# Patient Record
Sex: Female | Born: 1940 | Race: White | Hispanic: No | Marital: Married | Smoking: Never smoker
Health system: Southern US, Community
[De-identification: ages and names within clinical notes are randomized; demographics above are authoritative.]

## PROBLEM LIST (undated history)

## (undated) DIAGNOSIS — M5126 Other intervertebral disc displacement, lumbar region: Secondary | ICD-10-CM

## (undated) DIAGNOSIS — E039 Hypothyroidism, unspecified: Secondary | ICD-10-CM

## (undated) DIAGNOSIS — M1990 Unspecified osteoarthritis, unspecified site: Secondary | ICD-10-CM

## (undated) DIAGNOSIS — M8580 Other specified disorders of bone density and structure, unspecified site: Secondary | ICD-10-CM

## (undated) DIAGNOSIS — D649 Anemia, unspecified: Secondary | ICD-10-CM

## (undated) DIAGNOSIS — M0520 Rheumatoid vasculitis with rheumatoid arthritis of unspecified site: Secondary | ICD-10-CM

## (undated) DIAGNOSIS — K219 Gastro-esophageal reflux disease without esophagitis: Secondary | ICD-10-CM

## (undated) DIAGNOSIS — J302 Other seasonal allergic rhinitis: Secondary | ICD-10-CM

## (undated) DIAGNOSIS — M797 Fibromyalgia: Secondary | ICD-10-CM

## (undated) DIAGNOSIS — M3500 Sicca syndrome, unspecified: Secondary | ICD-10-CM

## (undated) DIAGNOSIS — M549 Dorsalgia, unspecified: Secondary | ICD-10-CM

## (undated) DIAGNOSIS — F419 Anxiety disorder, unspecified: Secondary | ICD-10-CM

## (undated) HISTORY — DX: Dorsalgia, unspecified: M54.9

## (undated) HISTORY — DX: Hypothyroidism, unspecified: E03.9

## (undated) HISTORY — DX: Sjogren syndrome, unspecified: M35.00

## (undated) HISTORY — DX: Rheumatoid vasculitis with rheumatoid arthritis of unspecified site: M05.20

## (undated) HISTORY — DX: Anxiety disorder, unspecified: F41.9

## (undated) HISTORY — PX: ABDOMINAL HYSTERECTOMY: SHX81

## (undated) HISTORY — DX: Other seasonal allergic rhinitis: J30.2

## (undated) HISTORY — DX: Other specified disorders of bone density and structure, unspecified site: M85.80

## (undated) HISTORY — DX: Unspecified osteoarthritis, unspecified site: M19.90

## (undated) HISTORY — DX: Gastro-esophageal reflux disease without esophagitis: K21.9

## (undated) HISTORY — DX: Anemia, unspecified: D64.9

## (undated) HISTORY — DX: Fibromyalgia: M79.7

## (undated) HISTORY — PX: BLADDER SURGERY: SHX569

## (undated) HISTORY — DX: Other intervertebral disc displacement, lumbar region: M51.26

---

## 1999-07-02 ENCOUNTER — Encounter: Payer: Self-pay | Admitting: Internal Medicine

## 1999-07-02 ENCOUNTER — Emergency Department (HOSPITAL_COMMUNITY): Admission: EM | Admit: 1999-07-02 | Discharge: 1999-07-02 | Payer: Self-pay | Admitting: Emergency Medicine

## 1999-08-31 ENCOUNTER — Ambulatory Visit (HOSPITAL_COMMUNITY): Admission: RE | Admit: 1999-08-31 | Discharge: 1999-08-31 | Payer: Self-pay | Admitting: Family Medicine

## 1999-08-31 ENCOUNTER — Encounter: Payer: Self-pay | Admitting: Family Medicine

## 1999-10-04 ENCOUNTER — Ambulatory Visit (HOSPITAL_COMMUNITY): Admission: RE | Admit: 1999-10-04 | Discharge: 1999-10-04 | Payer: Self-pay | Admitting: Gastroenterology

## 2000-02-01 ENCOUNTER — Other Ambulatory Visit: Admission: RE | Admit: 2000-02-01 | Discharge: 2000-02-01 | Payer: Self-pay | Admitting: Gynecology

## 2004-03-16 ENCOUNTER — Other Ambulatory Visit: Admission: RE | Admit: 2004-03-16 | Discharge: 2004-03-16 | Payer: Self-pay | Admitting: Gynecology

## 2006-07-25 ENCOUNTER — Inpatient Hospital Stay (HOSPITAL_COMMUNITY): Admission: RE | Admit: 2006-07-25 | Discharge: 2006-07-26 | Payer: Self-pay | Admitting: Obstetrics and Gynecology

## 2006-08-23 ENCOUNTER — Inpatient Hospital Stay (HOSPITAL_COMMUNITY): Admission: AD | Admit: 2006-08-23 | Discharge: 2006-08-23 | Payer: Self-pay | Admitting: Obstetrics and Gynecology

## 2011-07-06 DIAGNOSIS — M069 Rheumatoid arthritis, unspecified: Secondary | ICD-10-CM | POA: Diagnosis not present

## 2011-07-06 DIAGNOSIS — M3500 Sicca syndrome, unspecified: Secondary | ICD-10-CM | POA: Diagnosis not present

## 2011-07-06 DIAGNOSIS — I7300 Raynaud's syndrome without gangrene: Secondary | ICD-10-CM | POA: Diagnosis not present

## 2011-07-06 DIAGNOSIS — M25569 Pain in unspecified knee: Secondary | ICD-10-CM | POA: Diagnosis not present

## 2011-07-06 DIAGNOSIS — Z79899 Other long term (current) drug therapy: Secondary | ICD-10-CM | POA: Diagnosis not present

## 2011-11-10 DIAGNOSIS — M3500 Sicca syndrome, unspecified: Secondary | ICD-10-CM | POA: Diagnosis not present

## 2011-11-10 DIAGNOSIS — M069 Rheumatoid arthritis, unspecified: Secondary | ICD-10-CM | POA: Diagnosis not present

## 2011-11-10 DIAGNOSIS — M25569 Pain in unspecified knee: Secondary | ICD-10-CM | POA: Diagnosis not present

## 2011-11-10 DIAGNOSIS — I7300 Raynaud's syndrome without gangrene: Secondary | ICD-10-CM | POA: Diagnosis not present

## 2011-12-15 DIAGNOSIS — E039 Hypothyroidism, unspecified: Secondary | ICD-10-CM | POA: Diagnosis not present

## 2011-12-15 DIAGNOSIS — J45909 Unspecified asthma, uncomplicated: Secondary | ICD-10-CM | POA: Diagnosis not present

## 2011-12-15 DIAGNOSIS — F411 Generalized anxiety disorder: Secondary | ICD-10-CM | POA: Diagnosis not present

## 2012-01-16 DIAGNOSIS — Z1212 Encounter for screening for malignant neoplasm of rectum: Secondary | ICD-10-CM | POA: Diagnosis not present

## 2012-01-16 DIAGNOSIS — N952 Postmenopausal atrophic vaginitis: Secondary | ICD-10-CM | POA: Diagnosis not present

## 2012-01-16 DIAGNOSIS — N951 Menopausal and female climacteric states: Secondary | ICD-10-CM | POA: Diagnosis not present

## 2012-01-17 DIAGNOSIS — M069 Rheumatoid arthritis, unspecified: Secondary | ICD-10-CM | POA: Diagnosis not present

## 2012-03-15 DIAGNOSIS — H5710 Ocular pain, unspecified eye: Secondary | ICD-10-CM | POA: Diagnosis not present

## 2012-03-15 DIAGNOSIS — Z23 Encounter for immunization: Secondary | ICD-10-CM | POA: Diagnosis not present

## 2012-03-15 DIAGNOSIS — M316 Other giant cell arteritis: Secondary | ICD-10-CM | POA: Diagnosis not present

## 2012-03-20 DIAGNOSIS — H2000 Unspecified acute and subacute iridocyclitis: Secondary | ICD-10-CM | POA: Diagnosis not present

## 2012-04-03 DIAGNOSIS — H16009 Unspecified corneal ulcer, unspecified eye: Secondary | ICD-10-CM | POA: Diagnosis not present

## 2012-04-06 DIAGNOSIS — H16009 Unspecified corneal ulcer, unspecified eye: Secondary | ICD-10-CM | POA: Diagnosis not present

## 2012-06-04 DIAGNOSIS — Z79899 Other long term (current) drug therapy: Secondary | ICD-10-CM | POA: Diagnosis not present

## 2012-06-15 DIAGNOSIS — G4700 Insomnia, unspecified: Secondary | ICD-10-CM | POA: Diagnosis not present

## 2012-06-15 DIAGNOSIS — R7309 Other abnormal glucose: Secondary | ICD-10-CM | POA: Diagnosis not present

## 2012-09-06 DIAGNOSIS — M3500 Sicca syndrome, unspecified: Secondary | ICD-10-CM | POA: Diagnosis not present

## 2012-09-06 DIAGNOSIS — M25569 Pain in unspecified knee: Secondary | ICD-10-CM | POA: Diagnosis not present

## 2012-09-06 DIAGNOSIS — M069 Rheumatoid arthritis, unspecified: Secondary | ICD-10-CM | POA: Diagnosis not present

## 2012-09-06 DIAGNOSIS — Z79899 Other long term (current) drug therapy: Secondary | ICD-10-CM | POA: Diagnosis not present

## 2012-09-06 DIAGNOSIS — Z09 Encounter for follow-up examination after completed treatment for conditions other than malignant neoplasm: Secondary | ICD-10-CM | POA: Diagnosis not present

## 2012-09-24 DIAGNOSIS — K089 Disorder of teeth and supporting structures, unspecified: Secondary | ICD-10-CM | POA: Diagnosis not present

## 2012-09-24 DIAGNOSIS — J329 Chronic sinusitis, unspecified: Secondary | ICD-10-CM | POA: Diagnosis not present

## 2012-09-24 DIAGNOSIS — R51 Headache: Secondary | ICD-10-CM | POA: Diagnosis not present

## 2012-09-28 DIAGNOSIS — Z79899 Other long term (current) drug therapy: Secondary | ICD-10-CM | POA: Diagnosis not present

## 2012-09-28 DIAGNOSIS — H04129 Dry eye syndrome of unspecified lacrimal gland: Secondary | ICD-10-CM | POA: Diagnosis not present

## 2012-09-28 DIAGNOSIS — M069 Rheumatoid arthritis, unspecified: Secondary | ICD-10-CM | POA: Diagnosis not present

## 2012-10-29 DIAGNOSIS — H2510 Age-related nuclear cataract, unspecified eye: Secondary | ICD-10-CM | POA: Diagnosis not present

## 2012-10-29 DIAGNOSIS — H16149 Punctate keratitis, unspecified eye: Secondary | ICD-10-CM | POA: Diagnosis not present

## 2012-10-29 DIAGNOSIS — H18459 Nodular corneal degeneration, unspecified eye: Secondary | ICD-10-CM | POA: Diagnosis not present

## 2012-10-29 DIAGNOSIS — H01009 Unspecified blepharitis unspecified eye, unspecified eyelid: Secondary | ICD-10-CM | POA: Diagnosis not present

## 2013-01-03 DIAGNOSIS — J209 Acute bronchitis, unspecified: Secondary | ICD-10-CM | POA: Diagnosis not present

## 2013-01-03 DIAGNOSIS — E039 Hypothyroidism, unspecified: Secondary | ICD-10-CM | POA: Diagnosis not present

## 2013-01-03 DIAGNOSIS — G4700 Insomnia, unspecified: Secondary | ICD-10-CM | POA: Diagnosis not present

## 2013-03-07 DIAGNOSIS — M3500 Sicca syndrome, unspecified: Secondary | ICD-10-CM | POA: Diagnosis not present

## 2013-03-07 DIAGNOSIS — Z79899 Other long term (current) drug therapy: Secondary | ICD-10-CM | POA: Diagnosis not present

## 2013-03-07 DIAGNOSIS — M069 Rheumatoid arthritis, unspecified: Secondary | ICD-10-CM | POA: Diagnosis not present

## 2013-03-07 DIAGNOSIS — M25569 Pain in unspecified knee: Secondary | ICD-10-CM | POA: Diagnosis not present

## 2013-03-07 DIAGNOSIS — M7660 Achilles tendinitis, unspecified leg: Secondary | ICD-10-CM | POA: Diagnosis not present

## 2013-05-16 DIAGNOSIS — Z79899 Other long term (current) drug therapy: Secondary | ICD-10-CM | POA: Diagnosis not present

## 2013-05-16 DIAGNOSIS — E039 Hypothyroidism, unspecified: Secondary | ICD-10-CM | POA: Diagnosis not present

## 2013-05-16 DIAGNOSIS — Z23 Encounter for immunization: Secondary | ICD-10-CM | POA: Diagnosis not present

## 2013-07-11 DIAGNOSIS — Z1212 Encounter for screening for malignant neoplasm of rectum: Secondary | ICD-10-CM | POA: Diagnosis not present

## 2013-07-11 DIAGNOSIS — Z1289 Encounter for screening for malignant neoplasm of other sites: Secondary | ICD-10-CM | POA: Diagnosis not present

## 2013-08-21 DIAGNOSIS — F411 Generalized anxiety disorder: Secondary | ICD-10-CM | POA: Diagnosis not present

## 2013-08-21 DIAGNOSIS — E559 Vitamin D deficiency, unspecified: Secondary | ICD-10-CM | POA: Diagnosis not present

## 2013-08-21 DIAGNOSIS — K2100 Gastro-esophageal reflux disease with esophagitis, without bleeding: Secondary | ICD-10-CM | POA: Diagnosis not present

## 2013-08-21 DIAGNOSIS — E039 Hypothyroidism, unspecified: Secondary | ICD-10-CM | POA: Diagnosis not present

## 2013-08-21 DIAGNOSIS — J45909 Unspecified asthma, uncomplicated: Secondary | ICD-10-CM | POA: Diagnosis not present

## 2013-09-12 DIAGNOSIS — L570 Actinic keratosis: Secondary | ICD-10-CM | POA: Diagnosis not present

## 2013-09-12 DIAGNOSIS — D1739 Benign lipomatous neoplasm of skin and subcutaneous tissue of other sites: Secondary | ICD-10-CM | POA: Diagnosis not present

## 2013-09-12 DIAGNOSIS — L821 Other seborrheic keratosis: Secondary | ICD-10-CM | POA: Diagnosis not present

## 2013-12-23 DIAGNOSIS — S8010XA Contusion of unspecified lower leg, initial encounter: Secondary | ICD-10-CM | POA: Diagnosis not present

## 2014-02-12 DIAGNOSIS — E782 Mixed hyperlipidemia: Secondary | ICD-10-CM | POA: Diagnosis not present

## 2014-02-12 DIAGNOSIS — E559 Vitamin D deficiency, unspecified: Secondary | ICD-10-CM | POA: Diagnosis not present

## 2014-02-12 DIAGNOSIS — E039 Hypothyroidism, unspecified: Secondary | ICD-10-CM | POA: Diagnosis not present

## 2014-02-12 DIAGNOSIS — Z79899 Other long term (current) drug therapy: Secondary | ICD-10-CM | POA: Diagnosis not present

## 2014-02-14 DIAGNOSIS — F411 Generalized anxiety disorder: Secondary | ICD-10-CM | POA: Diagnosis not present

## 2014-02-14 DIAGNOSIS — J45909 Unspecified asthma, uncomplicated: Secondary | ICD-10-CM | POA: Diagnosis not present

## 2014-02-14 DIAGNOSIS — K219 Gastro-esophageal reflux disease without esophagitis: Secondary | ICD-10-CM | POA: Diagnosis not present

## 2014-02-14 DIAGNOSIS — E039 Hypothyroidism, unspecified: Secondary | ICD-10-CM | POA: Diagnosis not present

## 2014-03-27 DIAGNOSIS — H11009 Unspecified pterygium of unspecified eye: Secondary | ICD-10-CM | POA: Diagnosis not present

## 2014-03-27 DIAGNOSIS — Z09 Encounter for follow-up examination after completed treatment for conditions other than malignant neoplasm: Secondary | ICD-10-CM | POA: Diagnosis not present

## 2014-03-27 DIAGNOSIS — H2510 Age-related nuclear cataract, unspecified eye: Secondary | ICD-10-CM | POA: Diagnosis not present

## 2014-03-27 DIAGNOSIS — Z049 Encounter for examination and observation for unspecified reason: Secondary | ICD-10-CM | POA: Diagnosis not present

## 2014-03-27 DIAGNOSIS — M069 Rheumatoid arthritis, unspecified: Secondary | ICD-10-CM | POA: Diagnosis not present

## 2014-04-30 DIAGNOSIS — Z23 Encounter for immunization: Secondary | ICD-10-CM | POA: Diagnosis not present

## 2014-07-31 DIAGNOSIS — M19041 Primary osteoarthritis, right hand: Secondary | ICD-10-CM | POA: Diagnosis not present

## 2014-07-31 DIAGNOSIS — R5381 Other malaise: Secondary | ICD-10-CM | POA: Diagnosis not present

## 2014-07-31 DIAGNOSIS — Z79899 Other long term (current) drug therapy: Secondary | ICD-10-CM | POA: Diagnosis not present

## 2014-07-31 DIAGNOSIS — R300 Dysuria: Secondary | ICD-10-CM | POA: Diagnosis not present

## 2014-07-31 DIAGNOSIS — M19071 Primary osteoarthritis, right ankle and foot: Secondary | ICD-10-CM | POA: Diagnosis not present

## 2014-07-31 DIAGNOSIS — M19072 Primary osteoarthritis, left ankle and foot: Secondary | ICD-10-CM | POA: Diagnosis not present

## 2014-07-31 DIAGNOSIS — M2550 Pain in unspecified joint: Secondary | ICD-10-CM | POA: Diagnosis not present

## 2014-07-31 DIAGNOSIS — M069 Rheumatoid arthritis, unspecified: Secondary | ICD-10-CM | POA: Diagnosis not present

## 2014-07-31 DIAGNOSIS — M19042 Primary osteoarthritis, left hand: Secondary | ICD-10-CM | POA: Diagnosis not present

## 2014-07-31 DIAGNOSIS — E559 Vitamin D deficiency, unspecified: Secondary | ICD-10-CM | POA: Diagnosis not present

## 2014-07-31 DIAGNOSIS — M3500 Sicca syndrome, unspecified: Secondary | ICD-10-CM | POA: Diagnosis not present

## 2014-09-09 DIAGNOSIS — E782 Mixed hyperlipidemia: Secondary | ICD-10-CM | POA: Diagnosis not present

## 2014-09-09 DIAGNOSIS — Z79899 Other long term (current) drug therapy: Secondary | ICD-10-CM | POA: Diagnosis not present

## 2014-11-07 DIAGNOSIS — M069 Rheumatoid arthritis, unspecified: Secondary | ICD-10-CM | POA: Diagnosis not present

## 2014-11-07 DIAGNOSIS — J4530 Mild persistent asthma, uncomplicated: Secondary | ICD-10-CM | POA: Diagnosis not present

## 2014-11-07 DIAGNOSIS — Z23 Encounter for immunization: Secondary | ICD-10-CM | POA: Diagnosis not present

## 2014-11-07 DIAGNOSIS — M3500 Sicca syndrome, unspecified: Secondary | ICD-10-CM | POA: Diagnosis not present

## 2014-11-07 DIAGNOSIS — Z0000 Encounter for general adult medical examination without abnormal findings: Secondary | ICD-10-CM | POA: Diagnosis not present

## 2014-11-07 DIAGNOSIS — I7300 Raynaud's syndrome without gangrene: Secondary | ICD-10-CM | POA: Diagnosis not present

## 2014-11-07 DIAGNOSIS — E1165 Type 2 diabetes mellitus with hyperglycemia: Secondary | ICD-10-CM | POA: Diagnosis not present

## 2015-01-01 DIAGNOSIS — M0579 Rheumatoid arthritis with rheumatoid factor of multiple sites without organ or systems involvement: Secondary | ICD-10-CM | POA: Diagnosis not present

## 2015-01-01 DIAGNOSIS — M797 Fibromyalgia: Secondary | ICD-10-CM | POA: Diagnosis not present

## 2015-01-01 DIAGNOSIS — Z09 Encounter for follow-up examination after completed treatment for conditions other than malignant neoplasm: Secondary | ICD-10-CM | POA: Diagnosis not present

## 2015-01-01 DIAGNOSIS — Z79899 Other long term (current) drug therapy: Secondary | ICD-10-CM | POA: Diagnosis not present

## 2015-01-01 DIAGNOSIS — M3500 Sicca syndrome, unspecified: Secondary | ICD-10-CM | POA: Diagnosis not present

## 2015-02-18 DIAGNOSIS — Z09 Encounter for follow-up examination after completed treatment for conditions other than malignant neoplasm: Secondary | ICD-10-CM | POA: Diagnosis not present

## 2015-02-18 DIAGNOSIS — Z049 Encounter for examination and observation for unspecified reason: Secondary | ICD-10-CM | POA: Diagnosis not present

## 2015-02-18 DIAGNOSIS — H1840 Unspecified corneal degeneration: Secondary | ICD-10-CM | POA: Diagnosis not present

## 2015-02-18 DIAGNOSIS — H18792 Other corneal deformities, left eye: Secondary | ICD-10-CM | POA: Diagnosis not present

## 2015-02-18 DIAGNOSIS — H2513 Age-related nuclear cataract, bilateral: Secondary | ICD-10-CM | POA: Diagnosis not present

## 2015-02-18 DIAGNOSIS — M0540 Rheumatoid myopathy with rheumatoid arthritis of unspecified site: Secondary | ICD-10-CM | POA: Diagnosis not present

## 2015-02-18 DIAGNOSIS — H04123 Dry eye syndrome of bilateral lacrimal glands: Secondary | ICD-10-CM | POA: Diagnosis not present

## 2015-04-23 DIAGNOSIS — Z23 Encounter for immunization: Secondary | ICD-10-CM | POA: Diagnosis not present

## 2015-06-04 DIAGNOSIS — Z79899 Other long term (current) drug therapy: Secondary | ICD-10-CM | POA: Diagnosis not present

## 2015-06-04 DIAGNOSIS — R5381 Other malaise: Secondary | ICD-10-CM | POA: Diagnosis not present

## 2015-06-04 DIAGNOSIS — R300 Dysuria: Secondary | ICD-10-CM | POA: Diagnosis not present

## 2015-06-04 DIAGNOSIS — M1712 Unilateral primary osteoarthritis, left knee: Secondary | ICD-10-CM | POA: Diagnosis not present

## 2015-06-04 DIAGNOSIS — R531 Weakness: Secondary | ICD-10-CM | POA: Diagnosis not present

## 2015-06-23 DIAGNOSIS — E039 Hypothyroidism, unspecified: Secondary | ICD-10-CM | POA: Diagnosis not present

## 2015-06-23 DIAGNOSIS — K219 Gastro-esophageal reflux disease without esophagitis: Secondary | ICD-10-CM | POA: Diagnosis not present

## 2015-06-23 DIAGNOSIS — G4700 Insomnia, unspecified: Secondary | ICD-10-CM | POA: Diagnosis not present

## 2015-06-23 DIAGNOSIS — F419 Anxiety disorder, unspecified: Secondary | ICD-10-CM | POA: Diagnosis not present

## 2015-07-08 DIAGNOSIS — H18453 Nodular corneal degeneration, bilateral: Secondary | ICD-10-CM | POA: Diagnosis not present

## 2015-07-08 DIAGNOSIS — H17822 Peripheral opacity of cornea, left eye: Secondary | ICD-10-CM | POA: Diagnosis not present

## 2015-07-08 DIAGNOSIS — H04123 Dry eye syndrome of bilateral lacrimal glands: Secondary | ICD-10-CM | POA: Diagnosis not present

## 2015-07-08 DIAGNOSIS — H2513 Age-related nuclear cataract, bilateral: Secondary | ICD-10-CM | POA: Diagnosis not present

## 2015-08-13 DIAGNOSIS — L820 Inflamed seborrheic keratosis: Secondary | ICD-10-CM | POA: Diagnosis not present

## 2015-08-13 DIAGNOSIS — L814 Other melanin hyperpigmentation: Secondary | ICD-10-CM | POA: Diagnosis not present

## 2015-08-13 DIAGNOSIS — D1801 Hemangioma of skin and subcutaneous tissue: Secondary | ICD-10-CM | POA: Diagnosis not present

## 2015-08-14 DIAGNOSIS — H18459 Nodular corneal degeneration, unspecified eye: Secondary | ICD-10-CM | POA: Diagnosis not present

## 2015-09-11 DIAGNOSIS — H18453 Nodular corneal degeneration, bilateral: Secondary | ICD-10-CM | POA: Diagnosis not present

## 2015-09-11 DIAGNOSIS — H01003 Unspecified blepharitis right eye, unspecified eyelid: Secondary | ICD-10-CM | POA: Diagnosis not present

## 2015-09-11 DIAGNOSIS — H25813 Combined forms of age-related cataract, bilateral: Secondary | ICD-10-CM | POA: Diagnosis not present

## 2015-09-11 DIAGNOSIS — H01006 Unspecified blepharitis left eye, unspecified eyelid: Secondary | ICD-10-CM | POA: Diagnosis not present

## 2015-09-11 DIAGNOSIS — H16121 Filamentary keratitis, right eye: Secondary | ICD-10-CM | POA: Diagnosis not present

## 2015-09-11 DIAGNOSIS — H04123 Dry eye syndrome of bilateral lacrimal glands: Secondary | ICD-10-CM | POA: Diagnosis not present

## 2015-11-11 DIAGNOSIS — M7661 Achilles tendinitis, right leg: Secondary | ICD-10-CM | POA: Diagnosis not present

## 2015-11-11 DIAGNOSIS — M0579 Rheumatoid arthritis with rheumatoid factor of multiple sites without organ or systems involvement: Secondary | ICD-10-CM | POA: Diagnosis not present

## 2015-11-11 DIAGNOSIS — Z09 Encounter for follow-up examination after completed treatment for conditions other than malignant neoplasm: Secondary | ICD-10-CM | POA: Diagnosis not present

## 2015-12-03 DIAGNOSIS — Z0000 Encounter for general adult medical examination without abnormal findings: Secondary | ICD-10-CM | POA: Diagnosis not present

## 2015-12-03 DIAGNOSIS — Z23 Encounter for immunization: Secondary | ICD-10-CM | POA: Diagnosis not present

## 2015-12-03 DIAGNOSIS — M069 Rheumatoid arthritis, unspecified: Secondary | ICD-10-CM | POA: Diagnosis not present

## 2016-01-11 DIAGNOSIS — N3091 Cystitis, unspecified with hematuria: Secondary | ICD-10-CM | POA: Diagnosis not present

## 2016-01-14 DIAGNOSIS — Z09 Encounter for follow-up examination after completed treatment for conditions other than malignant neoplasm: Secondary | ICD-10-CM | POA: Diagnosis not present

## 2016-01-14 DIAGNOSIS — M0579 Rheumatoid arthritis with rheumatoid factor of multiple sites without organ or systems involvement: Secondary | ICD-10-CM | POA: Diagnosis not present

## 2016-01-14 DIAGNOSIS — Z79899 Other long term (current) drug therapy: Secondary | ICD-10-CM | POA: Diagnosis not present

## 2016-01-14 DIAGNOSIS — M3509 Sicca syndrome with other organ involvement: Secondary | ICD-10-CM | POA: Diagnosis not present

## 2016-01-14 DIAGNOSIS — M79641 Pain in right hand: Secondary | ICD-10-CM | POA: Diagnosis not present

## 2016-01-18 DIAGNOSIS — R300 Dysuria: Secondary | ICD-10-CM | POA: Diagnosis not present

## 2016-01-18 DIAGNOSIS — N898 Other specified noninflammatory disorders of vagina: Secondary | ICD-10-CM | POA: Diagnosis not present

## 2016-01-18 DIAGNOSIS — N760 Acute vaginitis: Secondary | ICD-10-CM | POA: Diagnosis not present

## 2016-02-24 DIAGNOSIS — Z1231 Encounter for screening mammogram for malignant neoplasm of breast: Secondary | ICD-10-CM | POA: Diagnosis not present

## 2016-02-24 DIAGNOSIS — N905 Atrophy of vulva: Secondary | ICD-10-CM | POA: Diagnosis not present

## 2016-03-10 DIAGNOSIS — Z79899 Other long term (current) drug therapy: Secondary | ICD-10-CM | POA: Diagnosis not present

## 2016-03-10 DIAGNOSIS — I7300 Raynaud's syndrome without gangrene: Secondary | ICD-10-CM | POA: Diagnosis not present

## 2016-03-10 DIAGNOSIS — M797 Fibromyalgia: Secondary | ICD-10-CM | POA: Diagnosis not present

## 2016-03-10 DIAGNOSIS — M3509 Sicca syndrome with other organ involvement: Secondary | ICD-10-CM | POA: Diagnosis not present

## 2016-03-10 DIAGNOSIS — G629 Polyneuropathy, unspecified: Secondary | ICD-10-CM | POA: Diagnosis not present

## 2016-05-08 NOTE — Progress Notes (Deleted)
*IMAGE* Office Visit Note  Patient: Brenda Hanna             Date of Birth: 04-08-1941           MRN: [id number]             PCP: No primary care provider on file. Referring: No ref. provider found Visit Date: 05/12/2016 Occupation:@GUAROCC @    Subjective:  No chief complaint on file. Last visit Visit Date: 03/10/2016 ==>CHIEF COMPLAINT:  Followup on rheumatoid arthritis and high-risk prescription.  History of Present Illness: Brenda Hanna is a 75 y.o. female    Last visit Visit Date: 03/10/2016 ==>HISTORY OF PRESENT ILLNESS:  The patient was last seen in our office on January 14, 2016.  The patient presents today because she has stopped using the [place].  Jolee Ewing was given because she was having synovitis on the last visit in July.  Plaquenil alone was not working.  Unfortunately, she was not able to tolerate the [place].  Specifically she states that "I had nausea, and I felt bad, and then I also had a rash on my neck and my back even when I used Benadryl.  When I stopped the medication, the rash went away, and then I tried the medicine again, and the rash came back".  She did not have trouble swallowing, and she did not have any compromise of her airway.  No shortness of breath and no wheezing.   She continues to have bilateral hand and wrist joint pain and swelling, bilateral knee joint pain, and bilateral foot and ankle joint pain and swelling.  She also has fibromyalgia, and she rates that discomfort between 5 and 8 on a scale of 0 to 10.  A Raynaud's flare comes off and on even during the Summer time.   Dr. Estanislado Pandy offered her Maureen Chatters on the last visit and advised that if she does not do well on today's visit, we will consider starting her on Orencia if she is agreeable.  Today, the patient is apprehensive about starting Orencia and has many questions regarding the medication, her disease process, the risks and benefits of using versus not using, and the possibility of the fibromyalgia being  the source of most of her pain.   She also has bilateral feet neuropathy for which her family doctor wants to give her Neurontin or tramadol.  She is asking [place] what our opinion is.  I advised her that for the neuropathy, the gabapentin/Neurontin would  be a better option versus tramadol.  It would also help her fibromyalgia better.  Also, tramadol may have an addictive nature, and it would be best to minimize the use of that or avoid it altogether, if possible.  She is agreeable with this advice, and she plans to move forward with her family doctor asking for the Neurontin.  Last visit Visit Date: 03/10/2016 ==>IMPRESSION/PLAN:   1.  Rheumatoid arthritis with positive rheumatoid factor and negative CCP.  No synovitis on examination today.  The patient is complaining of bilateral hand pain and bilateral feet pain.  We believe the feet pain probably is from neuropathy, but we are unsure of that.  It could be rheumatoid arthritis.  She is also having right fifth toe pain and bilateral Achilles tendinitis. 2.  High-risk prescription.  The patient stopped Arava 20 mg due a rash and nausea.  The patient is on Plaquenil 200 mg in the morning and 100 mg at night. 3.  Bilateral feet pain consistent with  neuropathy.  The patient wants to know whether she should use Neurontin versus tramadol, and we discussed the risks and benefits of both, and I advised her that it would be better for her to use the Neurontin if indeed for the neuropathy is in her feet, which is causing her a significant amount of discomfort.  Her main complaint today is the feet pain and feet discomfort, and I am not sure how much of that is neuropathy versus fibromyalgia versus rheumatoid arthritis. 4.  Bilateral knee joint pain off and on. 5.  Fibromyalgia syndrome, active disease with generalized pain and 18/18 positive tender points.  The patient describes her discomfort as between 5 and 8. 6.  Sjogren's with positive ANA and positive Ro with  sicca symptoms. 7.  Raynaud's off and on.  Her fingers and toes do turn blue, red, and white even during the Summertime. 8.  Achilles tendinitis bilaterally. 9.  Plan:  Neurontin via primary care physician and if no better with the feet pain, the patient will call us, and she will let us know if she wishes to start the [place], and she will do the appropriate blood work.  Note that the blood work that the patient needs to start the [place] will be TB-Gold, HIV, immunoglobulins, SPEP, and hepatitis panel.  She will also need to do CBC with differential and CMP with GFR every 2 months after she starts the [place].  We have the July 2017 labs, and the next time she needs to repeat those will be in 5 months if she does not use Orencia but in 2 months if she starts [place]. 10.  Handout and consent on Orencia.  The patient has decided to take them home and read them and will let us know after she reads them if she wants to do it.  In the meanwhile, we will not apply for Orencia until she calls [place] and lets [place] know. 11.  Return to clinic in 2 months. 12.  Indications, contraindications, and side effects of medications were discussed with the patient at length. 13.  Time spent with the patient was 60 minutes with over 50% of the time spent on discussing the risks and benefits of fibromyalgia, rheumatoid arthritis, neuropathy, and lack of medications.  The patient is very apprehensive about all of these factors and wishes to think about all of these things and then let us know her decision in the future.  ***  Activities of Daily Living:  Patient reports morning stiffness for 15 minute.   Patient Denies nocturnal pain.  Difficulty dressing/grooming: Denies Difficulty climbing stairs: Reports Difficulty getting out of chair: Reports Difficulty using hands for taps, buttons, cutlery, and/or writing: Reports   No Rheumatology ROS completed.  Last visit Visit Date: 03/10/2016 ==>REVIEW OF SYSTEMS:  Systemic  review is significant for pain and tenderness, disturbed sleep pattern, and fatigue.  She has some dry eyes, some dry mouth, and some Raynaud's.  No mucosal ulcers and no lymphadenopathy.  She did have the rash when she was taking the [place], but that is all resolved now.  No photosensitivity.  No diarrhea.  No constipation.  She had some nausea when she was taking the [place] but none now.  She has some morning stiffness and occasional trouble with stairs, chair, car, and toilet as well as tops, buttons, cutlery, and writing.  The rest of the systems are negative.  Please refer to the Rheumatology Visit Form for details.    PMFS History:  There are no active problems to display for this patient.   No past medical history on file.  No family history on file. No past surgical history on file. Social History   Social History Narrative  . No narrative on file   Last visit Visit Date: 03/10/2016 ==>SOCIAL HISTORY:  There is no change in her social history from the last visit.  The patient is a non-smoker, no alcohol use, no coffee, and exercises some.  Last visit Visit Date: 03/10/2016 ==>CURRENT MEDICATIONS:  Omeprazole, Zyrtec, ranitidine, Synthroid, Plaquenil, Aleve, albuterol, Arava which has been discontinued and was started in May 2017, and supplements.  Last visit Visit Date: 03/10/2016 ==>MEDICATION ALLERGIES:  X-RAY DYE, SHELLFISH, PENICILLIN, METHOTREXATE CAUSES A RASH, AND ARAVA CAUSES A RASH AND NAUSEA.   Objective: Vital Signs: There were no vitals taken for this visit.   Physical Exam   Musculoskeletal Exam:  *** Last visit Visit Date: 03/10/2016 ==>   CDAI Exam: No CDAI exam completed.    Investigation: Findings:   ==============   INVESTIGATIONS:  Labs from January 14, 2016 show CMP with GFR normal except for elevated creatinine at 1.0 and decreased GFR at 51.   ===============    Imaging: No results found.  Speciality Comments: No specialty comments  available.    Procedures:  No procedures performed Allergies: Patient has no allergy information on record.   Assessment / Plan: Visit Diagnoses: No diagnosis found.    Orders: No orders of the defined types were placed in this encounter.  No orders of the defined types were placed in this encounter.   Face-to-face time spent with patient was *** minutes. 50% of time was spent in counseling and coordination of care.  Follow-Up Instructions: No Follow-up on file.

## 2016-05-12 ENCOUNTER — Ambulatory Visit: Payer: Self-pay | Admitting: Rheumatology

## 2016-06-01 DIAGNOSIS — Z23 Encounter for immunization: Secondary | ICD-10-CM | POA: Diagnosis not present

## 2016-06-14 ENCOUNTER — Telehealth: Payer: Self-pay | Admitting: Rheumatology

## 2016-06-14 ENCOUNTER — Other Ambulatory Visit: Payer: Self-pay | Admitting: Rheumatology

## 2016-06-14 NOTE — Telephone Encounter (Signed)
Patient returned Brenda Hanna's call. Please call patient.  

## 2016-06-14 NOTE — Telephone Encounter (Signed)
Last Visit: 03/10/16 Next visit due November 2017 Labs: 01/15/16 decreased GFR PLQ Eye Exam: 02/13/15 WNL  Left message to remind patient she is due for PLQ eye exam.   Okay to refill 30 day supply PLQ?

## 2016-06-14 NOTE — Telephone Encounter (Signed)
Patient advised that she is due for a PLQ eye exam and is calling to get it scheduled. She will have results to faxed to our office.

## 2016-06-14 NOTE — Telephone Encounter (Signed)
We need Plaquenil eye exam done in the next 30 days

## 2016-06-14 NOTE — Telephone Encounter (Signed)
That she missed her appointment in November 2017?Please make appointment for January 2018

## 2016-06-16 ENCOUNTER — Telehealth: Payer: Self-pay | Admitting: Rheumatology

## 2016-06-16 NOTE — Telephone Encounter (Signed)
LMOM for patient to call back to schedule appointment.  

## 2016-06-16 NOTE — Telephone Encounter (Signed)
-----   Message from [name], LPN sent at [phone]  8:53 AM EST ----- Regarding: Please schedule patient for a follow up visit Please schedule patient for a follow up visit. Patient was due in November 2017. Thanks!

## 2016-07-13 NOTE — Progress Notes (Signed)
Office Visit Note  Patient: Brenda Hanna             Date of Birth: July 04, 1941           MRN: [id number]             PCP: No primary care provider on file. Referring: No ref. provider found Visit Date: 07/14/2016 Occupation: Fine arts teacher    Subjective:  Pain knees   History of Present Illness: Brenda Hanna is a 76 y.o. female with history of sero positive rheumatoid arthritis some. She states she's been experiencing some tingling in her mouth and tongue when she takes Plaquenil. She cuts down Plaquenil tablet into half and takes it 3 times a day. She had problems with Arava in the past with rash and nausea and she stopped [place]. She complains of increased pain in her bilateral shoulders bilateral knee joints and feet. She has noticed some swelling in her feet. She continues to have sicca symptoms and Raynaud's phenomenon.  Activities of Daily Living:  Patient reports morning stiffness for 45 minutes.   Patient Reports nocturnal pain.  Difficulty dressing/grooming: Reports Difficulty climbing stairs: Denies Difficulty getting out of chair: Denies Difficulty using hands for taps, buttons, cutlery, and/or writing: Denies   Review of Systems  Constitutional: Positive for fatigue. Negative for night sweats, weight gain, weight loss and weakness.  HENT: Positive for mouth dryness. Negative for mouth sores, trouble swallowing, trouble swallowing and nose dryness.   Eyes: Positive for dryness. Negative for pain, redness and visual disturbance.  Respiratory: Negative for cough, shortness of breath and difficulty breathing.   Cardiovascular: Negative for chest pain, palpitations, hypertension, irregular heartbeat and swelling in legs/feet.  Gastrointestinal: Negative for blood in stool, constipation and diarrhea.  Endocrine: Negative for increased urination.  Genitourinary: Negative for vaginal dryness.  Musculoskeletal: Positive for arthralgias, joint pain, joint swelling,  myalgias, morning stiffness and myalgias. Negative for muscle weakness and muscle tenderness.  Skin: Positive for color change. Negative for rash, hair loss, skin tightness, ulcers and sensitivity to sunlight.  Allergic/Immunologic: Negative for susceptible to infections.  Neurological: Negative for dizziness, memory loss and night sweats.  Hematological: Negative for swollen glands.  Psychiatric/Behavioral: Positive for sleep disturbance. Negative for depressed mood. The patient is not nervous/anxious.     PMFS History:  Patient Active Problem List   Diagnosis Date Noted  . Rheumatoid arthritis involving multiple sites with positive rheumatoid factor ([place]) 07/14/2016  . High risk medication use 07/14/2016  . Sjogren's syndrome with keratoconjunctivitis sicca ([place]) 07/14/2016  . Raynaud's disease without gangrene 07/14/2016  . Achilles tendinitis of both lower extremities 07/14/2016  . Fibromyalgia 07/14/2016  . Anxiety and depression 07/14/2016  . History of asthma 07/14/2016  . Environmental allergies 07/14/2016  . Gastroesophageal reflux disease without esophagitis 07/14/2016  . Neuropathy ([place]) 07/14/2016  . Osteopenia of multiple sites 07/14/2016  . Pain in both hands 07/14/2016  . Pain in both feet 07/14/2016    No past medical history on file.  No family history on file. No past surgical history on file. Social History   Social History Narrative  . No narrative on file     Objective: Vital Signs: BP 122/82   Pulse 76   Resp 14   Ht 5' 1.5" (1.562 m)   Wt 137 lb (62.1 kg)   BMI 25.47 kg/m    Physical Exam  Constitutional: She is oriented to person, place, and time. She appears well-developed and well-nourished.  HENT:  Head: Normocephalic and atraumatic.  Eyes: Conjunctivae and EOM are normal.  Neck: Normal range of motion.  Cardiovascular: Normal rate, regular rhythm, normal heart sounds and intact distal pulses.   Pulmonary/Chest: Effort normal and breath  sounds normal.  Abdominal: Soft. Bowel sounds are normal.  Lymphadenopathy:    She has no cervical adenopathy.  Neurological: She is alert and oriented to person, place, and time.  Skin: Skin is warm and dry. Capillary refill takes less than 2 seconds.  Psychiatric: She has a normal mood and affect. Her behavior is normal.  Nursing note and vitals reviewed.    Musculoskeletal Exam: C-spine and thoracic lumbar spine good range of motion with some discomfort and lumbar region. Shoulder joints elbow joints wrist joint MCPs PIPs DIPs with good range of motion. She has no synovitis over MCPs. Hip joints, knee joints, ankle joints, MTPs PIPs DIPs with good range of motion with no synovitis. Fibromyalgia tender points with 12 out of 18 positive.  CDAI Exam: CDAI Homunculus Exam:   Joint Counts:  CDAI Tender Joint count: 0 CDAI Swollen Joint count: 0  Global Assessments:  Patient Global Assessment: 4 Provider Global Assessment: 1  CDAI Calculated Score: 5    Investigation: Findings:  01/14/2016 CBC normal, CMP creatinine 1.08, GFR 51    Imaging: No results found.  Speciality Comments: No specialty comments available.    Procedures:  No procedures performed Allergies: Penicillins   Assessment / Plan:     Visit Diagnoses: Rheumatoid arthritis involving multiple sites with positive rheumatoid factor ([place]) -she has no synovitis on examination she still complains of arthralgia. Positive RF, negative CCP  High risk medication use - Plaquenil 200 mg a.m., 100 mg p.m., she complains of some paresthesias in her mouth for Plaquenil. I've advised her to reduce her Plaquenil to one a day. I will refill Plaquenil per her request today. She did not want me to decrease the dose on the refill at this point. (Arava 20 mg and discontinued due to rash and nausea) - Plan: hydroxychloroquine (PLAQUENIL) 200 MG tablet, CBC with Differential/Platelet, COMPLETE METABOLIC PANEL WITH GFR  Sjogren's  syndrome with keratoconjunctivitis sicca ([place]) -she continues to have sicca symptoms over-the-counter products or useful. Positive ANA, positive Ro, sicca - Plan: Rheumatoid factor, Serum protein electrophoresis with reflex, Urinalysis, Routine w reflex microscopic  Raynaud's disease without gangrene: Warm clothing was discussed.  Achilles tendinitis of both lower extremities: She has some thickening of Achilles tendon but no inflammation.  Pain in both hands: Joint protection and muscle strengthening discussed  Pain in both feet: Proper fitting shoes discussed  Fibromyalgia: She has generalized pain and discomfort and positive tender points graft she has following other medical problems for which she's been seeing by her PCP:  Anxiety and depression  History of asthma  Environmental allergies  Gastroesophageal reflux disease without esophagitis  Neuropathy ([place])  Osteopenia of multiple sites - December 2012 T score -2.1    Orders: Orders Placed This Encounter  Procedures  . CBC with Differential/Platelet  . COMPLETE METABOLIC PANEL WITH GFR  . Rheumatoid factor  . Serum protein electrophoresis with reflex  . Urinalysis, Routine w reflex microscopic   Meds ordered this encounter  Medications  . hydroxychloroquine (PLAQUENIL) 200 MG tablet    Sig: TAKE ONE TABLET BY MOUTH IN THE MORNING AND ONE-HALF TABLET AT BEDTIME.    Dispense:  45 tablet    Refill:  4    Face-to-face time spent with patient was 30  minutes. 50% of time was spent in counseling and coordination of care.  Follow-Up Instructions: Return in about 5 months (around 12/12/2016) for Rheumatoid arthritis.   Bo Merino, MD  Note - This record has been created using Editor, commissioning.  Chart creation errors have been sought, but may not always  have been located. Such creation errors do not reflect on  the standard of medical care.

## 2016-07-14 ENCOUNTER — Encounter: Payer: Self-pay | Admitting: Rheumatology

## 2016-07-14 ENCOUNTER — Ambulatory Visit (INDEPENDENT_AMBULATORY_CARE_PROVIDER_SITE_OTHER): Payer: PPO | Admitting: Rheumatology

## 2016-07-14 VITALS — BP 122/82 | HR 76 | Resp 14 | Ht 61.5 in | Wt 137.0 lb

## 2016-07-14 DIAGNOSIS — Z79899 Other long term (current) drug therapy: Secondary | ICD-10-CM | POA: Insufficient documentation

## 2016-07-14 DIAGNOSIS — F418 Other specified anxiety disorders: Secondary | ICD-10-CM

## 2016-07-14 DIAGNOSIS — Z9109 Other allergy status, other than to drugs and biological substances: Secondary | ICD-10-CM | POA: Diagnosis not present

## 2016-07-14 DIAGNOSIS — M7662 Achilles tendinitis, left leg: Secondary | ICD-10-CM

## 2016-07-14 DIAGNOSIS — M0579 Rheumatoid arthritis with rheumatoid factor of multiple sites without organ or systems involvement: Secondary | ICD-10-CM | POA: Insufficient documentation

## 2016-07-14 DIAGNOSIS — K219 Gastro-esophageal reflux disease without esophagitis: Secondary | ICD-10-CM | POA: Diagnosis not present

## 2016-07-14 DIAGNOSIS — M79641 Pain in right hand: Secondary | ICD-10-CM

## 2016-07-14 DIAGNOSIS — I7300 Raynaud's syndrome without gangrene: Secondary | ICD-10-CM | POA: Diagnosis not present

## 2016-07-14 DIAGNOSIS — F32A Depression, unspecified: Secondary | ICD-10-CM | POA: Insufficient documentation

## 2016-07-14 DIAGNOSIS — M79642 Pain in left hand: Secondary | ICD-10-CM

## 2016-07-14 DIAGNOSIS — Z8709 Personal history of other diseases of the respiratory system: Secondary | ICD-10-CM | POA: Diagnosis not present

## 2016-07-14 DIAGNOSIS — M8589 Other specified disorders of bone density and structure, multiple sites: Secondary | ICD-10-CM

## 2016-07-14 DIAGNOSIS — F329 Major depressive disorder, single episode, unspecified: Secondary | ICD-10-CM | POA: Insufficient documentation

## 2016-07-14 DIAGNOSIS — F419 Anxiety disorder, unspecified: Secondary | ICD-10-CM

## 2016-07-14 DIAGNOSIS — G629 Polyneuropathy, unspecified: Secondary | ICD-10-CM | POA: Diagnosis not present

## 2016-07-14 DIAGNOSIS — M7661 Achilles tendinitis, right leg: Secondary | ICD-10-CM | POA: Insufficient documentation

## 2016-07-14 DIAGNOSIS — M3501 Sicca syndrome with keratoconjunctivitis: Secondary | ICD-10-CM | POA: Insufficient documentation

## 2016-07-14 DIAGNOSIS — M79672 Pain in left foot: Secondary | ICD-10-CM

## 2016-07-14 DIAGNOSIS — M797 Fibromyalgia: Secondary | ICD-10-CM | POA: Diagnosis not present

## 2016-07-14 DIAGNOSIS — M79671 Pain in right foot: Secondary | ICD-10-CM

## 2016-07-14 LAB — CBC WITH DIFFERENTIAL/PLATELET
BASOS PCT: 1 %
Basophils Absolute: 63 cells/uL (ref 0–200)
EOS PCT: 5 %
Eosinophils Absolute: 315 cells/uL (ref 15–500)
HCT: 36.9 % (ref 35.0–45.0)
Hemoglobin: 11.9 g/dL (ref 11.7–15.5)
LYMPHS PCT: 29 %
Lymphs Abs: 1827 cells/uL (ref 850–3900)
MCH: 31.2 pg (ref 27.0–33.0)
MCHC: 32.2 g/dL (ref 32.0–36.0)
MCV: 96.9 fL (ref 80.0–100.0)
MONOS PCT: 9 %
MPV: 10.3 fL (ref 7.5–12.5)
Monocytes Absolute: 567 cells/uL (ref 200–950)
Neutro Abs: 3528 cells/uL (ref 1500–7800)
Neutrophils Relative %: 56 %
PLATELETS: 316 10*3/uL (ref 140–400)
RBC: 3.81 MIL/uL (ref 3.80–5.10)
RDW: 13.6 % (ref 11.0–15.0)
WBC: 6.3 10*3/uL (ref 3.8–10.8)

## 2016-07-14 MED ORDER — HYDROXYCHLOROQUINE SULFATE 200 MG PO TABS: ORAL_TABLET | ORAL | 4 refills | Status: DC

## 2016-07-14 NOTE — Progress Notes (Signed)
Rheumatology Medication Review by a Pharmacist Does the patient feel that his/her medications are working for him/her?  Yes Does the patient have any problems obtaining medications?  No  Issues to address at subsequent visits: None   Pharmacist comments:  Brenda Hanna is a pleasant 76 yo F who presents for follow up of rheumatoid arthritis.  She is currently taking hyroxychloroquine 100 mg three times daily.  Her Hanna recent standing labs were on 01/14/16.  At that time, CBC was normal, CMP showed Cr 1.08, GFR 51.  Patient is due for labs at this time.  She reports her Hanna recent eye exam was in September 2017 with Dr. Kizzie Hanna.  She reports the hydroxychloroquine eye exam was normal.  Will request these results from Dr. Delene Hanna office.  She reports she is also scheduled for eye exam with Dr. Katy Hanna in February 2018.     Brenda Hanna, Pharm.D., BCPS Clinical Pharmacist Pager: [phone] Phone: [phone] 07/14/2016 2:32 PM

## 2016-07-15 LAB — COMPLETE METABOLIC PANEL WITH GFR
ALT: 15 U/L (ref 6–29)
AST: 25 U/L (ref 10–35)
Albumin: 4.3 g/dL (ref 3.6–5.1)
Alkaline Phosphatase: 89 U/L (ref 33–130)
BUN: 13 mg/dL (ref 7–25)
CO2: 26 mmol/L (ref 20–31)
CREATININE: 1.04 mg/dL — AB (ref 0.60–0.93)
Calcium: 9.5 mg/dL (ref 8.6–10.4)
Chloride: 102 mmol/L (ref 98–110)
GFR, EST AFRICAN AMERICAN: 62 mL/min (ref >=60)
GFR, Est Non African American: 53 mL/min — ABNORMAL LOW (ref >=60)
Glucose, Bld: 93 mg/dL (ref 65–99)
Potassium: 4.9 mmol/L (ref 3.5–5.3)
Sodium: 140 mmol/L (ref 135–146)
Total Bilirubin: 0.4 mg/dL (ref 0.2–1.2)
Total Protein: 7 g/dL (ref 6.1–8.1)

## 2016-07-15 LAB — URINALYSIS, MICROSCOPIC ONLY
CRYSTALS: NONE SEEN [HPF]
Casts: NONE SEEN [LPF]
Yeast: NONE SEEN [HPF]

## 2016-07-15 LAB — URINALYSIS, ROUTINE W REFLEX MICROSCOPIC
BILIRUBIN URINE: NEGATIVE
Glucose, UA: NEGATIVE
Hgb urine dipstick: NEGATIVE
Ketones, ur: NEGATIVE
NITRITE: NEGATIVE
Protein, ur: NEGATIVE
SPECIFIC GRAVITY, URINE: 1.006 (ref 1.001–1.035)
pH: 5.5 (ref 5.0–8.0)

## 2016-07-15 LAB — RHEUMATOID FACTOR: Rhuematoid fact SerPl-aCnc: 536 IU/mL — ABNORMAL HIGH (ref <14)

## 2016-07-18 LAB — PROTEIN ELECTROPHORESIS, SERUM, WITH REFLEX
ALBUMIN ELP: 4.2 g/dL (ref 3.8–4.8)
ALPHA-1-GLOBULIN: 0.3 g/dL (ref 0.2–0.3)
Alpha-2-Globulin: 0.7 g/dL (ref 0.5–0.9)
BETA 2: 0.3 g/dL (ref 0.2–0.5)
Beta Globulin: 0.5 g/dL (ref 0.4–0.6)
Gamma Globulin: 1 g/dL (ref 0.8–1.7)
TOTAL PROTEIN, SERUM ELECTROPHOR: 7 g/dL (ref 6.1–8.1)

## 2016-07-19 NOTE — Progress Notes (Signed)
Labs are stable.

## 2016-07-25 ENCOUNTER — Telehealth: Payer: Self-pay | Admitting: Radiology

## 2016-07-25 NOTE — Telephone Encounter (Signed)
-----   Message from Bo Merino, MD sent at 07/19/2016  1:22 PM EST ----- Labs are stable

## 2016-07-26 NOTE — Telephone Encounter (Signed)
Left message to advise patient labs are stable.

## 2016-07-26 NOTE — Telephone Encounter (Signed)
Called to advise, but line is busy

## 2016-08-10 DIAGNOSIS — H04123 Dry eye syndrome of bilateral lacrimal glands: Secondary | ICD-10-CM | POA: Diagnosis not present

## 2016-08-10 DIAGNOSIS — Z09 Encounter for follow-up examination after completed treatment for conditions other than malignant neoplasm: Secondary | ICD-10-CM | POA: Diagnosis not present

## 2016-08-10 DIAGNOSIS — H2513 Age-related nuclear cataract, bilateral: Secondary | ICD-10-CM | POA: Diagnosis not present

## 2016-08-10 DIAGNOSIS — M0579 Rheumatoid arthritis with rheumatoid factor of multiple sites without organ or systems involvement: Secondary | ICD-10-CM | POA: Diagnosis not present

## 2016-08-10 DIAGNOSIS — H11002 Unspecified pterygium of left eye: Secondary | ICD-10-CM | POA: Diagnosis not present

## 2016-08-10 DIAGNOSIS — Z79899 Other long term (current) drug therapy: Secondary | ICD-10-CM | POA: Diagnosis not present

## 2016-09-07 DIAGNOSIS — M859 Disorder of bone density and structure, unspecified: Secondary | ICD-10-CM | POA: Diagnosis not present

## 2016-09-07 DIAGNOSIS — E782 Mixed hyperlipidemia: Secondary | ICD-10-CM | POA: Diagnosis not present

## 2016-09-07 DIAGNOSIS — Z1389 Encounter for screening for other disorder: Secondary | ICD-10-CM | POA: Diagnosis not present

## 2016-09-07 DIAGNOSIS — E1165 Type 2 diabetes mellitus with hyperglycemia: Secondary | ICD-10-CM | POA: Diagnosis not present

## 2016-09-07 DIAGNOSIS — Z13820 Encounter for screening for osteoporosis: Secondary | ICD-10-CM | POA: Diagnosis not present

## 2016-09-07 DIAGNOSIS — E039 Hypothyroidism, unspecified: Secondary | ICD-10-CM | POA: Diagnosis not present

## 2016-09-07 DIAGNOSIS — N182 Chronic kidney disease, stage 2 (mild): Secondary | ICD-10-CM | POA: Diagnosis not present

## 2016-09-07 DIAGNOSIS — Z9181 History of falling: Secondary | ICD-10-CM | POA: Diagnosis not present

## 2016-10-24 DIAGNOSIS — R062 Wheezing: Secondary | ICD-10-CM | POA: Diagnosis not present

## 2016-10-24 DIAGNOSIS — J45909 Unspecified asthma, uncomplicated: Secondary | ICD-10-CM | POA: Diagnosis not present

## 2016-10-24 DIAGNOSIS — N3000 Acute cystitis without hematuria: Secondary | ICD-10-CM | POA: Diagnosis not present

## 2016-10-24 DIAGNOSIS — N3091 Cystitis, unspecified with hematuria: Secondary | ICD-10-CM | POA: Diagnosis not present

## 2016-12-12 NOTE — Progress Notes (Deleted)
Office Visit Note  Patient: Brenda Hanna             Date of Birth: 03/23/1941           MRN: [id number]             PCP: No primary care provider on file. Referring: No ref. provider found Visit Date: 12/15/2016 Occupation: @GUAROCC @    Subjective:  No chief complaint on file.   History of Present Illness: SULLIVAN BLASING is a 76 y.o. female ***   Activities of Daily Living:  Patient reports morning stiffness for *** {minute/hour:19697}.   Patient {ACTIONS;DENIES/REPORTS:21021675::"Denies"} nocturnal pain.  Difficulty dressing/grooming: {ACTIONS;DENIES/REPORTS:21021675::"Denies"} Difficulty climbing stairs: {ACTIONS;DENIES/REPORTS:21021675::"Denies"} Difficulty getting out of chair: {ACTIONS;DENIES/REPORTS:21021675::"Denies"} Difficulty using hands for taps, buttons, cutlery, and/or writing: {ACTIONS;DENIES/REPORTS:21021675::"Denies"}   No Rheumatology ROS completed.   PMFS History:  Patient Active Problem List   Diagnosis Date Noted  . Rheumatoid arthritis involving multiple sites with positive rheumatoid factor ([place]) 07/14/2016  . High risk medication use 07/14/2016  . Sjogren's syndrome with keratoconjunctivitis sicca ([place]) 07/14/2016  . Raynaud's disease without gangrene 07/14/2016  . Achilles tendinitis of both lower extremities 07/14/2016  . Fibromyalgia 07/14/2016  . Anxiety and depression 07/14/2016  . History of asthma 07/14/2016  . Environmental allergies 07/14/2016  . Gastroesophageal reflux disease without esophagitis 07/14/2016  . Neuropathy 07/14/2016  . Osteopenia of multiple sites 07/14/2016  . Pain in both hands 07/14/2016  . Pain in both feet 07/14/2016    No past medical history on file.  No family history on file. No past surgical history on file. Social History   Social History Narrative  . No narrative on file     Objective: Vital Signs: There were no vitals taken for this visit.   Physical Exam   Musculoskeletal Exam:  ***  CDAI Exam: No CDAI exam completed.    Investigation: No additional findings. CBC Latest Ref Rng & Units 07/14/2016  WBC 3.8 - 10.8 K/uL 6.3  Hemoglobin 11.7 - 15.5 g/dL 11.9  Hematocrit 35.0 - 45.0 % 36.9  Platelets 140 - 400 K/uL 316   CMP Latest Ref Rng & Units 07/14/2016  Glucose 65 - 99 mg/dL 93  BUN 7 - 25 mg/dL 13  Creatinine 0.60 - 0.93 mg/dL 1.04(H)  Sodium 135 - 146 mmol/L 140  Potassium 3.5 - 5.3 mmol/L 4.9  Chloride 98 - 110 mmol/L 102  CO2 20 - 31 mmol/L 26  Calcium 8.6 - 10.4 mg/dL 9.5  Total Protein 6.1 - 8.1 g/dL 7.0  Total Bilirubin 0.2 - 1.2 mg/dL 0.4  Alkaline Phos 33 - 130 U/L 89  AST 10 - 35 U/L 25  ALT 6 - 29 U/L 15    Imaging: No results found.  Speciality Comments: No specialty comments available.    Procedures:  No procedures performed Allergies: Penicillins   Assessment / Plan:     Visit Diagnoses: Rheumatoid arthritis involving multiple sites with positive rheumatoid factor ([facility])  High risk medication use  Sjogren's syndrome with keratoconjunctivitis sicca ([facility])  Raynaud's disease without gangrene  Achilles tendinitis of both lower extremities  Fibromyalgia  History of asthma  Neuropathy  Osteopenia of multiple sites  Pain in both hands  Pain in both feet  History of gastroesophageal reflux (GERD)  History of anxiety and depression    Orders: No orders of the defined types were placed in this encounter.  No orders of the defined types were placed in this encounter.   Face-to-face time  spent with patient was *** minutes. 50% of time was spent in counseling and coordination of care.  Follow-Up Instructions: No Follow-up on file.   Rawlin Reaume, RT  Note - This record has been created using [employer].  Chart creation errors have been sought, but may not always  have been located. Such creation errors do not reflect on  the standard of medical care.

## 2016-12-15 ENCOUNTER — Ambulatory Visit: Payer: PPO | Admitting: Rheumatology

## 2017-01-12 DIAGNOSIS — N3001 Acute cystitis with hematuria: Secondary | ICD-10-CM | POA: Diagnosis not present

## 2017-02-20 NOTE — Progress Notes (Signed)
Office Visit Note  Patient: Brenda Hanna             Date of Birth: 16-Mar-1941           MRN: [id number]             PCP: Myrlene Broker, MD Referring: No ref. provider found Visit Date: 02/22/2017 Occupation: @GUAROCC @    Subjective:  Pain in multiple joints.   History of Present Illness: Brenda Hanna is a 76 y.o. female with history of rheumatoid arthritis, Sjogren's and fibromyalgia syndrome. She states she's been having increased pain and discomfort. She describes pain in her left wrist joint, bilateral knee joints bilateral ankles. She also continues to have pain and discomfort in her lower back and her tailbone. She has difficulty sitting for prolonged time. She notices joint swelling in her hands and ankles. She continues to have sicca symptoms for which she's been using over-the-counter medications.  Activities of Daily Living:  Patient reports morning stiffness for 1 hour.   Patient Denies nocturnal pain.  Difficulty dressing/grooming: Denies Difficulty climbing stairs: Reports Difficulty getting out of chair: Reports Difficulty using hands for taps, buttons, cutlery, and/or writing: Reports   Review of Systems  Constitutional: Positive for weight gain. Negative for fatigue, night sweats, weight loss and weakness.  HENT: Positive for mouth dryness. Negative for mouth sores, trouble swallowing, trouble swallowing and nose dryness.   Eyes: Positive for redness and dryness. Negative for pain and visual disturbance.  Respiratory: Negative for cough, shortness of breath and difficulty breathing.        With activity states due to Asthma, has had this for years  Cardiovascular: Positive for swelling in legs/feet. Negative for chest pain, palpitations, hypertension and irregular heartbeat.  Gastrointestinal: Negative.  Negative for blood in stool, constipation and diarrhea.  Endocrine: Negative.  Negative for increased urination.  Genitourinary: Negative for difficulty  urinating and vaginal dryness.       Has had three urinary tract infections recently has seen primary care  Musculoskeletal: Positive for arthralgias, joint pain, joint swelling and morning stiffness. Negative for myalgias, muscle weakness, muscle tenderness and myalgias.  Skin: Positive for hair loss. Negative for color change, rash, skin tightness, ulcers and sensitivity to sunlight.  Allergic/Immunologic: Negative for susceptible to infections.  Neurological: Negative.  Negative for dizziness, memory loss and night sweats.  Hematological: Negative.  Negative for swollen glands.  Psychiatric/Behavioral: Negative.  Negative for depressed mood and sleep disturbance. The patient is not nervous/anxious.     PMFS History:  Patient Active Problem List   Diagnosis Date Noted  . Rheumatoid arthritis involving multiple sites with positive rheumatoid factor ([place]) 07/14/2016  . High risk medication use 07/14/2016  . Sjogren's syndrome with keratoconjunctivitis sicca ([place]) 07/14/2016  . Raynaud's disease without gangrene 07/14/2016  . Achilles tendinitis of both lower extremities 07/14/2016  . Fibromyalgia 07/14/2016  . Anxiety and depression 07/14/2016  . History of asthma 07/14/2016  . Environmental allergies 07/14/2016  . Gastroesophageal reflux disease without esophagitis 07/14/2016  . Neuropathy 07/14/2016  . Osteopenia of multiple sites 07/14/2016  . Pain in both hands 07/14/2016  . Pain in both feet 07/14/2016    No past medical history on file.  No family history on file. No past surgical history on file. Social History   Social History Narrative  . No narrative on file     Objective: Vital Signs: BP 136/70   Pulse 70   Resp 14   Ht 5'  2" (1.575 m)   Wt 134 lb (60.8 kg)   BMI 24.51 kg/m    Physical Exam  Constitutional: She is oriented to person, place, and time. She appears well-developed and well-nourished.  HENT:  Head: Normocephalic and atraumatic.  Eyes:  Conjunctivae and EOM are normal.  Neck: Normal range of motion.  Cardiovascular: Normal rate, regular rhythm, normal heart sounds and intact distal pulses.   Pulmonary/Chest: Effort normal and breath sounds normal.  Abdominal: Soft. Bowel sounds are normal.  Lymphadenopathy:    She has no cervical adenopathy.  Neurological: She is alert and oriented to person, place, and time.  Skin: Skin is warm and dry. Capillary refill takes less than 2 seconds.  Psychiatric: She has a normal mood and affect. Her behavior is normal.  Nursing note and vitals reviewed.    Musculoskeletal Exam: C-spine and thoracic lumbar spine good range of motion. Shoulder joints elbow joints wrist joints MCPs PIPs DIPs with good range of motion. No synovitis was noted. She has tenderness across her PIPs. Some PIP/DIP thickening was noted consistent with osteoarthritis. Hip joints knee joints ankles MTPs PIPs with good range of motion. She has some thickening of Achilles tendon promptly due to cyst.  CDAI Exam: CDAI Homunculus Exam:   Joint Counts:  CDAI Tender Joint count: 0 CDAI Swollen Joint count: 0  Global Assessments:  Patient Global Assessment: 5 Provider Global Assessment: 1  CDAI Calculated Score: 6    Investigation: No additional findings.PLQ eye exam-08/2016 CBC Latest Ref Rng & Units 07/14/2016  WBC 3.8 - 10.8 K/uL 6.3  Hemoglobin 11.7 - 15.5 g/dL 11.9  Hematocrit 35.0 - 45.0 % 36.9  Platelets 140 - 400 K/uL 316   CMP     Component Value Date/Time   NA 140 07/14/2016 1523   K 4.9 07/14/2016 1523   CL 102 07/14/2016 1523   CO2 26 07/14/2016 1523   GLUCOSE 93 07/14/2016 1523   BUN 13 07/14/2016 1523   CREATININE 1.04 (H) 07/14/2016 1523   CALCIUM 9.5 07/14/2016 1523   PROT 7.0 07/14/2016 1523   ALBUMIN 4.3 07/14/2016 1523   AST 25 07/14/2016 1523   ALT 15 07/14/2016 1523   ALKPHOS 89 07/14/2016 1523   BILITOT 0.4 07/14/2016 1523   GFRNONAA 53 (L) 07/14/2016 1523   GFRAA 62 07/14/2016  1523    Imaging: No results found.  Speciality Comments: No specialty comments available.    Procedures:  No procedures performed Allergies: Penicillins and Shellfish allergy   Assessment / Plan:     Visit Diagnoses: Rheumatoid arthritis involving multiple sites with positive rheumatoid factor (HCC) - Positive RF, negative CCP. Patient complains of increased joint pain and joint swelling I do not see any synovitis on examination today. In my opinion her arthritis is very well controlled on Plaquenil.  High risk medication use - Plaquenil 200 mg a.m., 100 mg p.m.. Her labs are stable we will check her labs today and then every 5 months to monitor for drug toxicity. Her eye exam is up-to-date. A prescription refill for Plaquenil was given.   Sjogren's syndrome with keratoconjunctivitis sicca (HCC) - Positive ANA, positive Ro. She's been using Restasis and some over-the-counter products which is been helpful.  Raynaud's disease without gangrene: Not very active currently.  Osteopenia of multiple sites - December 2012 T score -2.1. Patient states her bone densities are done by her PCP. Have reminded her to have a repeat bone density.   Achilles tendinitis of both lower extremities she  does not have any active inflammation although she appears to have a cyst pain in her Achilles tendon as it is thickened. I offered referral to foot specialist but she declined.  Osteoarthritis hands: She does have some ongoing discomfort in her hands due to underlying osteoarthritis.  Fibromyalgia: She continues to have some generalized pain and discomfort.  Her other medical problems are listed as follows:  Anxiety and depression  History of asthma  Environmental allergies  Gastroesophageal reflux disease without esophagitis  Neuropathy    Orders: Orders Placed This Encounter  Procedures  . CBC with Differential/Platelet  . COMPLETE METABOLIC PANEL WITH GFR  . CBC with  Differential/Platelet  . COMPLETE METABOLIC PANEL WITH GFR   Meds ordered this encounter  Medications  . hydroxychloroquine (PLAQUENIL) 200 MG tablet    Sig: TAKE ONE TABLET BY MOUTH IN THE MORNING AND ONE-HALF TABLET AT BEDTIME.    Dispense:  135 tablet    Refill:  0    Face-to-face time spent with patient was 30 minutes. Greater than 50% of time was spent in counseling and coordination of care.  Follow-Up Instructions: Return in about 5 months (around 07/25/2017) for Rheumatoid arthritis, Sjogren's, Osteoarthritis.   Bo Merino, MD  Note - This record has been created using Editor, commissioning.  Chart creation errors have been sought, but may not always  have been located. Such creation errors do not reflect on  the standard of medical care.

## 2017-02-22 ENCOUNTER — Ambulatory Visit (INDEPENDENT_AMBULATORY_CARE_PROVIDER_SITE_OTHER): Payer: PPO | Admitting: Rheumatology

## 2017-02-22 ENCOUNTER — Encounter: Payer: Self-pay | Admitting: Rheumatology

## 2017-02-22 VITALS — BP 136/70 | HR 70 | Resp 14 | Ht 62.0 in | Wt 134.0 lb

## 2017-02-22 DIAGNOSIS — Z8709 Personal history of other diseases of the respiratory system: Secondary | ICD-10-CM | POA: Diagnosis not present

## 2017-02-22 DIAGNOSIS — K219 Gastro-esophageal reflux disease without esophagitis: Secondary | ICD-10-CM

## 2017-02-22 DIAGNOSIS — M7662 Achilles tendinitis, left leg: Secondary | ICD-10-CM | POA: Diagnosis not present

## 2017-02-22 DIAGNOSIS — F329 Major depressive disorder, single episode, unspecified: Secondary | ICD-10-CM

## 2017-02-22 DIAGNOSIS — I7300 Raynaud's syndrome without gangrene: Secondary | ICD-10-CM

## 2017-02-22 DIAGNOSIS — Z79899 Other long term (current) drug therapy: Secondary | ICD-10-CM | POA: Diagnosis not present

## 2017-02-22 DIAGNOSIS — M8589 Other specified disorders of bone density and structure, multiple sites: Secondary | ICD-10-CM | POA: Diagnosis not present

## 2017-02-22 DIAGNOSIS — M3501 Sicca syndrome with keratoconjunctivitis: Secondary | ICD-10-CM

## 2017-02-22 DIAGNOSIS — M0579 Rheumatoid arthritis with rheumatoid factor of multiple sites without organ or systems involvement: Secondary | ICD-10-CM

## 2017-02-22 DIAGNOSIS — M7661 Achilles tendinitis, right leg: Secondary | ICD-10-CM

## 2017-02-22 DIAGNOSIS — G629 Polyneuropathy, unspecified: Secondary | ICD-10-CM

## 2017-02-22 DIAGNOSIS — F419 Anxiety disorder, unspecified: Secondary | ICD-10-CM

## 2017-02-22 DIAGNOSIS — M797 Fibromyalgia: Secondary | ICD-10-CM | POA: Diagnosis not present

## 2017-02-22 LAB — CBC WITH DIFFERENTIAL/PLATELET
BASOS ABS: 71 {cells}/uL (ref 0–200)
Basophils Relative: 1 %
EOS ABS: 497 {cells}/uL (ref 15–500)
Eosinophils Relative: 7 %
HEMATOCRIT: 35.5 % (ref 35.0–45.0)
Hemoglobin: 11.7 g/dL (ref 11.7–15.5)
LYMPHS PCT: 29 %
Lymphs Abs: 2059 cells/uL (ref 850–3900)
MCH: 32.4 pg (ref 27.0–33.0)
MCHC: 33 g/dL (ref 32.0–36.0)
MCV: 98.3 fL (ref 80.0–100.0)
MONOS PCT: 10 %
MPV: 9.9 fL (ref 7.5–12.5)
Monocytes Absolute: 710 cells/uL (ref 200–950)
Neutro Abs: 3763 cells/uL (ref 1500–7800)
Neutrophils Relative %: 53 %
PLATELETS: 322 10*3/uL (ref 140–400)
RBC: 3.61 MIL/uL — ABNORMAL LOW (ref 3.80–5.10)
RDW: 13.4 % (ref 11.0–15.0)
WBC: 7.1 10*3/uL (ref 3.8–10.8)

## 2017-02-22 MED ORDER — HYDROXYCHLOROQUINE SULFATE 200 MG PO TABS: ORAL_TABLET | ORAL | 0 refills | Status: DC

## 2017-02-22 NOTE — Patient Instructions (Signed)
Standing Labs We placed an order today for your standing lab work.    Please come back and get your standing labs in January  We have open lab Monday through Friday from 8:30-11:30 AM and 1:30-4 PM at the office of Dr. Bo Merino.   The office is located at [address], [place], [place], [place] [address] No appointment is necessary.   Labs are drawn by [facility].  You may receive a bill from [facility] for your lab work. If you have any questions regarding directions or hours of operation,  please call [phone].

## 2017-02-23 LAB — COMPLETE METABOLIC PANEL WITH GFR
ALBUMIN: 4.1 g/dL (ref 3.6–5.1)
ALT: 13 U/L (ref 6–29)
AST: 25 U/L (ref 10–35)
Alkaline Phosphatase: 103 U/L (ref 33–130)
BILIRUBIN TOTAL: 0.3 mg/dL (ref 0.2–1.2)
BUN: 11 mg/dL (ref 7–25)
CO2: 27 mmol/L (ref 20–32)
CREATININE: 0.95 mg/dL — AB (ref 0.60–0.93)
Calcium: 9.6 mg/dL (ref 8.6–10.4)
Chloride: 98 mmol/L (ref 98–110)
GFR, Est African American: 69 mL/min (ref >=60)
GFR, Est Non African American: 60 mL/min (ref >=60)
GLUCOSE: 93 mg/dL (ref 65–99)
Potassium: 5.5 mmol/L — ABNORMAL HIGH (ref 3.5–5.3)
SODIUM: 135 mmol/L (ref 135–146)
TOTAL PROTEIN: 6.7 g/dL (ref 6.1–8.1)

## 2017-02-24 ENCOUNTER — Telehealth: Payer: Self-pay | Admitting: Rheumatology

## 2017-02-24 NOTE — Telephone Encounter (Signed)
Patient has an appt Monday with GP Dr. Janace Litten  [place]  [phone]. Last lab results sent over. Per patient gp has not received them.

## 2017-02-27 DIAGNOSIS — E875 Hyperkalemia: Secondary | ICD-10-CM | POA: Diagnosis not present

## 2017-02-27 DIAGNOSIS — J309 Allergic rhinitis, unspecified: Secondary | ICD-10-CM | POA: Diagnosis not present

## 2017-02-27 DIAGNOSIS — F419 Anxiety disorder, unspecified: Secondary | ICD-10-CM | POA: Diagnosis not present

## 2017-02-27 DIAGNOSIS — F5104 Psychophysiologic insomnia: Secondary | ICD-10-CM | POA: Diagnosis not present

## 2017-02-27 NOTE — Telephone Encounter (Signed)
Labs re faxed to PCP.

## 2017-04-06 DIAGNOSIS — Z23 Encounter for immunization: Secondary | ICD-10-CM | POA: Diagnosis not present

## 2017-05-04 DIAGNOSIS — L719 Rosacea, unspecified: Secondary | ICD-10-CM | POA: Diagnosis not present

## 2017-05-04 DIAGNOSIS — L570 Actinic keratosis: Secondary | ICD-10-CM | POA: Diagnosis not present

## 2017-05-04 DIAGNOSIS — C44319 Basal cell carcinoma of skin of other parts of face: Secondary | ICD-10-CM | POA: Diagnosis not present

## 2017-05-31 ENCOUNTER — Other Ambulatory Visit: Payer: Self-pay | Admitting: Rheumatology

## 2017-05-31 DIAGNOSIS — Z79899 Other long term (current) drug therapy: Secondary | ICD-10-CM

## 2017-05-31 NOTE — Telephone Encounter (Signed)
Last Visit: 02/22/17 Next Visit due January 2019. Message sent to the front to schedule patient  Labs: 02/22/17 Elevated Potassium. PCP aware.  PLQ Eye Exam: 08/10/16 WNL   Okay to refill per Dr. Estanislado Pandy

## 2017-06-01 ENCOUNTER — Other Ambulatory Visit: Payer: Self-pay | Admitting: Rheumatology

## 2017-06-01 DIAGNOSIS — Z79899 Other long term (current) drug therapy: Secondary | ICD-10-CM

## 2017-06-04 ENCOUNTER — Other Ambulatory Visit: Payer: Self-pay | Admitting: Rheumatology

## 2017-06-04 DIAGNOSIS — Z79899 Other long term (current) drug therapy: Secondary | ICD-10-CM

## 2017-06-23 NOTE — Progress Notes (Signed)
Office Visit Note  Patient: Brenda Hanna             Date of Birth: [birth year]           MRN: [id number]             PCP: Myrlene Broker, MD Referring: Myrlene Broker, MD Visit Date: 07/07/2017 Occupation: @GUAROCC @    Subjective:  Pain in multiple joints   History of Present Illness: Brenda Hanna is a 76 y.o. female with history of seropositive rheumatoid arthritis, osteoarthritis, fibromyalgia, Raynaud's, Sjogren's syndrome.  Patient states she is having increased generalized pain and pain in multiple joints.  She states she is having bilateral hand pain and swelling. She continues to take Plaquenil 200 mg 1 tablet in the morning and 1/2 tablet in the evening daily.  She states she tried [place] for one month but she did not have any refills.  She states she tolerated the medication well and would like to get back on it. She also has bilateral knee pain, worse in her right knee.  She states her knees are worse in the evening and swell and become red.  She states she is having increased numbness in her feet, which has led her to fall multiple times.  She has bruising on her legs.  She continues to have achilles tendonitis bilaterally and she has nodules at the base of her achilles.  She tries to perform stretches daily.  Her Raynaud's has been worsening with the cold weather.  She denies digital ulcers.  She continues to have sicca symptoms.  She uses Restasis daily.  She states her fibromyalgia has been flaring.  She takes Aleve up to 4-5 times per day.  She is also taking Tramadol PRN.  She states her lower back pain and tailbone pain are the most severe.  She has recently seen a chiropractor, which helped temporarily.  She would like x-rays of her back.  Her back pain has been keeping her up at night.     Activities of Daily Living:  Patient reports morning stiffness for 2 hours.   Patient Reports nocturnal pain.  Difficulty dressing/grooming: Denies Difficulty climbing stairs:  Reports Difficulty getting out of chair: Reports Difficulty using hands for taps, buttons, cutlery, and/or writing: Denies   Review of Systems  Constitutional: Positive for fatigue. Negative for weakness.  HENT: Positive for mouth dryness. Negative for mouth sores and nose dryness.   Eyes: Positive for dryness (On restasis ). Negative for redness.  Respiratory: Negative for cough, hemoptysis, shortness of breath and difficulty breathing.   Cardiovascular: Negative for chest pain, palpitations, hypertension, irregular heartbeat and swelling in legs/feet.  Gastrointestinal: Negative for blood in stool, constipation and diarrhea.  Endocrine: Negative for increased urination.  Genitourinary: Negative for painful urination.  Musculoskeletal: Positive for arthralgias, joint pain, joint swelling, muscle weakness, morning stiffness and muscle tenderness. Negative for myalgias and myalgias.  Skin: Positive for color change and nodules/bumps (On achilles tendon). Negative for pallor, rash, hair loss, redness, skin tightness, ulcers and sensitivity to sunlight.  Neurological: Negative for dizziness, numbness and headaches.  Hematological: Negative for swollen glands.  Psychiatric/Behavioral: Positive for sleep disturbance. Negative for depressed mood. The patient is nervous/anxious.     PMFS History:  Patient Active Problem List   Diagnosis Date Noted  . Rheumatoid arthritis involving multiple sites with positive rheumatoid factor ([place]) 07/14/2016  . High risk medication use 07/14/2016  . Sjogren's syndrome with keratoconjunctivitis sicca ([place]) 07/14/2016  .  Raynaud's disease without gangrene 07/14/2016  . Achilles tendinitis of both lower extremities 07/14/2016  . Fibromyalgia 07/14/2016  . Anxiety and depression 07/14/2016  . History of asthma 07/14/2016  . Environmental allergies 07/14/2016  . Gastroesophageal reflux disease without esophagitis 07/14/2016  . Neuropathy 07/14/2016  .  Osteopenia of multiple sites 07/14/2016  . Pain in both hands 07/14/2016  . Pain in both feet 07/14/2016    History reviewed. No pertinent past medical history.  Family History  Problem Relation Age of Onset  . Heart failure Mother   . Heart failure Father   . Healthy Daughter   . Healthy Daughter   . Healthy Son    Past Surgical History:  Procedure Laterality Date  . ABDOMINAL HYSTERECTOMY    . BLADDER SURGERY     Social History   Social History Narrative  . Not on file     Objective: Vital Signs: BP (!) 157/72 (BP Location: Left Arm, Patient Position: Sitting, Cuff Size: Normal)   Pulse 69   Resp 15   Ht 5' 1.5" (1.562 m)   Wt 131 lb (59.4 kg)   BMI 24.35 kg/m    Physical Exam  Constitutional: She is oriented to person, place, and time. She appears well-developed and well-nourished.  HENT:  Head: Normocephalic and atraumatic.  Eyes: Conjunctivae and EOM are normal.  Neck: Normal range of motion.  Cardiovascular: Normal rate, regular rhythm, normal heart sounds and intact distal pulses.  Pulmonary/Chest: Effort normal and breath sounds normal.  Abdominal: Soft. Bowel sounds are normal.  Lymphadenopathy:    She has no cervical adenopathy.  Neurological: She is alert and oriented to person, place, and time.  Skin: Skin is warm and dry. Capillary refill takes less than 2 seconds.  Psychiatric: She has a normal mood and affect. Her behavior is normal.  Nursing note and vitals reviewed.    Musculoskeletal Exam: C-spine limited ROM.  She has midline tenderness in the lower lumbar region.  Limited ROM of thoracic and lumbar.  Shoulder joints, elbow joints, wrist joints, MCPs, PIPs, and DIPs good ROM with no synovitis.  Hip joints good ROM.  Full ROM of knees with discomfort.  No effusion or warmth.  Ankle joints, MTPs, PIPs, and DIPs good ROM with no synovitis.  SI joint tenderness.  Achilles tendonitis with nodules at the base of bilateral achilles.    CDAI Exam: CDAI  Homunculus Exam:   Joint Counts:  CDAI Tender Joint count: 0 CDAI Swollen Joint count: 0  Global Assessments:  Patient Global Assessment: 8   CDAI Calculated Score: 8    Investigation: No additional findings.PLQ eye exam: 08/10/2016 CBC Latest Ref Rng & Units 02/22/2017 07/14/2016  WBC 3.8 - 10.8 K/uL 7.1 6.3  Hemoglobin 11.7 - 15.5 g/dL 11.7 11.9  Hematocrit 35.0 - 45.0 % 35.5 36.9  Platelets 140 - 400 K/uL 322 316   CMP Latest Ref Rng & Units 02/22/2017 07/14/2016  Glucose 65 - 99 mg/dL 93 93  BUN 7 - 25 mg/dL 11 13  Creatinine 0.60 - 0.93 mg/dL 0.95(H) 1.04(H)  Sodium 135 - 146 mmol/L 135 140  Potassium 3.5 - 5.3 mmol/L 5.5(H) 4.9  Chloride 98 - 110 mmol/L 98 102  CO2 20 - 32 mmol/L 27 26  Calcium 8.6 - 10.4 mg/dL 9.6 9.5  Total Protein 6.1 - 8.1 g/dL 6.7 7.0  Total Bilirubin 0.2 - 1.2 mg/dL 0.3 0.4  Alkaline Phos 33 - 130 U/L 103 89  AST 10 - 35 U/L  25 25  ALT 6 - 29 U/L 13 15    Imaging: Xr Sacrum/coccyx  Result Date: 07/07/2017 No fractures noted.  Unremarkable x-ray of the coccyx.  Partial lumbar spine was visualized, which shows severe L3-L4 and L4-L5 disc space narrowing and facet joint arthropathy.    Xr Foot 2 Views Left  Result Date: 07/07/2017 PIP and DIP joint space narrowing.  No MTP involvement or erosion.  Dorsal spurring present. Impression: findings are consistent with mild osteoarthritis.      Xr Foot 2 Views Right  Result Date: 07/07/2017 PIP and DIP joint space narrowing.  No MTP involvement or erosion.  Small calcaneal spurr present. Impression: Findings are consistent with mild osteoarthritis.      Xr Knee 3 View Left  Result Date: 07/07/2017 Mild to moderate osteoarthritis.  Intercondylar osteophytes Medial compartment joint space narrowing. Mild patellofemoral joint space narrowing Impression: moderate osteoarthritis and mild chondromalacia patella.   Xr Knee 3 View Right  Result Date: 07/07/2017 Mild to moderate osteoarthritis.  Intercondylar  osteophytes Medial compartment joint space narrowing. Mild patellofemoral joint space narrowing Impression: moderate osteoarthritis and mild chondromalacia patella.   Xr Pelvis 1-2 Views  Result Date: 07/07/2017 Normal x-ray of bilateral hips.  No sclerosis of SI joints. Impression: Normal x-ray of the pelvis.     Speciality Comments: No specialty comments available.  Procedures:  No procedures performed Allergies: Contrast media [iodinated diagnostic agents]; Penicillins; and Shellfish allergy   Assessment / Plan:     Visit Diagnoses: Rheumatoid arthritis involving multiple sites with positive rheumatoid factor (HCC) - Positive RF, negative CCP:  No synovitis on exam.  She will continue on Plaquenil 200 mg , 1 tablet in the morning and a 1/2 tablet in the evening.  We discussed starting Arava 10 mg, which she previously tried but the [place] caused a rash and nausea so we are not going to retry this. Based on her examination I do not think she needs more aggressive therapy at this time.  High risk medication use -Plaquenil 200 mg a.m., 100 mg p.m. PLQ eye exam: 08/10/2016.  CBC and CMP are drawn today.  A plaquenil eye exam form was given to the patient.  A refill was sent for PLQ.   - Plan: CBC with Differential/Platelet, COMPLETE METABOLIC PANEL WITH GFR, hydroxychloroquine (PLAQUENIL) 200 MG tablet  Chronic pain of both knees - She has pain in bilateral knees, worse in the right knee.  she has no effusion or warmth on exam.  She has discomfort with ROM. X-rays were obtained today, which revealed findings consistent with osteoarthritis of bilateral knees. Plan: XR KNEE 3 VIEW RIGHT, XR KNEE 3 VIEW LEFT. The x-ray findings of her knee joints were discussed with patient. They were consistent with mild to moderate osteoarthritis.  Pain in both feet - She is having pain in bilateral feet as well as numbness and tingling. X-rays of her bilateral feet were obtained today, which revealed findings  consistent with osteoarthritis. Plan: XR Foot 2 Views Right, XR Foot 2 Views Left. The feet x-rays reveal osteoarthritic changes which were also discussed with patient.  DDD, lumbar: She is having significant discomfort in her lower lumbar region.  X-rays reveal L3-L4 and L4-L5 disc space narrowing and facet joint arthropathy.  She would like a referral to Dr. Louanne Skye.    Chronic SI joint pain - She has been having significant discomfort in her SI joints and coccyx.  The pain has been waking her up at night.  XR of her pelvis and coccyx were obtained today, which did not reveal SI joint sclerosis or a coccyx fracture. Plan: XR Pelvis 1-2 Views, XR Sacrum/Coccyx . The x-ray of the coccyx is was unremarkable. It showed some degenerative changes in the lumbar spine.  Sjogren's syndrome with keratoconjunctivitis sicca (HCC) - Positive ANA, positive Ro. She's been using Restasis and some over-the-counter products which is been helpful.    Raynaud's disease without gangrene: No digital ulcerations present.  Encourageder to keep her core body temperature warm and to wear gloves as much as possible.    Primary osteoarthritis of both hands: She has PIP and DIP synovial thickening consistent with osteoarthritis.  She continues to have pain in her hands.  No synovitis on exam.    Achilles tendinitis of both lower extremities: She continues to have achilles tendonitis and nodules at the base of her achilles tendon.  Encouraged her to continue to perform exercises at home.  Fibromyalgia: She has been have more frequent fibromyalgia flares.  She has generalized hyperalgesia on exam and tender points.    Osteopenia of multiple sites - December 2012 T score -2.1. Patient states her bone densities are done by her PCP. I advised her to call her PCP to get this scheduled.    History of neuropathy: Patient states she is having increased numbness in bilateral feet.  She declined a neurology referral at this time.  She is  going to follow up with her PCP regarding this.    History of anxiety: She takes Ativan 1 mg PRN.   History of depression  Other medical conditions are listed as follows:   History of gastroesophageal reflux (GERD)  History of asthma  Environmental allergies   Orders: Orders Placed This Encounter  Procedures  . XR Pelvis 1-2 Views  . XR Foot 2 Views Right  . XR Foot 2 Views Left  . XR KNEE 3 VIEW RIGHT  . XR KNEE 3 VIEW LEFT  . XR Sacrum/Coccyx  . CBC with Differential/Platelet  . COMPLETE METABOLIC PANEL WITH GFR  . CBC with Differential/Platelet  . COMPLETE METABOLIC PANEL WITH GFR  . Ambulatory referral to Orthopedic Surgery    Face-to-face time spent with patient was 90 minutes. Greater than 50% of time was spent in counseling and coordination of care.  Follow-Up Instructions: Return in about 5 months (around 12/05/2017) for Rheumatoid arthritis.  Bo Merino, MD Note - This record has been created using Editor, commissioning.  Chart creation errors have been sought, but may not always  have been located. Such creation errors do not reflect on  the standard of medical care.

## 2017-07-07 ENCOUNTER — Encounter: Payer: Self-pay | Admitting: Rheumatology

## 2017-07-07 ENCOUNTER — Telehealth (INDEPENDENT_AMBULATORY_CARE_PROVIDER_SITE_OTHER): Payer: Self-pay | Admitting: Specialist

## 2017-07-07 ENCOUNTER — Ambulatory Visit (INDEPENDENT_AMBULATORY_CARE_PROVIDER_SITE_OTHER): Payer: Self-pay

## 2017-07-07 ENCOUNTER — Ambulatory Visit (INDEPENDENT_AMBULATORY_CARE_PROVIDER_SITE_OTHER): Payer: PPO | Admitting: Rheumatology

## 2017-07-07 VITALS — BP 157/72 | HR 69 | Resp 15 | Ht 61.5 in | Wt 131.0 lb

## 2017-07-07 DIAGNOSIS — M25561 Pain in right knee: Secondary | ICD-10-CM | POA: Diagnosis not present

## 2017-07-07 DIAGNOSIS — Z8669 Personal history of other diseases of the nervous system and sense organs: Secondary | ICD-10-CM

## 2017-07-07 DIAGNOSIS — Z79899 Other long term (current) drug therapy: Secondary | ICD-10-CM | POA: Diagnosis not present

## 2017-07-07 DIAGNOSIS — M19041 Primary osteoarthritis, right hand: Secondary | ICD-10-CM

## 2017-07-07 DIAGNOSIS — I7300 Raynaud's syndrome without gangrene: Secondary | ICD-10-CM

## 2017-07-07 DIAGNOSIS — M25562 Pain in left knee: Secondary | ICD-10-CM | POA: Diagnosis not present

## 2017-07-07 DIAGNOSIS — M797 Fibromyalgia: Secondary | ICD-10-CM

## 2017-07-07 DIAGNOSIS — M3501 Sicca syndrome with keratoconjunctivitis: Secondary | ICD-10-CM | POA: Diagnosis not present

## 2017-07-07 DIAGNOSIS — M79672 Pain in left foot: Secondary | ICD-10-CM

## 2017-07-07 DIAGNOSIS — M7662 Achilles tendinitis, left leg: Secondary | ICD-10-CM

## 2017-07-07 DIAGNOSIS — G8929 Other chronic pain: Secondary | ICD-10-CM

## 2017-07-07 DIAGNOSIS — M533 Sacrococcygeal disorders, not elsewhere classified: Secondary | ICD-10-CM | POA: Diagnosis not present

## 2017-07-07 DIAGNOSIS — M0579 Rheumatoid arthritis with rheumatoid factor of multiple sites without organ or systems involvement: Secondary | ICD-10-CM | POA: Diagnosis not present

## 2017-07-07 DIAGNOSIS — M8589 Other specified disorders of bone density and structure, multiple sites: Secondary | ICD-10-CM

## 2017-07-07 DIAGNOSIS — M79671 Pain in right foot: Secondary | ICD-10-CM | POA: Diagnosis not present

## 2017-07-07 DIAGNOSIS — Z8719 Personal history of other diseases of the digestive system: Secondary | ICD-10-CM

## 2017-07-07 DIAGNOSIS — Z8709 Personal history of other diseases of the respiratory system: Secondary | ICD-10-CM | POA: Diagnosis not present

## 2017-07-07 DIAGNOSIS — M5136 Other intervertebral disc degeneration, lumbar region: Secondary | ICD-10-CM

## 2017-07-07 DIAGNOSIS — M19042 Primary osteoarthritis, left hand: Secondary | ICD-10-CM

## 2017-07-07 DIAGNOSIS — M51369 Other intervertebral disc degeneration, lumbar region without mention of lumbar back pain or lower extremity pain: Secondary | ICD-10-CM

## 2017-07-07 DIAGNOSIS — Z8659 Personal history of other mental and behavioral disorders: Secondary | ICD-10-CM

## 2017-07-07 DIAGNOSIS — Z9109 Other allergy status, other than to drugs and biological substances: Secondary | ICD-10-CM

## 2017-07-07 DIAGNOSIS — M7661 Achilles tendinitis, right leg: Secondary | ICD-10-CM | POA: Diagnosis not present

## 2017-07-07 LAB — CBC WITH DIFFERENTIAL/PLATELET
BASOS ABS: 73 {cells}/uL (ref 0–200)
Basophils Relative: 1 %
EOS PCT: 10.9 %
Eosinophils Absolute: 796 cells/uL — ABNORMAL HIGH (ref 15–500)
HCT: 35.4 % (ref 35.0–45.0)
Hemoglobin: 12.1 g/dL (ref 11.7–15.5)
Lymphs Abs: 1402 cells/uL (ref 850–3900)
MCH: 32.6 pg (ref 27.0–33.0)
MCHC: 34.2 g/dL (ref 32.0–36.0)
MCV: 95.4 fL (ref 80.0–100.0)
MONOS PCT: 8.4 %
MPV: 10.7 fL (ref 7.5–12.5)
NEUTROS PCT: 60.5 %
Neutro Abs: 4417 cells/uL (ref 1500–7800)
PLATELETS: 299 10*3/uL (ref 140–400)
RBC: 3.71 10*6/uL — AB (ref 3.80–5.10)
RDW: 11.8 % (ref 11.0–15.0)
Total Lymphocyte: 19.2 %
WBC mixed population: 613 cells/uL (ref 200–950)
WBC: 7.3 10*3/uL (ref 3.8–10.8)

## 2017-07-07 LAB — COMPLETE METABOLIC PANEL WITH GFR
AG Ratio: 1.5 (calc) (ref 1.0–2.5)
ALT: 14 U/L (ref 6–29)
AST: 25 U/L (ref 10–35)
Albumin: 4.1 g/dL (ref 3.6–5.1)
Alkaline phosphatase (APISO): 102 U/L (ref 33–130)
BUN/Creatinine Ratio: 15 (calc) (ref 6–22)
BUN: 16 mg/dL (ref 7–25)
CO2: 30 mmol/L (ref 20–32)
CREATININE: 1.07 mg/dL — AB (ref 0.60–0.93)
Calcium: 9.5 mg/dL (ref 8.6–10.4)
Chloride: 101 mmol/L (ref 98–110)
GFR, Est African American: 59 mL/min/{1.73_m2} — ABNORMAL LOW (ref >=60)
GFR, Est Non African American: 51 mL/min/{1.73_m2} — ABNORMAL LOW (ref >=60)
GLOBULIN: 2.7 g/dL (ref 1.9–3.7)
GLUCOSE: 97 mg/dL (ref 65–99)
Potassium: 4.9 mmol/L (ref 3.5–5.3)
SODIUM: 138 mmol/L (ref 135–146)
Total Bilirubin: 0.4 mg/dL (ref 0.2–1.2)
Total Protein: 6.8 g/dL (ref 6.1–8.1)

## 2017-07-07 MED ORDER — LEFLUNOMIDE 10 MG PO TABS: 10.0000 mg | ORAL_TABLET | Freq: Every day | ORAL | 2 refills | Status: DC

## 2017-07-07 MED ORDER — HYDROXYCHLOROQUINE SULFATE 200 MG PO TABS: ORAL_TABLET | ORAL | 0 refills | Status: DC

## 2017-07-07 NOTE — Telephone Encounter (Signed)
Please put patient on call list if anything opens before 02/01. Says she's in a lot of pain, thanks! Her CB # [phone]

## 2017-07-07 NOTE — Telephone Encounter (Signed)
placed on the cancellation list

## 2017-07-07 NOTE — Patient Instructions (Addendum)
Standing Labs We placed an order today for your standing lab work.    Please come back and get your standing labs in 5 months  We have open lab Monday through Friday from 8:30-11:30 AM and 1:30-4 PM at the office of Dr. Bo Merino.   The office is located at [address] [place], [place], [place] [address] No appointment is necessary.   Labs are drawn by [facility].  You may receive a bill from [facility] for your lab work. If you have any questions regarding directions or hours of operation,  please call [phone].

## 2017-07-10 NOTE — Progress Notes (Signed)
Creatinine elevated and continues to trend up. GFR low.  Please advise patient to avoid use of NSAIDs and alcohol.

## 2017-07-25 ENCOUNTER — Ambulatory Visit (INDEPENDENT_AMBULATORY_CARE_PROVIDER_SITE_OTHER): Payer: PPO

## 2017-07-25 ENCOUNTER — Ambulatory Visit (INDEPENDENT_AMBULATORY_CARE_PROVIDER_SITE_OTHER): Payer: PPO | Admitting: Specialist

## 2017-07-25 ENCOUNTER — Encounter (INDEPENDENT_AMBULATORY_CARE_PROVIDER_SITE_OTHER): Payer: Self-pay | Admitting: Specialist

## 2017-07-25 VITALS — BP 153/68 | HR 63 | Ht 61.5 in | Wt 130.0 lb

## 2017-07-25 DIAGNOSIS — M48062 Spinal stenosis, lumbar region with neurogenic claudication: Secondary | ICD-10-CM | POA: Diagnosis not present

## 2017-07-25 DIAGNOSIS — R2689 Other abnormalities of gait and mobility: Secondary | ICD-10-CM

## 2017-07-25 DIAGNOSIS — M5442 Lumbago with sciatica, left side: Secondary | ICD-10-CM

## 2017-07-25 DIAGNOSIS — M5441 Lumbago with sciatica, right side: Secondary | ICD-10-CM | POA: Diagnosis not present

## 2017-07-25 DIAGNOSIS — M5136 Other intervertebral disc degeneration, lumbar region: Secondary | ICD-10-CM | POA: Diagnosis not present

## 2017-07-25 DIAGNOSIS — G8929 Other chronic pain: Secondary | ICD-10-CM | POA: Diagnosis not present

## 2017-07-25 DIAGNOSIS — G9519 Other vascular myelopathies: Secondary | ICD-10-CM

## 2017-07-25 MED ORDER — TRAMADOL HCL 50 MG PO TABS: 50.0000 mg | ORAL_TABLET | Freq: Four times a day (QID) | ORAL | 0 refills | Status: DC | PRN

## 2017-07-25 MED ORDER — AMITRIPTYLINE HCL 10 MG PO TABS: 10.0000 mg | ORAL_TABLET | Freq: Every day | ORAL | 1 refills | Status: DC

## 2017-07-25 NOTE — Progress Notes (Signed)
Office Visit Note   Patient: Brenda Hanna           Date of Birth: 12/07/1940           MRN: [id number] Visit Date: 07/25/2017              Requested by: Bo Merino, MD [address] [place], [place] [address] PCP: Myrlene Broker, MD   Assessment & Plan: Visit Diagnoses:  1. Chronic bilateral low back pain with bilateral sciatica     Plan: Avoid frequent bending and stooping  No lifting greater than 10 lbs. May use ice or moist heat for pain. Weight loss is of benefit. Handicap license is approved. 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 Well padded shoes help. Ice the knee 2-3 times a day 15-20 mins at a time. Fall Prevention and Home Safety Falls cause injuries and can affect all age groups. It is possible to use preventive measures to significantly decrease the likelihood of falls. There are many simple measures which can make your home safer and prevent falls. OUTDOORS  Repair cracks and edges of walkways and driveways.  Remove high doorway thresholds.  Trim shrubbery on the main path into your home.  Have good outside lighting.  Clear walkways of tools, rocks, debris, and clutter.  Check that handrails are not broken and are securely fastened. Both sides of steps should have handrails.  Have leaves, snow, and ice cleared regularly.  Use sand or salt on walkways during winter months.  In the garage, clean up grease or oil spills. BATHROOM  Install night lights.  Install grab bars by the toilet and in the tub and shower.  Use non-skid mats or decals in the tub or shower.  Place a plastic non-slip stool in the shower to sit on, if needed.  Keep floors dry and clean up all water on the floor immediately.  Remove soap buildup in the tub or shower on a regular basis.  Secure bath mats with non-slip, double-sided rug tape.  Remove throw rugs and tripping hazards from the  floors. BEDROOMS  Install night lights.  Make sure a bedside light is easy to reach.  Do not use oversized bedding.  Keep a telephone by your bedside.  Have a firm chair with side arms to use for getting dressed.  Remove throw rugs and tripping hazards from the floor. KITCHEN  Keep handles on pots and pans turned toward the center of the stove. Use back burners when possible.  Clean up spills quickly and allow time for drying.  Avoid walking on wet floors.  Avoid hot utensils and knives.  Position shelves so they are not too high or low.  Place commonly used objects within easy reach.  If necessary, use a sturdy step stool with a grab bar when reaching.  Keep electrical cables out of the way.  Do not use floor polish or wax that makes floors slippery. If you must use wax, use non-skid floor wax.  Remove throw rugs and tripping hazards from the floor. STAIRWAYS  Never leave objects on stairs.  Place handrails on both sides of stairways and use them. Fix any loose handrails. Make sure handrails on both sides of the stairways are as long as the stairs.  Check carpeting to make sure it is firmly attached along stairs. Make repairs to worn or loose carpet promptly.  Avoid placing throw rugs at the top or bottom of stairways, or  properly secure the rug with carpet tape to prevent slippage. Get rid of throw rugs, if possible.  Have an electrician put in a light switch at the top and bottom of the stairs. OTHER FALL PREVENTION TIPS  Wear low-heel or rubber-soled shoes that are supportive and fit well. Wear closed toe shoes.  When using a stepladder, make sure it is fully opened and both spreaders are firmly locked. Do not climb a closed stepladder.  Add color or contrast paint or tape to grab bars and handrails in your home. Place contrasting color strips on first and last steps.  Learn and use mobility aids as needed. Install an electrical emergency response  system.  Turn on lights to avoid dark areas. Replace light bulbs that burn out immediately. Get light switches that glow.  Arrange furniture to create clear pathways. Keep furniture in the same place.  Firmly attach carpet with non-skid or double-sided tape.  Eliminate uneven floor surfaces.  Select a carpet pattern that does not visually hide the edge of steps.  Be aware of all pets. OTHER HOME SAFETY TIPS  Set the water temperature for 120 F (48.8 C).  Keep emergency numbers on or near the telephone.  Keep smoke detectors on every level of the home and near sleeping areas. Document Released: 06/10/2002 Document Revised: 12/20/2011 Document Reviewed: 09/09/2011 [facility] Patient Information 2014 [place].  Follow-Up Instructions: No Follow-up on file.   Orders:  Orders Placed This Encounter  Procedures  . XR Lumbar Spine 2-3 Views   No orders of the defined types were placed in this encounter.     Procedures: No procedures performed   Clinical Data: No additional findings.   Subjective: Chief Complaint  Patient presents with  . Lower Back - Pain, Injury    History of falls    77 year old female with low back pain with radiation into the buttocks and posterior thigh pain. The pain is present aching pain in the tail bone and both buttocks and the small of the back.  There are sharp stabbing pains with feelings of being sick to her stomach. She finds her self having to lie down, it feels better lying down and when she moves sometimes it is painfuf. Has a history of RA and is on plaquenil.  Have tried areva with sideeffects. Bowel and bladder function normal, has had a bladder tack, no incontinence. Walking is difficulty, feels over balanced, slows and has to calculates her steps usually falls backwards. Has a history of tendonitis in her ankle, present in the afternoon. Bending and stooping is painful, hard time reaching the floor. Grocery shopping she props  on the buggy. Told osteoarthritis. [place]. She fell 3 weeks ago and then again a few weeks ago. [place] forward tripping with two bags of clothes carrying for some family. [place] and hit her Has had a bone density test and is due another, she was told she is borderline and has had last one in [place]. Numbness for the calf downwards into the top and bottom of the feets.Longer she is up the more the legs hurts.     Review of Systems  Constitutional: Positive for activity change and unexpected weight change.  HENT: Positive for congestion, dental problem, rhinorrhea and sinus pressure. Negative for drooling, ear discharge, ear pain, hearing loss, nosebleeds, sinus pain, tinnitus, trouble swallowing and voice change.   Eyes: Positive for itching. Negative for photophobia, pain, redness and visual disturbance.  Respiratory:  Negative for apnea, cough, choking, chest tightness, shortness of breath, wheezing and stridor.   Cardiovascular: Positive for leg swelling. Negative for chest pain and palpitations.  Gastrointestinal: Negative for abdominal distention, abdominal pain, anal bleeding, blood in stool, constipation, diarrhea and nausea.  Endocrine: Positive for cold intolerance and heat intolerance.  Genitourinary: Negative for difficulty urinating, dyspareunia, dysuria, enuresis, flank pain, frequency and hematuria.  Musculoskeletal: Positive for back pain, neck pain and neck stiffness. Negative for arthralgias, gait problem and joint swelling.  Skin: Negative for color change, pallor, rash and wound.  Allergic/Immunologic: Positive for environmental allergies. Negative for food allergies and immunocompromised state.  Neurological: Positive for syncope and weakness. Negative for dizziness, tremors, seizures, facial asymmetry, speech difficulty, light-headedness, numbness and headaches.  Hematological: Positive for adenopathy. Does not bruise/bleed easily.   Psychiatric/Behavioral: Positive for sleep disturbance. Negative for agitation, behavioral problems, confusion, decreased concentration, dysphoric mood, hallucinations, self-injury and suicidal ideas. The patient is not nervous/anxious and is not hyperactive.      Objective: Vital Signs: BP (!) 153/68 (BP Location: Left Arm, Patient Position: Sitting)   Pulse 63   Ht 5' 1.5" (1.562 m)   Wt 130 lb (59 kg)   BMI 24.17 kg/m   Physical Exam  Constitutional: She is oriented to person, place, and time. No distress.  HENT:  Head: Atraumatic.  Eyes: Right eye exhibits no discharge. Left eye exhibits no discharge.  Neck: Normal range of motion. Neck supple.  Pulmonary/Chest: Effort normal and breath sounds normal.  Abdominal: She exhibits no distension and no mass. There is no tenderness. There is no rebound and no guarding. No hernia.  Musculoskeletal: She exhibits no edema, tenderness or deformity.  Neurological: She is alert and oriented to person, place, and time.  Skin: Skin is warm and dry. She is not diaphoretic.  Psychiatric: She has a normal mood and affect. Her behavior is normal. Judgment and thought content normal.    Ortho Exam  Specialty Comments:  No specialty comments available.  Imaging: No results found.   PMFS History: Patient Active Problem List   Diagnosis Date Noted  . Rheumatoid arthritis involving multiple sites with positive rheumatoid factor ([place]) 07/14/2016  . High risk medication use 07/14/2016  . Sjogren's syndrome with keratoconjunctivitis sicca ([place]) 07/14/2016  . Raynaud's disease without gangrene 07/14/2016  . Achilles tendinitis of both lower extremities 07/14/2016  . Fibromyalgia 07/14/2016  . Anxiety and depression 07/14/2016  . History of asthma 07/14/2016  . Environmental allergies 07/14/2016  . Gastroesophageal reflux disease without esophagitis 07/14/2016  . Neuropathy 07/14/2016  . Osteopenia of multiple sites 07/14/2016  . Pain in  both hands 07/14/2016  . Pain in both feet 07/14/2016   No past medical history on file.  Family History  Problem Relation Age of Onset  . Heart failure Mother   . Heart failure Father   . Healthy Daughter   . Healthy Daughter   . Healthy Son     Past Surgical History:  Procedure Laterality Date  . ABDOMINAL HYSTERECTOMY    . BLADDER SURGERY     Social History   Occupational History  . Not on file  Tobacco Use  . Smoking status: Never Smoker  . Smokeless tobacco: Never Used  Substance and Sexual Activity  . Alcohol use: No  . Drug use: No  . Sexual activity: Not on file

## 2017-07-25 NOTE — Patient Instructions (Signed)
Avoid frequent bending and stooping  No lifting greater than 10 lbs. May use ice or moist heat for pain. Weight loss is of benefit. Handicap license is approved. 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 Well padded shoes help. Ice the knee 2-3 times a day 15-20 mins at a time. Fall Prevention and Home Safety Falls cause injuries and can affect all age groups. It is possible to use preventive measures to significantly decrease the likelihood of falls. There are many simple measures which can make your home safer and prevent falls. OUTDOORS  Repair cracks and edges of walkways and driveways.  Remove high doorway thresholds.  Trim shrubbery on the main path into your home.  Have good outside lighting.  Clear walkways of tools, rocks, debris, and clutter.  Check that handrails are not broken and are securely fastened. Both sides of steps should have handrails.  Have leaves, snow, and ice cleared regularly.  Use sand or salt on walkways during winter months.  In the garage, clean up grease or oil spills. BATHROOM  Install night lights.  Install grab bars by the toilet and in the tub and shower.  Use non-skid mats or decals in the tub or shower.  Place a plastic non-slip stool in the shower to sit on, if needed.  Keep floors dry and clean up all water on the floor immediately.  Remove soap buildup in the tub or shower on a regular basis.  Secure bath mats with non-slip, double-sided rug tape.  Remove throw rugs and tripping hazards from the floors. BEDROOMS  Install night lights.  Make sure a bedside light is easy to reach.  Do not use oversized bedding.  Keep a telephone by your bedside.  Have a firm chair with side arms to use for getting dressed.  Remove throw rugs and tripping hazards from the floor. KITCHEN  Keep handles on pots and pans turned toward the center of the stove. Use back burners when  possible.  Clean up spills quickly and allow time for drying.  Avoid walking on wet floors.  Avoid hot utensils and knives.  Position shelves so they are not too high or low.  Place commonly used objects within easy reach.  If necessary, use a sturdy step stool with a grab bar when reaching.  Keep electrical cables out of the way.  Do not use floor polish or wax that makes floors slippery. If you must use wax, use non-skid floor wax.  Remove throw rugs and tripping hazards from the floor. STAIRWAYS  Never leave objects on stairs.  Place handrails on both sides of stairways and use them. Fix any loose handrails. Make sure handrails on both sides of the stairways are as long as the stairs.  Check carpeting to make sure it is firmly attached along stairs. Make repairs to worn or loose carpet promptly.  Avoid placing throw rugs at the top or bottom of stairways, or properly secure the rug with carpet tape to prevent slippage. Get rid of throw rugs, if possible.  Have an electrician put in a light switch at the top and bottom of the stairs. OTHER FALL PREVENTION TIPS  Wear low-heel or rubber-soled shoes that are supportive and fit well. Wear closed toe shoes.  When using a stepladder, make sure it is fully opened and both spreaders are firmly locked. Do not climb a closed stepladder.  Add color or contrast paint or tape to grab bars and handrails  in your home. Place contrasting color strips on first and last steps.  Learn and use mobility aids as needed. Install an electrical emergency response system.  Turn on lights to avoid dark areas. Replace light bulbs that burn out immediately. Get light switches that glow.  Arrange furniture to create clear pathways. Keep furniture in the same place.  Firmly attach carpet with non-skid or double-sided tape.  Eliminate uneven floor surfaces.  Select a carpet pattern that does not visually hide the edge of steps.  Be aware of all  pets. OTHER HOME SAFETY TIPS  Set the water temperature for 120 F (48.8 C).  Keep emergency numbers on or near the telephone.  Keep smoke detectors on every level of the home and near sleeping areas. Document Released: 06/10/2002 Document Revised: 12/20/2011 Document Reviewed: 09/09/2011 [facility] Patient Information 2014 [place].

## 2017-08-04 ENCOUNTER — Ambulatory Visit (INDEPENDENT_AMBULATORY_CARE_PROVIDER_SITE_OTHER): Payer: PPO | Admitting: Specialist

## 2017-08-05 ENCOUNTER — Ambulatory Visit
Admission: RE | Admit: 2017-08-05 | Discharge: 2017-08-05 | Disposition: A | Discharge status: Home / Self Care | Payer: PPO | Source: Ambulatory Visit | Attending: Specialist | Admitting: Specialist

## 2017-08-05 DIAGNOSIS — M5441 Lumbago with sciatica, right side: Principal | ICD-10-CM

## 2017-08-05 DIAGNOSIS — M5136 Other intervertebral disc degeneration, lumbar region: Secondary | ICD-10-CM

## 2017-08-05 DIAGNOSIS — G9519 Other vascular myelopathies: Secondary | ICD-10-CM

## 2017-08-05 DIAGNOSIS — G8929 Other chronic pain: Secondary | ICD-10-CM

## 2017-08-05 DIAGNOSIS — M5442 Lumbago with sciatica, left side: Principal | ICD-10-CM

## 2017-08-05 DIAGNOSIS — M48062 Spinal stenosis, lumbar region with neurogenic claudication: Secondary | ICD-10-CM

## 2017-08-05 DIAGNOSIS — M48061 Spinal stenosis, lumbar region without neurogenic claudication: Secondary | ICD-10-CM | POA: Diagnosis not present

## 2017-08-10 ENCOUNTER — Telehealth (INDEPENDENT_AMBULATORY_CARE_PROVIDER_SITE_OTHER): Payer: Self-pay | Admitting: Specialist

## 2017-08-10 NOTE — Telephone Encounter (Signed)
[facility] called needing authorization to fill RX of Tramadol due to patient also being on Lorazepam, she said there is an increased chance of accidental overdose when taken together. Please call pharmacy back # [phone]

## 2017-08-10 NOTE — Telephone Encounter (Signed)
[name] called needing authorization to fill RX of Tramadol due to patient also being on Lorazepam, she said there is an increased chance of accidental overdose when taken together.

## 2017-08-14 NOTE — Telephone Encounter (Signed)
Pharmacy has been advised.

## 2017-08-14 NOTE — Telephone Encounter (Signed)
Done, tramadol is a reasonable medication to be use with the lorazepam other narcotics are even more  Worrisome for side effects. Ie. Sedation.

## 2017-08-18 ENCOUNTER — Encounter (INDEPENDENT_AMBULATORY_CARE_PROVIDER_SITE_OTHER): Payer: Self-pay | Admitting: Specialist

## 2017-08-18 ENCOUNTER — Ambulatory Visit (INDEPENDENT_AMBULATORY_CARE_PROVIDER_SITE_OTHER): Payer: PPO | Admitting: Specialist

## 2017-08-18 VITALS — BP 140/65 | HR 67 | Ht 61.5 in | Wt 130.0 lb

## 2017-08-18 DIAGNOSIS — M5136 Other intervertebral disc degeneration, lumbar region: Secondary | ICD-10-CM | POA: Diagnosis not present

## 2017-08-18 DIAGNOSIS — M48062 Spinal stenosis, lumbar region with neurogenic claudication: Secondary | ICD-10-CM

## 2017-08-18 DIAGNOSIS — M5116 Intervertebral disc disorders with radiculopathy, lumbar region: Secondary | ICD-10-CM

## 2017-08-18 MED ORDER — TRAMADOL HCL 50 MG PO TABS: 50.0000 mg | ORAL_TABLET | Freq: Four times a day (QID) | ORAL | 0 refills | Status: DC | PRN

## 2017-08-18 NOTE — Patient Instructions (Signed)
Avoid bending, stooping and avoid lifting weights greater than 10 lbs. Avoid prolong standing and walking. Avoid frequent bending and stooping  No lifting greater than 10 lbs. May use ice or moist heat for pain. Weight loss is of benefit. Handicap license is approved. Dr. Newton's secretary/Assistant will call to arrange for epidural steroid injection  

## 2017-08-18 NOTE — Progress Notes (Signed)
Office Visit Note   Patient: Brenda Hanna           Date of Birth: Apr 21, 1941           MRN: [id number] Visit Date: 08/18/2017              Requested by: Myrlene Broker, MD [place], [place] [address] PCP: Myrlene Broker, MD   Assessment & Plan: Visit Diagnoses:  1. Spinal stenosis of lumbar region with neurogenic claudication   2. Herniation of lumbar intervertebral disc with radiculopathy   3. Degenerative disc disease, lumbar     Plan: Avoid bending, stooping and avoid lifting weights greater than 10 lbs. Avoid prolong standing and walking. Avoid frequent bending and stooping  No lifting greater than 10 lbs. May use ice or moist heat for pain. Weight loss is of benefit. Handicap license is approved. Dr. Romona Curls secretary/Assistant will call to arrange for epidural steroid injection   Follow-Up Instructions: No Follow-up on file.   Orders:  No orders of the defined types were placed in this encounter.  No orders of the defined types were placed in this encounter.     Procedures: No procedures performed   Clinical Data: No additional findings.   Subjective: Chief Complaint  Patient presents with  . Lower Back - Follow-up    MRI Review    77 year old female with painful lumbar spine with standing and walking and is worse with sitting and riding    Review of Systems  Constitutional: Negative.   HENT: Negative.   Eyes: Negative.   Respiratory: Negative.   Cardiovascular: Negative.   Gastrointestinal: Negative.   Endocrine: Negative.   Genitourinary: Negative.   Musculoskeletal: Negative.   Skin: Negative.   Allergic/Immunologic: Negative.   Neurological: Negative.   Hematological: Negative.   Psychiatric/Behavioral: Negative.      Objective: Vital Signs: BP 140/65 (BP Location: Left Arm, Patient Position: Sitting)   Pulse 67   Ht 5' 1.5" (1.562 m)   Wt 130 lb (59 kg)   BMI 24.17 kg/m   Physical Exam    Constitutional: She is oriented to person, place, and time. She appears well-developed and well-nourished.  HENT:  Head: Normocephalic and atraumatic.  Eyes: EOM are normal. Pupils are equal, round, and reactive to light.  Neck: Normal range of motion. Neck supple.  Pulmonary/Chest: Effort normal and breath sounds normal.  Abdominal: Soft. Bowel sounds are normal.  Musculoskeletal: Normal range of motion.  Neurological: She is alert and oriented to person, place, and time.  Skin: Skin is warm and dry.  Psychiatric: She has a normal mood and affect. Her behavior is normal. Judgment and thought content normal.    Ortho Exam  Specialty Comments:  No specialty comments available.  Imaging: No results found.   PMFS History: Patient Active Problem List   Diagnosis Date Noted  . Rheumatoid arthritis involving multiple sites with positive rheumatoid factor ([place]) 07/14/2016  . High risk medication use 07/14/2016  . Sjogren's syndrome with keratoconjunctivitis sicca ([place]) 07/14/2016  . Raynaud's disease without gangrene 07/14/2016  . Achilles tendinitis of both lower extremities 07/14/2016  . Fibromyalgia 07/14/2016  . Anxiety and depression 07/14/2016  . History of asthma 07/14/2016  . Environmental allergies 07/14/2016  . Gastroesophageal reflux disease without esophagitis 07/14/2016  . Neuropathy 07/14/2016  . Osteopenia of multiple sites 07/14/2016  . Pain in both hands 07/14/2016  . Pain in both feet 07/14/2016   History  reviewed. No pertinent past medical history.  Family History  Problem Relation Age of Onset  . Heart failure Mother   . Heart failure Father   . Healthy Daughter   . Healthy Daughter   . Healthy Son     Past Surgical History:  Procedure Laterality Date  . ABDOMINAL HYSTERECTOMY    . BLADDER SURGERY     Social History   Occupational History  . Not on file  Tobacco Use  . Smoking status: Never Smoker  . Smokeless tobacco: Never Used  Substance  and Sexual Activity  . Alcohol use: No  . Drug use: No  . Sexual activity: Not on file

## 2017-09-06 ENCOUNTER — Ambulatory Visit (INDEPENDENT_AMBULATORY_CARE_PROVIDER_SITE_OTHER): Payer: PPO

## 2017-09-06 ENCOUNTER — Ambulatory Visit (INDEPENDENT_AMBULATORY_CARE_PROVIDER_SITE_OTHER): Payer: PPO | Admitting: Physical Medicine and Rehabilitation

## 2017-09-06 ENCOUNTER — Encounter (INDEPENDENT_AMBULATORY_CARE_PROVIDER_SITE_OTHER): Payer: Self-pay | Admitting: Physical Medicine and Rehabilitation

## 2017-09-06 VITALS — BP 171/83 | HR 66 | Temp 97.8°F

## 2017-09-06 DIAGNOSIS — M48062 Spinal stenosis, lumbar region with neurogenic claudication: Secondary | ICD-10-CM | POA: Diagnosis not present

## 2017-09-06 DIAGNOSIS — M5416 Radiculopathy, lumbar region: Secondary | ICD-10-CM

## 2017-09-06 DIAGNOSIS — M5116 Intervertebral disc disorders with radiculopathy, lumbar region: Secondary | ICD-10-CM

## 2017-09-06 MED ORDER — BETAMETHASONE SOD PHOS & ACET 6 (3-3) MG/ML IJ SUSP
12.0000 mg | Freq: Once | INTRAMUSCULAR | Status: AC
  Administered 2017-09-06: 12 mg

## 2017-09-06 NOTE — Progress Notes (Signed)
.  Numeric Pain Rating Scale and Functional Assessment Average Pain 9   In the last MONTH (on 0-10 scale) has pain interfered with the following?  1. General activity like being  able to carry out your everyday physical activities such as walking, climbing stairs, carrying groceries, or moving a chair?  Rating(8)   +Driver, -BT, -Dye Allergies.  

## 2017-09-06 NOTE — Patient Instructions (Signed)

## 2017-09-06 NOTE — Progress Notes (Signed)
Brenda Hanna - 77 y.o. female MRN [id number]  Date of birth: June 04, 1941  Office Visit Note: Visit Date: 09/06/2017 PCP: Myrlene Broker, MD Referred by: Myrlene Broker, MD  Subjective: Chief Complaint  Patient presents with  . Middle Back - Pain  . Lower Back - Pain   HPI: Brenda Hanna is a 77 year old female with disc extrusion at L4-5 with symptoms of low back and middle back pain with some referral in the legs.  She still really conservative care through Dr. Louanne Skye he request diagnostic and hopefully therapeutic bilateral L4 transforaminal epidural steroid injection.  The injection  will be diagnostic and hopefully therapeutic. The patient has failed conservative care including time, medications and activity modification.    ROS Otherwise per HPI.  Assessment & Plan: Visit Diagnoses:  1. Lumbar radiculopathy   2. Radiculopathy due to lumbar intervertebral disc disorder   3. Spinal stenosis of lumbar region with neurogenic claudication     Plan: No additional findings.   Meds & Orders:  Meds ordered this encounter  Medications  . betamethasone acetate-betamethasone sodium phosphate (CELESTONE) injection 12 mg    Orders Placed This Encounter  Procedures  . XR C-ARM NO REPORT  . Epidural Steroid injection    Follow-up: Return for Dr. Louanne Skye.   Procedures: No procedures performed  Lumbosacral Transforaminal Epidural Steroid Injection - Sub-Pedicular Approach with Fluoroscopic Guidance  Patient: Brenda Hanna      Date of Birth: [birth year] MRN: [id number] PCP: Myrlene Broker, MD      Visit Date: 09/06/2017   Universal Protocol:    Date/Time: 09/06/2017  Consent Given By: the patient  Position: PRONE  Additional Comments: Vital signs were monitored before and after the procedure. Patient was prepped and draped in the usual sterile fashion. The correct patient, procedure, and site was verified.   Injection Procedure Details:  Procedure Site  One Meds Administered:  Meds ordered this encounter  Medications  . betamethasone acetate-betamethasone sodium phosphate (CELESTONE) injection 12 mg    Laterality: Bilateral  Location/Site:  L4-L5  Needle size: 22 G  Needle type: Spinal  Needle Placement: Transforaminal  Findings:    -Comments: Excellent flow of contrast along the nerve and into the epidural space.  Procedure Details: After squaring off the end-plates to get a true AP view, the C-arm was positioned so that an oblique view of the foramen as noted above was visualized. The target area is just inferior to the "nose of the scotty dog" or sub pedicular. The soft tissues overlying this structure were infiltrated with 2-3 ml. of 1% Lidocaine without Epinephrine.  The spinal needle was inserted toward the target using a "trajectory" view along the fluoroscope beam.  Under AP and lateral visualization, the needle was advanced so it did not puncture dura and was located close the 6 O'Clock position of the pedical in AP tracterory. Biplanar projections were used to confirm position. Aspiration was confirmed to be negative for CSF and/or blood. A 1-2 ml. volume of Isovue-250 was injected and flow of contrast was noted at each level. Radiographs were obtained for documentation purposes.   After attaining the desired flow of contrast documented above, a 0.5 to 1.0 ml test dose of 0.25% Marcaine was injected into each respective transforaminal space.  The patient was observed for 90 seconds post injection.  After no sensory deficits were reported, and normal lower extremity motor function was noted,   the above injectate was administered so that equal amounts of  the injectate were placed at each foramen (level) into the transforaminal epidural space.   Additional Comments:  The patient tolerated the procedure well Dressing: Band-Aid    Post-procedure details: Patient was observed during the procedure. Post-procedure  instructions were reviewed.  Patient left the clinic in stable condition.    Clinical History: MRI LUMBAR SPINE WITHOUT CONTRAST  TECHNIQUE: Multiplanar, multisequence MR imaging of the lumbar spine was performed. No intravenous contrast was administered.  COMPARISON:  None.  FINDINGS: Segmentation:  Standard.  Alignment:  Physiologic.  Vertebrae:  No fracture, evidence of discitis, or bone lesion.  Conus medullaris and cauda equina: Conus extends to the T12 level. Conus and cauda equina appear normal.  Paraspinal and other soft tissues: No acute paraspinal abnormality. Partially visualize is a 4.5 cm T2 hyperintense left upper pole renal mass most consistent with a cyst.  Disc levels:  Disc spaces: Degenerative disc disease with severe disc height loss and reactive endplate changes at J8-8. Milder degenerative disc disease with disc height loss at L3-4.  T12-L1: No significant disc bulge. No evidence of neural foraminal stenosis. No central canal stenosis.  L1-L2: No significant disc bulge. No evidence of neural foraminal stenosis. No central canal stenosis.  L2-L3: No significant disc bulge. No evidence of neural foraminal stenosis. No central canal stenosis.  L3-L4: Broad-based disc bulge eccentric towards the left. Moderate bilateral facet arthropathy with ligamentum flavum infolding resulting in mild spinal stenosis. Bilateral lateral recess stenosis, left greater than right. No evidence of neural foraminal stenosis.  L4-L5: Broad-based disc bulge with a central subligamentous disc extrusion with cephalad migration of disc material. Moderate bilateral facet arthropathy. Bilateral lateral recess narrowing, left greater than right. Moderate right foraminal stenosis. Mild left foraminal stenosis.  L5-S1: No significant disc bulge. No evidence of neural foraminal stenosis. No central canal stenosis. Moderate right and mild left facet  arthropathy.  IMPRESSION: 1. At L3-4 there is a broad-based disc bulge eccentric towards the left. Moderate bilateral facet arthropathy with ligamentum flavum infolding resulting in mild spinal stenosis. Bilateral lateral recess stenosis, left greater than right. 2. At L4-5 there is a broad-based disc bulge with a central subligamentous disc extrusion with cephalad migration of disc material. Moderate bilateral facet arthropathy. Bilateral lateral recess narrowing, left greater than right. Moderate right foraminal stenosis. Mild left foraminal stenosis.   Electronically Signed   By: Kathreen Devoid   On: 08/05/2017 12:32  She reports that  has never smoked. she has never used smokeless tobacco. No results for input(s): HGBA1C, LABURIC in the last 8760 hours.  Objective:  VS:  HT:    WT:   BMI:     BP:(!) 171/83  HR:66bpm  TEMP:97.8 F (36.6 C)(Oral)  RESP:99 % Physical Exam  Ortho Exam Imaging: No results found.  Past Medical/Family/Surgical/Social History: Medications & Allergies reviewed per EMR Patient Active Problem List   Diagnosis Date Noted  . Rheumatoid arthritis involving multiple sites with positive rheumatoid factor ([place]) 07/14/2016  . High risk medication use 07/14/2016  . Sjogren's syndrome with keratoconjunctivitis sicca ([place]) 07/14/2016  . Raynaud's disease without gangrene 07/14/2016  . Achilles tendinitis of both lower extremities 07/14/2016  . Fibromyalgia 07/14/2016  . Anxiety and depression 07/14/2016  . History of asthma 07/14/2016  . Environmental allergies 07/14/2016  . Gastroesophageal reflux disease without esophagitis 07/14/2016  . Neuropathy 07/14/2016  . Osteopenia of multiple sites 07/14/2016  . Pain in both hands 07/14/2016  . Pain in both feet 07/14/2016   History reviewed. No  pertinent past medical history. Family History  Problem Relation Age of Onset  . Heart failure Mother   . Heart failure Father   . Healthy Daughter   .  Healthy Daughter   . Healthy Son    Past Surgical History:  Procedure Laterality Date  . ABDOMINAL HYSTERECTOMY    . BLADDER SURGERY     Social History   Occupational History  . Not on file  Tobacco Use  . Smoking status: Never Smoker  . Smokeless tobacco: Never Used  Substance and Sexual Activity  . Alcohol use: No  . Drug use: No  . Sexual activity: Not on file

## 2017-09-06 NOTE — Procedures (Signed)
Lumbosacral Transforaminal Epidural Steroid Injection - Sub-Pedicular Approach with Fluoroscopic Guidance  Patient: Brenda Hanna      Date of Birth: 1940/07/09 MRN: [id number] PCP: Myrlene Broker, MD      Visit Date: 09/06/2017   Universal Protocol:    Date/Time: 09/06/2017  Consent Given By: the patient  Position: PRONE  Additional Comments: Vital signs were monitored before and after the procedure. Patient was prepped and draped in the usual sterile fashion. The correct patient, procedure, and site was verified.   Injection Procedure Details:  Procedure Site One Meds Administered:  Meds ordered this encounter  Medications  . betamethasone acetate-betamethasone sodium phosphate (CELESTONE) injection 12 mg    Laterality: Bilateral  Location/Site:  L4-L5  Needle size: 22 G  Needle type: Spinal  Needle Placement: Transforaminal  Findings:    -Comments: Excellent flow of contrast along the nerve and into the epidural space.  Procedure Details: After squaring off the end-plates to get a true AP view, the C-arm was positioned so that an oblique view of the foramen as noted above was visualized. The target area is just inferior to the "nose of the scotty dog" or sub pedicular. The soft tissues overlying this structure were infiltrated with 2-3 ml. of 1% Lidocaine without Epinephrine.  The spinal needle was inserted toward the target using a "trajectory" view along the fluoroscope beam.  Under AP and lateral visualization, the needle was advanced so it did not puncture dura and was located close the 6 O'Clock position of the pedical in AP tracterory. Biplanar projections were used to confirm position. Aspiration was confirmed to be negative for CSF and/or blood. A 1-2 ml. volume of Isovue-250 was injected and flow of contrast was noted at each level. Radiographs were obtained for documentation purposes.   After attaining the desired flow of contrast documented above, a  0.5 to 1.0 ml test dose of 0.25% Marcaine was injected into each respective transforaminal space.  The patient was observed for 90 seconds post injection.  After no sensory deficits were reported, and normal lower extremity motor function was noted,   the above injectate was administered so that equal amounts of the injectate were placed at each foramen (level) into the transforaminal epidural space.   Additional Comments:  The patient tolerated the procedure well Dressing: Band-Aid    Post-procedure details: Patient was observed during the procedure. Post-procedure instructions were reviewed.  Patient left the clinic in stable condition.

## 2017-09-15 DIAGNOSIS — M5136 Other intervertebral disc degeneration, lumbar region: Secondary | ICD-10-CM | POA: Diagnosis not present

## 2017-09-15 DIAGNOSIS — F419 Anxiety disorder, unspecified: Secondary | ICD-10-CM | POA: Diagnosis not present

## 2017-09-15 DIAGNOSIS — M5116 Intervertebral disc disorders with radiculopathy, lumbar region: Secondary | ICD-10-CM | POA: Diagnosis not present

## 2017-09-15 DIAGNOSIS — F5104 Psychophysiologic insomnia: Secondary | ICD-10-CM | POA: Diagnosis not present

## 2017-09-21 ENCOUNTER — Encounter (INDEPENDENT_AMBULATORY_CARE_PROVIDER_SITE_OTHER): Payer: Self-pay | Admitting: Specialist

## 2017-09-21 ENCOUNTER — Ambulatory Visit (INDEPENDENT_AMBULATORY_CARE_PROVIDER_SITE_OTHER): Payer: PPO | Admitting: Specialist

## 2017-09-21 ENCOUNTER — Telehealth (INDEPENDENT_AMBULATORY_CARE_PROVIDER_SITE_OTHER): Payer: Self-pay | Admitting: Specialist

## 2017-09-21 VITALS — BP 143/70 | HR 66 | Ht 61.5 in | Wt 135.0 lb

## 2017-09-21 DIAGNOSIS — M5116 Intervertebral disc disorders with radiculopathy, lumbar region: Secondary | ICD-10-CM | POA: Diagnosis not present

## 2017-09-21 MED ORDER — TRAMADOL HCL 50 MG PO TABS: 50.0000 mg | ORAL_TABLET | Freq: Four times a day (QID) | ORAL | 0 refills | Status: DC | PRN

## 2017-09-21 NOTE — Patient Instructions (Signed)
Avoid frequent bending and stooping  No lifting greater than 10 lbs. May use ice or moist heat for pain. Weight loss is of benefit. Handicap license is approved.   

## 2017-09-21 NOTE — Telephone Encounter (Signed)
Please sched pt if earlier appt opens up

## 2017-09-21 NOTE — Progress Notes (Signed)
Office Visit Note   Patient: Brenda Hanna           Date of Birth: [birth year]           MRN: [id number] Visit Date: 09/21/2017              Requested by: Myrlene Broker, MD [place], [place] [address] PCP: Myrlene Broker, MD   Assessment & Plan: Visit Diagnoses:  1. Lumbar disc herniation with radiculopathy     Plan: Avoid frequent bending and stooping  No lifting greater than 10 lbs. May use ice or moist heat for pain. Weight loss is of benefit. Handicap license is approved.  Follow-Up Instructions: Return in about 3 weeks (around 10/12/2017).   Orders:  Orders Placed This Encounter  Procedures  . Ambulatory referral to Physical Medicine Rehab   No orders of the defined types were placed in this encounter.     Procedures: No procedures performed   Clinical Data: No additional findings.   Subjective: Chief Complaint  Patient presents with  . Lower Back - Pain  . Back Pain    Pt stated lower still painful especially when standing.    77 year old female with significant back pain and radiation into the right leg greater than left pain is improved with standing and walking. Worsened with  sitting, bending and stooping. No bowel or bladder difficulty.   Review of Systems  Constitutional: Negative.   HENT: Negative.   Eyes: Negative.   Respiratory: Negative.   Cardiovascular: Negative.   Gastrointestinal: Negative.   Endocrine: Negative.   Genitourinary: Negative.   Musculoskeletal: Negative.   Skin: Negative.   Allergic/Immunologic: Negative.   Neurological: Negative.   Hematological: Negative.   Psychiatric/Behavioral: Negative.      Objective: Vital Signs: BP (!) 143/70   Pulse 66   Wt 135 lb (61.2 kg)   BMI 25.09 kg/m   Physical Exam  Constitutional: She is oriented to person, place, and time. She appears well-developed and well-nourished.  HENT:  Head: Normocephalic and atraumatic.  Eyes: Pupils are equal,  round, and reactive to light. EOM are normal.  Neck: Normal range of motion. Neck supple.  Pulmonary/Chest: Effort normal and breath sounds normal.  Abdominal: Soft. Bowel sounds are normal.  Neurological: She is alert and oriented to person, place, and time.  Skin: Skin is warm and dry.  Psychiatric: She has a normal mood and affect. Her behavior is normal. Judgment and thought content normal.    Back Exam   Tenderness  The patient is experiencing tenderness in the lumbar.  Range of Motion  Extension: abnormal  Flexion: normal  Lateral bend right: abnormal  Lateral bend left: abnormal  Rotation right: abnormal  Rotation left: abnormal   Muscle Strength  Right Quadriceps:  5/5  Left Quadriceps:  5/5  Right Hamstrings:  5/5  Left Hamstrings:  5/5   Tests  Straight leg raise right: positive Straight leg raise left: negative  Reflexes  Patellar: normal Achilles: normal Biceps: normal Babinski's sign: normal   Other  Toe walk: normal Heel walk: normal Sensation: normal Gait: normal  Erythema: no back redness Scars: present      Specialty Comments:  No specialty comments available.  Imaging: No results found.   PMFS History: Patient Active Problem List   Diagnosis Date Noted  . Rheumatoid arthritis involving multiple sites with positive rheumatoid factor ([place]) 07/14/2016  . High risk medication use 07/14/2016  .  Sjogren's syndrome with keratoconjunctivitis sicca ([place]) 07/14/2016  . Raynaud's disease without gangrene 07/14/2016  . Achilles tendinitis of both lower extremities 07/14/2016  . Fibromyalgia 07/14/2016  . Anxiety and depression 07/14/2016  . History of asthma 07/14/2016  . Environmental allergies 07/14/2016  . Gastroesophageal reflux disease without esophagitis 07/14/2016  . Neuropathy 07/14/2016  . Osteopenia of multiple sites 07/14/2016  . Pain in both hands 07/14/2016  . Pain in both feet 07/14/2016   No past medical history on file.    Family History  Problem Relation Age of Onset  . Heart failure Mother   . Heart failure Father   . Healthy Daughter   . Healthy Daughter   . Healthy Son     Past Surgical History:  Procedure Laterality Date  . ABDOMINAL HYSTERECTOMY    . BLADDER SURGERY     Social History   Occupational History  . Not on file  Tobacco Use  . Smoking status: Never Smoker  . Smokeless tobacco: Never Used  Substance and Sexual Activity  . Alcohol use: No  . Drug use: No  . Sexual activity: Not on file

## 2017-09-22 NOTE — Telephone Encounter (Signed)
I have put patient on cancellation list

## 2017-10-17 ENCOUNTER — Other Ambulatory Visit (INDEPENDENT_AMBULATORY_CARE_PROVIDER_SITE_OTHER): Payer: Self-pay | Admitting: Specialist

## 2017-10-17 NOTE — Telephone Encounter (Signed)
Tramadol Refill Request 

## 2017-10-18 ENCOUNTER — Ambulatory Visit (INDEPENDENT_AMBULATORY_CARE_PROVIDER_SITE_OTHER): Payer: PPO

## 2017-10-18 ENCOUNTER — Encounter (INDEPENDENT_AMBULATORY_CARE_PROVIDER_SITE_OTHER): Payer: Self-pay | Admitting: Physical Medicine and Rehabilitation

## 2017-10-18 ENCOUNTER — Ambulatory Visit (INDEPENDENT_AMBULATORY_CARE_PROVIDER_SITE_OTHER): Payer: PPO | Admitting: Physical Medicine and Rehabilitation

## 2017-10-18 VITALS — BP 162/72 | HR 64 | Temp 98.0°F

## 2017-10-18 DIAGNOSIS — M5416 Radiculopathy, lumbar region: Secondary | ICD-10-CM

## 2017-10-18 MED ORDER — BETAMETHASONE SOD PHOS & ACET 6 (3-3) MG/ML IJ SUSP
12.0000 mg | Freq: Once | INTRAMUSCULAR | Status: AC
  Administered 2017-10-18: 12 mg

## 2017-10-18 NOTE — Patient Instructions (Signed)

## 2017-10-18 NOTE — Progress Notes (Signed)
 .  Numeric Pain Rating Scaljs se and Functional Assessment Average Pain 10   In the last MONTH (on 0-10 scale) has pain interfered with the following?  1. General activity like being  able to carry out your everyday physical activities such as walking, climbing stairs, carrying groceries, or moving a chair?  Rating(6)   +Driver, -BT, +Dye Allergies (contrast media).

## 2017-10-19 NOTE — Telephone Encounter (Signed)
Patient requesting refill on tramadol, sent to [facility] in [place].

## 2017-10-19 NOTE — Telephone Encounter (Signed)
Called to [facility] 

## 2017-10-19 NOTE — Telephone Encounter (Signed)
Dr. Louanne Skye has a request already for this

## 2017-10-27 NOTE — Progress Notes (Signed)
Brenda Hanna - 77 y.o. female MRN [id number]  Date of birth: [birth year]  Office Visit Note: Visit Date: 10/18/2017 PCP: Myrlene Broker, MD Referred by: Myrlene Broker, MD  Subjective: Chief Complaint  Patient presents with  . Lower Back - Pain  . Right Leg - Pain  . Left Leg - Pain  . Right Foot - Numbness  . Left Foot - Numbness   HPI: Brenda Hanna is a 77 year old female comes in today with her husband and daughter and comes in at the request of Dr. Louanne Skye for right L4 transforaminal epidural steroid injection.  We saw the patient in March and completed bilateral L4 transforaminal epidural steroid injection with some relief but short-lived.  The family seems very frustrated with the fact that she is not doing well and the shots did not help.  In point of fact we have completed just the one injection.  Dr. Louanne Skye requested a right sided injection but she does have pain in the back and buttocks and down both right and left legs that she states are fairly equal may be a little bit more right than left.  She reports any pain makes her pain worse.  Her course is complicated by rheumatoid arthritis and Raynaud's disease and Sjogren's syndrome.  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  We will repeat the bilateral L4 transforaminal injection.  Consideration could be given to facet joint blocks.   ROS Otherwise per HPI.  Assessment & Plan: Visit Diagnoses:  1. Lumbar radiculopathy     Plan: No additional findings.   Meds & Orders:  Meds ordered this encounter  Medications  . betamethasone acetate-betamethasone sodium phosphate (CELESTONE) injection 12 mg    Orders Placed This Encounter  Procedures  . XR C-ARM NO REPORT  . Epidural Steroid injection    Follow-up: Return for Dr. Louanne Skye.   Procedures: No procedures performed  Lumbosacral Transforaminal Epidural Steroid Injection - Sub-Pedicular Approach with Fluoroscopic  Guidance  Patient: Brenda Hanna      Date of Birth: 15-Oct-1940 MRN: [id number] PCP: Myrlene Broker, MD      Visit Date: 10/18/2017   Universal Protocol:    Date/Time: 10/18/2017  Consent Given By: the patient  Position: PRONE  Additional Comments: Vital signs were monitored before and after the procedure. Patient was prepped and draped in the usual sterile fashion. The correct patient, procedure, and site was verified.   Injection Procedure Details:  Procedure Site One Meds Administered:  Meds ordered this encounter  Medications  . betamethasone acetate-betamethasone sodium phosphate (CELESTONE) injection 12 mg    Laterality: Bilateral  Location/Site:  L4-L5  Needle size: 22 G  Needle type: Spinal  Needle Placement: Transforaminal  Findings:    -Comments: Excellent flow of contrast along the nerve and into the epidural space.  Procedure Details: After squaring off the end-plates to get a true AP view, the C-arm was positioned so that an oblique view of the foramen as noted above was visualized. The target area is just inferior to the "nose of the scotty dog" or sub pedicular. The soft tissues overlying this structure were infiltrated with 2-3 ml. of 1% Lidocaine without Epinephrine.  The spinal needle was inserted toward the target using a "trajectory" view along the fluoroscope beam.  Under AP and lateral visualization, the needle was advanced so it did not puncture dura and was located close the 6 O'Clock position of the pedical in AP tracterory. Biplanar  projections were used to confirm position. Aspiration was confirmed to be negative for CSF and/or blood. A 1-2 ml. volume of Isovue-250 was injected and flow of contrast was noted at each level. Radiographs were obtained for documentation purposes.   After attaining the desired flow of contrast documented above, a 0.5 to 1.0 ml test dose of 0.25% Marcaine was injected into each respective transforaminal space.   The patient was observed for 90 seconds post injection.  After no sensory deficits were reported, and normal lower extremity motor function was noted,   the above injectate was administered so that equal amounts of the injectate were placed at each foramen (level) into the transforaminal epidural space.   Additional Comments:  The patient tolerated the procedure well Dressing: Band-Aid    Post-procedure details: Patient was observed during the procedure. Post-procedure instructions were reviewed.  Patient left the clinic in stable condition.    Clinical History: MRI LUMBAR SPINE WITHOUT CONTRAST  TECHNIQUE: Multiplanar, multisequence MR imaging of the lumbar spine was performed. No intravenous contrast was administered.  COMPARISON:  None.  FINDINGS: Segmentation:  Standard.  Alignment:  Physiologic.  Vertebrae:  No fracture, evidence of discitis, or bone lesion.  Conus medullaris and cauda equina: Conus extends to the T12 level. Conus and cauda equina appear normal.  Paraspinal and other soft tissues: No acute paraspinal abnormality. Partially visualize is a 4.5 cm T2 hyperintense left upper pole renal mass most consistent with a cyst.  Disc levels:  Disc spaces: Degenerative disc disease with severe disc height loss and reactive endplate changes at P7-1. Milder degenerative disc disease with disc height loss at L3-4.  T12-L1: No significant disc bulge. No evidence of neural foraminal stenosis. No central canal stenosis.  L1-L2: No significant disc bulge. No evidence of neural foraminal stenosis. No central canal stenosis.  L2-L3: No significant disc bulge. No evidence of neural foraminal stenosis. No central canal stenosis.  L3-L4: Broad-based disc bulge eccentric towards the left. Moderate bilateral facet arthropathy with ligamentum flavum infolding resulting in mild spinal stenosis. Bilateral lateral recess stenosis, left greater than right. No  evidence of neural foraminal stenosis.  L4-L5: Broad-based disc bulge with a central subligamentous disc extrusion with cephalad migration of disc material. Moderate bilateral facet arthropathy. Bilateral lateral recess narrowing, left greater than right. Moderate right foraminal stenosis. Mild left foraminal stenosis.  L5-S1: No significant disc bulge. No evidence of neural foraminal stenosis. No central canal stenosis. Moderate right and mild left facet arthropathy.  IMPRESSION: 1. At L3-4 there is a broad-based disc bulge eccentric towards the left. Moderate bilateral facet arthropathy with ligamentum flavum infolding resulting in mild spinal stenosis. Bilateral lateral recess stenosis, left greater than right. 2. At L4-5 there is a broad-based disc bulge with a central subligamentous disc extrusion with cephalad migration of disc material. Moderate bilateral facet arthropathy. Bilateral lateral recess narrowing, left greater than right. Moderate right foraminal stenosis. Mild left foraminal stenosis.   Electronically Signed   By: Kathreen Devoid   On: 08/05/2017 12:32   She reports that she has never smoked. She has never used smokeless tobacco. No results for input(s): HGBA1C, LABURIC in the last 8760 hours.  Objective:  VS:  HT:    WT:   BMI:     BP:(!) 162/72  HR:64bpm  TEMP:98 F (36.7 C)(Oral)  RESP:97 % Physical Exam  Ortho Exam Imaging: No results found.  Past Medical/Family/Surgical/Social History: Medications & Allergies reviewed per EMR, new medications updated. Patient Active Problem List  Diagnosis Date Noted  . Rheumatoid arthritis involving multiple sites with positive rheumatoid factor ([place]) 07/14/2016  . High risk medication use 07/14/2016  . Sjogren's syndrome with keratoconjunctivitis sicca ([place]) 07/14/2016  . Raynaud's disease without gangrene 07/14/2016  . Achilles tendinitis of both lower extremities 07/14/2016  . Fibromyalgia  07/14/2016  . Anxiety and depression 07/14/2016  . History of asthma 07/14/2016  . Environmental allergies 07/14/2016  . Gastroesophageal reflux disease without esophagitis 07/14/2016  . Neuropathy 07/14/2016  . Osteopenia of multiple sites 07/14/2016  . Pain in both hands 07/14/2016  . Pain in both feet 07/14/2016   History reviewed. No pertinent past medical history. Family History  Problem Relation Age of Onset  . Heart failure Mother   . Heart failure Father   . Healthy Daughter   . Healthy Daughter   . Healthy Son    Past Surgical History:  Procedure Laterality Date  . ABDOMINAL HYSTERECTOMY    . BLADDER SURGERY     Social History   Occupational History  . Not on file  Tobacco Use  . Smoking status: Never Smoker  . Smokeless tobacco: Never Used  Substance and Sexual Activity  . Alcohol use: No  . Drug use: No  . Sexual activity: Not on file

## 2017-10-27 NOTE — Procedures (Signed)
Lumbosacral Transforaminal Epidural Steroid Injection - Sub-Pedicular Approach with Fluoroscopic Guidance  Patient: Brenda Hanna      Date of Birth: 1941/03/04 MRN: [id number] PCP: Myrlene Broker, MD      Visit Date: 10/18/2017   Universal Protocol:    Date/Time: 10/18/2017  Consent Given By: the patient  Position: PRONE  Additional Comments: Vital signs were monitored before and after the procedure. Patient was prepped and draped in the usual sterile fashion. The correct patient, procedure, and site was verified.   Injection Procedure Details:  Procedure Site One Meds Administered:  Meds ordered this encounter  Medications  . betamethasone acetate-betamethasone sodium phosphate (CELESTONE) injection 12 mg    Laterality: Bilateral  Location/Site:  L4-L5  Needle size: 22 G  Needle type: Spinal  Needle Placement: Transforaminal  Findings:    -Comments: Excellent flow of contrast along the nerve and into the epidural space.  Procedure Details: After squaring off the end-plates to get a true AP view, the C-arm was positioned so that an oblique view of the foramen as noted above was visualized. The target area is just inferior to the "nose of the scotty dog" or sub pedicular. The soft tissues overlying this structure were infiltrated with 2-3 ml. of 1% Lidocaine without Epinephrine.  The spinal needle was inserted toward the target using a "trajectory" view along the fluoroscope beam.  Under AP and lateral visualization, the needle was advanced so it did not puncture dura and was located close the 6 O'Clock position of the pedical in AP tracterory. Biplanar projections were used to confirm position. Aspiration was confirmed to be negative for CSF and/or blood. A 1-2 ml. volume of Isovue-250 was injected and flow of contrast was noted at each level. Radiographs were obtained for documentation purposes.   After attaining the desired flow of contrast documented above, a  0.5 to 1.0 ml test dose of 0.25% Marcaine was injected into each respective transforaminal space.  The patient was observed for 90 seconds post injection.  After no sensory deficits were reported, and normal lower extremity motor function was noted,   the above injectate was administered so that equal amounts of the injectate were placed at each foramen (level) into the transforaminal epidural space.   Additional Comments:  The patient tolerated the procedure well Dressing: Band-Aid    Post-procedure details: Patient was observed during the procedure. Post-procedure instructions were reviewed.  Patient left the clinic in stable condition.

## 2017-10-30 ENCOUNTER — Ambulatory Visit (INDEPENDENT_AMBULATORY_CARE_PROVIDER_SITE_OTHER): Payer: PPO | Admitting: Specialist

## 2017-10-30 ENCOUNTER — Encounter (INDEPENDENT_AMBULATORY_CARE_PROVIDER_SITE_OTHER): Payer: Self-pay | Admitting: Specialist

## 2017-10-30 VITALS — BP 154/77 | HR 66 | Ht 61.5 in | Wt 135.0 lb

## 2017-10-30 DIAGNOSIS — M48062 Spinal stenosis, lumbar region with neurogenic claudication: Secondary | ICD-10-CM

## 2017-10-30 NOTE — Progress Notes (Signed)
Office Visit Note   Patient: Brenda Hanna           Date of Birth: Jul 14, 1940           MRN: [id number] Visit Date: 10/30/2017              Requested by: Myrlene Broker, MD [place], [place] [id number] PCP: Myrlene Broker, MD   Assessment & Plan: Visit Diagnoses:  1. Spinal stenosis of lumbar region with neurogenic claudication     Plan: Avoid bending, stooping and avoid lifting weights greater than 10 lbs. Avoid prolong standing and walking. Avoid frequent bending and stooping  No lifting greater than 10 lbs. May use ice or moist heat for pain. Weight loss is of benefit. Handicap license is approved. Call us if you would like to have a further ESI and we will arrange for epidural steroid injection.   Follow-Up Instructions: Return in about 1 month (around 11/27/2017).   Orders:  No orders of the defined types were placed in this encounter.  No orders of the defined types were placed in this encounter.     Procedures: No procedures performed   Clinical Data: No additional findings.   Subjective: Chief Complaint  Patient presents with  . Lower Back - Follow-up    77 year old female with lumbar disc herniation she is status post bilateral L4-5 TF ESIs for spinal stenosis. She can walk for 20-30 minutes at a time. Sitting is worse than walking. She has severe knee and foot pain left leg. It switched sides from the right side to the left now. No falls but she feels weak. There is a tightening and she feels like the legs would go into cramps.    Review of Systems  Constitutional: Negative.   HENT: Negative.   Eyes: Negative.   Respiratory: Negative.   Cardiovascular: Negative.   Gastrointestinal: Negative.   Endocrine: Negative.   Genitourinary: Negative.   Musculoskeletal: Negative.   Skin: Negative.   Allergic/Immunologic: Negative.   Neurological: Negative.   Hematological: Negative.   Psychiatric/Behavioral: Negative.       Objective: Vital Signs: BP (!) 154/77 (BP Location: Left Arm, Patient Position: Sitting)   Pulse 66   Ht 5' 1.5" (1.562 m)   Wt 135 lb (61.2 kg)   BMI 25.09 kg/m   Physical Exam  Constitutional: She is oriented to person, place, and time. She appears well-developed and well-nourished.  HENT:  Head: Normocephalic and atraumatic.  Eyes: Pupils are equal, round, and reactive to light. EOM are normal.  Neck: Normal range of motion. Neck supple.  Pulmonary/Chest: Effort normal and breath sounds normal.  Abdominal: Soft. Bowel sounds are normal.  Neurological: She is alert and oriented to person, place, and time.  Skin: Skin is warm and dry.  Psychiatric: She has a normal mood and affect. Her behavior is normal. Judgment and thought content normal.    Back Exam   Tenderness  The patient is experiencing tenderness in the lumbar.  Range of Motion  Extension: abnormal  Flexion: normal  Lateral bend right: normal  Lateral bend left: normal  Rotation right: normal  Rotation left: normal   Muscle Strength  Right Quadriceps:  5/5  Left Quadriceps:  5/5  Right Hamstrings:  5/5  Left Hamstrings:  5/5   Tests  Straight leg raise right: negative Straight leg raise left: negative  Reflexes  Babinski's sign: normal   Other  Toe walk: normal  Heel walk: normal Sensation: normal Gait: normal       Specialty Comments:  No specialty comments available.  Imaging: No results found.   PMFS History: Patient Active Problem List   Diagnosis Date Noted  . Rheumatoid arthritis involving multiple sites with positive rheumatoid factor ([place]) 07/14/2016  . High risk medication use 07/14/2016  . Sjogren's syndrome with keratoconjunctivitis sicca ([place]) 07/14/2016  . Raynaud's disease without gangrene 07/14/2016  . Achilles tendinitis of both lower extremities 07/14/2016  . Fibromyalgia 07/14/2016  . Anxiety and depression 07/14/2016  . History of asthma 07/14/2016  .  Environmental allergies 07/14/2016  . Gastroesophageal reflux disease without esophagitis 07/14/2016  . Neuropathy 07/14/2016  . Osteopenia of multiple sites 07/14/2016  . Pain in both hands 07/14/2016  . Pain in both feet 07/14/2016   History reviewed. No pertinent past medical history.  Family History  Problem Relation Age of Onset  . Heart failure Mother   . Heart failure Father   . Healthy Daughter   . Healthy Daughter   . Healthy Son     Past Surgical History:  Procedure Laterality Date  . ABDOMINAL HYSTERECTOMY    . BLADDER SURGERY     Social History   Occupational History  . Not on file  Tobacco Use  . Smoking status: Never Smoker  . Smokeless tobacco: Never Used  Substance and Sexual Activity  . Alcohol use: No  . Drug use: No  . Sexual activity: Not on file

## 2017-10-30 NOTE — Patient Instructions (Signed)
Avoid bending, stooping and avoid lifting weights greater than 10 lbs. Avoid prolong standing and walking. Avoid frequent bending and stooping  No lifting greater than 10 lbs. May use ice or moist heat for pain. Weight loss is of benefit. Handicap license is approved. Call us if you would like to have a further ESI and we will arrange for epidural steroid injection.

## 2017-11-08 ENCOUNTER — Other Ambulatory Visit (INDEPENDENT_AMBULATORY_CARE_PROVIDER_SITE_OTHER): Payer: Self-pay | Admitting: Specialist

## 2017-11-08 NOTE — Telephone Encounter (Signed)
I called rx to [facility]

## 2017-11-08 NOTE — Telephone Encounter (Signed)
Tramadol refill request 

## 2017-12-04 ENCOUNTER — Other Ambulatory Visit (INDEPENDENT_AMBULATORY_CARE_PROVIDER_SITE_OTHER): Payer: Self-pay | Admitting: Specialist

## 2017-12-04 MED ORDER — TRAMADOL HCL 50 MG PO TABS: 50.0000 mg | ORAL_TABLET | Freq: Four times a day (QID) | ORAL | 0 refills | Status: DC | PRN

## 2017-12-04 NOTE — Telephone Encounter (Signed)
I called rx to [facility], I called and advised that her rx was called in

## 2017-12-04 NOTE — Telephone Encounter (Signed)
Patient called asking for a refill on her tramadol. CB # [phone]

## 2017-12-04 NOTE — Telephone Encounter (Signed)
Sent to Dr. Nitka ° ° °

## 2017-12-04 NOTE — Addendum Note (Signed)
Addended by: Minda Ditto, Geoffery Spruce on: 12/04/2017 08:20 AM   Modules accepted: Orders

## 2017-12-28 ENCOUNTER — Ambulatory Visit: Payer: PPO | Admitting: Rheumatology

## 2017-12-28 ENCOUNTER — Other Ambulatory Visit: Payer: Self-pay | Admitting: Physician Assistant

## 2017-12-28 ENCOUNTER — Encounter: Payer: Self-pay | Admitting: Rheumatology

## 2017-12-28 ENCOUNTER — Other Ambulatory Visit (INDEPENDENT_AMBULATORY_CARE_PROVIDER_SITE_OTHER): Payer: Self-pay | Admitting: Specialist

## 2017-12-28 VITALS — BP 164/66 | HR 70 | Ht 61.0 in | Wt 134.0 lb

## 2017-12-28 DIAGNOSIS — G8929 Other chronic pain: Secondary | ICD-10-CM

## 2017-12-28 DIAGNOSIS — M19041 Primary osteoarthritis, right hand: Secondary | ICD-10-CM

## 2017-12-28 DIAGNOSIS — M3501 Sicca syndrome with keratoconjunctivitis: Secondary | ICD-10-CM | POA: Diagnosis not present

## 2017-12-28 DIAGNOSIS — Z8719 Personal history of other diseases of the digestive system: Secondary | ICD-10-CM | POA: Diagnosis not present

## 2017-12-28 DIAGNOSIS — M0579 Rheumatoid arthritis with rheumatoid factor of multiple sites without organ or systems involvement: Secondary | ICD-10-CM

## 2017-12-28 DIAGNOSIS — Z8659 Personal history of other mental and behavioral disorders: Secondary | ICD-10-CM | POA: Diagnosis not present

## 2017-12-28 DIAGNOSIS — Z8709 Personal history of other diseases of the respiratory system: Secondary | ICD-10-CM

## 2017-12-28 DIAGNOSIS — M533 Sacrococcygeal disorders, not elsewhere classified: Secondary | ICD-10-CM

## 2017-12-28 DIAGNOSIS — M8589 Other specified disorders of bone density and structure, multiple sites: Secondary | ICD-10-CM

## 2017-12-28 DIAGNOSIS — M797 Fibromyalgia: Secondary | ICD-10-CM | POA: Diagnosis not present

## 2017-12-28 DIAGNOSIS — I7300 Raynaud's syndrome without gangrene: Secondary | ICD-10-CM | POA: Diagnosis not present

## 2017-12-28 DIAGNOSIS — Z79899 Other long term (current) drug therapy: Secondary | ICD-10-CM

## 2017-12-28 DIAGNOSIS — Z9109 Other allergy status, other than to drugs and biological substances: Secondary | ICD-10-CM

## 2017-12-28 DIAGNOSIS — M19042 Primary osteoarthritis, left hand: Secondary | ICD-10-CM

## 2017-12-28 DIAGNOSIS — Z8669 Personal history of other diseases of the nervous system and sense organs: Secondary | ICD-10-CM

## 2017-12-28 DIAGNOSIS — M5136 Other intervertebral disc degeneration, lumbar region: Secondary | ICD-10-CM | POA: Diagnosis not present

## 2017-12-28 DIAGNOSIS — M51369 Other intervertebral disc degeneration, lumbar region without mention of lumbar back pain or lower extremity pain: Secondary | ICD-10-CM

## 2017-12-28 LAB — CBC WITH DIFFERENTIAL/PLATELET
BASOS PCT: 0.9 %
Basophils Absolute: 68 cells/uL (ref 0–200)
EOS PCT: 5.5 %
Eosinophils Absolute: 418 cells/uL (ref 15–500)
HCT: 33.8 % — ABNORMAL LOW (ref 35.0–45.0)
HEMOGLOBIN: 11.4 g/dL — AB (ref 11.7–15.5)
LYMPHS ABS: 2151 {cells}/uL (ref 850–3900)
MCH: 32.4 pg (ref 27.0–33.0)
MCHC: 33.7 g/dL (ref 32.0–36.0)
MCV: 96 fL (ref 80.0–100.0)
MPV: 10.3 fL (ref 7.5–12.5)
Monocytes Relative: 8.6 %
NEUTROS ABS: 4309 {cells}/uL (ref 1500–7800)
Neutrophils Relative %: 56.7 %
Platelets: 328 10*3/uL (ref 140–400)
RBC: 3.52 10*6/uL — AB (ref 3.80–5.10)
RDW: 12.5 % (ref 11.0–15.0)
Total Lymphocyte: 28.3 %
WBC mixed population: 654 cells/uL (ref 200–950)
WBC: 7.6 10*3/uL (ref 3.8–10.8)

## 2017-12-28 LAB — COMPLETE METABOLIC PANEL WITH GFR
AG Ratio: 1.6 (calc) (ref 1.0–2.5)
ALBUMIN MSPROF: 4.1 g/dL (ref 3.6–5.1)
ALKALINE PHOSPHATASE (APISO): 95 U/L (ref 33–130)
ALT: 14 U/L (ref 6–29)
AST: 24 U/L (ref 10–35)
BUN/Creatinine Ratio: 12 (calc) (ref 6–22)
BUN: 14 mg/dL (ref 7–25)
CALCIUM: 9.3 mg/dL (ref 8.6–10.4)
CO2: 26 mmol/L (ref 20–32)
CREATININE: 1.18 mg/dL — AB (ref 0.60–0.93)
Chloride: 101 mmol/L (ref 98–110)
GFR, EST AFRICAN AMERICAN: 53 mL/min/{1.73_m2} — AB (ref >=60)
GFR, EST NON AFRICAN AMERICAN: 45 mL/min/{1.73_m2} — AB (ref >=60)
GLUCOSE: 93 mg/dL (ref 65–99)
Globulin: 2.6 g/dL (calc) (ref 1.9–3.7)
Potassium: 4.7 mmol/L (ref 3.5–5.3)
Sodium: 136 mmol/L (ref 135–146)
TOTAL PROTEIN: 6.7 g/dL (ref 6.1–8.1)
Total Bilirubin: 0.3 mg/dL (ref 0.2–1.2)

## 2017-12-28 MED ORDER — TRAMADOL HCL 50 MG PO TABS: 50.0000 mg | ORAL_TABLET | Freq: Four times a day (QID) | ORAL | 0 refills | Status: DC | PRN

## 2017-12-28 NOTE — Telephone Encounter (Signed)
CBC and CMP are being drawn today.   She was given a PLQ eye exam form today, and she was advised to schedule this visit ASAP.

## 2017-12-28 NOTE — Telephone Encounter (Signed)
rx called to [facility],  I called and LMOM to let pt know

## 2017-12-28 NOTE — Telephone Encounter (Signed)
Sent to Dr. Nitka ° ° °

## 2017-12-28 NOTE — Progress Notes (Signed)
Office Visit Note  Patient: Brenda Hanna             Date of Birth: 20-Jun-1941           MRN: [id number]             PCP: Myrlene Broker, MD Referring: Myrlene Broker, MD Visit Date: 12/28/2017 Occupation: @GUAROCC @    Subjective:  Lower back pain.  History of Present Illness: Brenda Hanna is a 77 y.o. female with history of seropositive rheumatoid arthritis, Sjogren's, Raynaud's, osteoarthritis, fibromyalgia, and DDD.  Patient has been taking Plaquenil 200 mg twice daily Monday through Friday.  She states she continues to have some joint stiffness.  Continues to have sicca symptoms.  She continues to have some Raynauds.  She also has generalized pain from fibromyalgia.  She is going through lower back pain currently and had been followed by Dr. Louanne Skye.  She states the most recent MRI showed some disc rupture and she is taking tramadol for pain relief.  Activities of Daily Living:  Patient reports morning stiffness for 5-10 minutes.   Patient Reports nocturnal pain.  Difficulty dressing/grooming: Denies Difficulty climbing stairs: Reports Difficulty getting out of chair: Reports Difficulty using hands for taps, buttons, cutlery, and/or writing: Denies   Review of Systems  Constitutional: Positive for fatigue. Negative for fever, night sweats, weight gain and weight loss.  HENT: Negative for ear pain, mouth sores, trouble swallowing, trouble swallowing, mouth dryness and nose dryness.   Eyes: Negative for pain, redness, visual disturbance and dryness.  Respiratory: Negative for cough, shortness of breath and difficulty breathing.   Cardiovascular: Negative for chest pain, palpitations, hypertension, irregular heartbeat and swelling in legs/feet.  Gastrointestinal: Positive for constipation. Negative for blood in stool and diarrhea.  Endocrine: Negative for increased urination.  Genitourinary: Negative for difficulty urinating and vaginal dryness.  Musculoskeletal:  Positive for arthralgias, joint pain, myalgias, muscle weakness, morning stiffness and myalgias. Negative for joint swelling and muscle tenderness.  Skin: Positive for hair loss. Negative for color change, rash, skin tightness, ulcers and sensitivity to sunlight.  Allergic/Immunologic: Negative for susceptible to infections.  Neurological: Negative for dizziness, numbness, memory loss, night sweats and weakness.  Hematological: Negative for bruising/bleeding tendency and swollen glands.  Psychiatric/Behavioral: Positive for sleep disturbance. Negative for depressed mood. The patient is not nervous/anxious.     PMFS History:  Patient Active Problem List   Diagnosis Date Noted  . Rheumatoid arthritis involving multiple sites with positive rheumatoid factor ([place]) 07/14/2016  . High risk medication use 07/14/2016  . Sjogren's syndrome with keratoconjunctivitis sicca ([place]) 07/14/2016  . Raynaud's disease without gangrene 07/14/2016  . Achilles tendinitis of both lower extremities 07/14/2016  . Fibromyalgia 07/14/2016  . Anxiety and depression 07/14/2016  . History of asthma 07/14/2016  . Environmental allergies 07/14/2016  . Gastroesophageal reflux disease without esophagitis 07/14/2016  . Neuropathy 07/14/2016  . Osteopenia of multiple sites 07/14/2016  . Pain in both hands 07/14/2016  . Pain in both feet 07/14/2016    Past Medical History:  Diagnosis Date  . Back pain     Family History  Problem Relation Age of Onset  . Heart failure Mother   . Heart failure Father   . Healthy Daughter   . Healthy Daughter   . Healthy Son    Past Surgical History:  Procedure Laterality Date  . ABDOMINAL HYSTERECTOMY    . BLADDER SURGERY     Social History   Social History Narrative  .  Not on file     Objective: Vital Signs: BP (!) 164/66 (BP Location: Left Arm, Patient Position: Sitting, Cuff Size: Normal)   Pulse 70   Ht 5\' 1"  (1.549 m)   Wt 134 lb (60.8 kg)   BMI 25.32 kg/m     Physical Exam  Constitutional: She is oriented to person, place, and time. She appears well-developed and well-nourished.  HENT:  Head: Normocephalic and atraumatic.  Eyes: Conjunctivae and EOM are normal.  Neck: Normal range of motion.  Cardiovascular: Normal rate, regular rhythm, normal heart sounds and intact distal pulses.  Pulmonary/Chest: Effort normal and breath sounds normal.  Abdominal: Soft. Bowel sounds are normal.  Lymphadenopathy:    She has no cervical adenopathy.  Neurological: She is alert and oriented to person, place, and time.  Skin: Skin is warm and dry. Capillary refill takes less than 2 seconds.  Psychiatric: She has a normal mood and affect. Her behavior is normal.  Nursing note and vitals reviewed.    Musculoskeletal Exam: She has limited painful range of motion of her lumbar spine.  And wrist joints were in good range of motion.  She has some DIP and PIP thickening but no synovitis was noted.  Some swelling or effusion.  Generalized hyperalgesia and positive tender points.  CDAI Exam: CDAI Homunculus Exam:   Joint Counts:  CDAI Tender Joint count: 0 CDAI Swollen Joint count: 0  Global Assessments:  Patient Global Assessment: 6 Provider Global Assessment: 1  CDAI Calculated Score: 7    Investigation: No additional findings.PLQ eye exam: 08/10/2016 CBC Latest Ref Rng & Units 07/07/2017 02/22/2017 07/14/2016  WBC 3.8 - 10.8 Thousand/uL 7.3 7.1 6.3  Hemoglobin 11.7 - 15.5 g/dL 12.1 11.7 11.9  Hematocrit 35.0 - 45.0 % 35.4 35.5 36.9  Platelets 140 - 400 Thousand/uL 299 322 316   CMP Latest Ref Rng & Units 07/07/2017 02/22/2017 07/14/2016  Glucose 65 - 99 mg/dL 97 93 93  BUN 7 - 25 mg/dL 16 11 13   Creatinine 0.60 - 0.93 mg/dL 1.07(H) 0.95(H) 1.04(H)  Sodium 135 - 146 mmol/L 138 135 140  Potassium 3.5 - 5.3 mmol/L 4.9 5.5(H) 4.9  Chloride 98 - 110 mmol/L 101 98 102  CO2 20 - 32 mmol/L 30 27 26   Calcium 8.6 - 10.4 mg/dL 9.5 9.6 9.5  Total Protein 6.1 -  8.1 g/dL 6.8 6.7 7.0  Total Bilirubin 0.2 - 1.2 mg/dL 0.4 0.3 0.4  Alkaline Phos 33 - 130 U/L - 103 89  AST 10 - 35 U/L 25 25 25   ALT 6 - 29 U/L 14 13 15     Imaging: No results found.  Speciality Comments: No specialty comments available.    Procedures:  No procedures performed Allergies: Contrast media [iodinated diagnostic agents]; Penicillins; and Shellfish allergy   Assessment / Plan:     Visit Diagnoses: Rheumatoid arthritis involving multiple sites with positive rheumatoid factor (HCC) - Positive RF, negative CCP.  Her rheumatoid arthritis seems to be quite well controlled on Plaquenil.  She had no synovitis on examination today.  She continues to have some arthralgias and myalgias which appear to be related to fibromyalgia.  High risk medication use - PLQ200 mg p.o. twice daily Monday through Friday eye exam: 08/10/2016 - Plan: CBC with Differential/Platelet, COMPLETE METABOLIC PANEL WITH GFR today and then every 5 months with the visits.  She has not had eye exam in 1 year.  Reminded to have eye exam and a form was given.  Sjogren's syndrome  with keratoconjunctivitis sicca (HCC) - Positive ANA, positive Ro.  She has been using over-the-counter products for sicca symptoms.  Raynaud's disease without gangrene-some erythema on her hands but no cyanosis was noted.  Primary osteoarthritis of both hands-she has DIP and PIP thickening.  Joint protection muscle strengthening was discussed.  Fibromyalgia-she continues to have some generalized pain from fibromyalgia.  DDD (degenerative disc disease), lumbar-she has severe pain and discomfort in the lumbar spine.  She has been followed by Dr. Louanne Skye and is currently on tramadol.  Osteopenia of multiple sites - December 2012 T score -2.1. Patient states her bone densities are done by her PCP  Other medical problems are listed as follows:  History of neuropathy  History of gastroesophageal reflux (GERD)  History of anxiety - She  takes Ativan 1 mg PRN.   History of depression  History of asthma  Environmental allergies    Orders: Orders Placed This Encounter  Procedures  . CBC with Differential/Platelet  . COMPLETE METABOLIC PANEL WITH GFR   No orders of the defined types were placed in this encounter.   Face-to-face time spent with patient was 30 minutes. Greater than 50% of time was spent in counseling and coordination of care.  Follow-Up Instructions: Return in about 5 months (around 05/30/2018) for Rheumatoid arthritis, Osteoarthritis, Fibromyalgia, Sjogren's syndrome, Raynaud's syndrome.   Bo Merino, MD  Note - This record has been created using Editor, commissioning.  Chart creation errors have been sought, but may not always  have been located. Such creation errors do not reflect on  the standard of medical care.

## 2017-12-28 NOTE — Telephone Encounter (Signed)
Rx refill Tramadol [facility]/[address]

## 2017-12-28 NOTE — Telephone Encounter (Signed)
Last Visit: 07/07/17 Next Visit due in June 2019. Labs 07/07/17 Creatinine elevated and continues to trend up. GFR low. PLQ eye exam: 08/10/2016

## 2017-12-29 NOTE — Progress Notes (Signed)
Creatinine is elevated.  Please notify patient.  She should not be taking any NSAIDs.

## 2018-02-01 ENCOUNTER — Other Ambulatory Visit (INDEPENDENT_AMBULATORY_CARE_PROVIDER_SITE_OTHER): Payer: Self-pay | Admitting: Specialist

## 2018-02-01 NOTE — Telephone Encounter (Signed)
Tramadol refill request 

## 2018-02-02 ENCOUNTER — Other Ambulatory Visit (INDEPENDENT_AMBULATORY_CARE_PROVIDER_SITE_OTHER): Payer: Self-pay | Admitting: Specialist

## 2018-02-02 NOTE — Telephone Encounter (Signed)
Called to [facility] 

## 2018-02-05 DIAGNOSIS — H2513 Age-related nuclear cataract, bilateral: Secondary | ICD-10-CM | POA: Diagnosis not present

## 2018-02-05 DIAGNOSIS — H18459 Nodular corneal degeneration, unspecified eye: Secondary | ICD-10-CM | POA: Diagnosis not present

## 2018-03-12 ENCOUNTER — Other Ambulatory Visit (INDEPENDENT_AMBULATORY_CARE_PROVIDER_SITE_OTHER): Payer: Self-pay | Admitting: Specialist

## 2018-03-12 MED ORDER — TRAMADOL HCL 50 MG PO TABS: 50.0000 mg | ORAL_TABLET | Freq: Four times a day (QID) | ORAL | 0 refills | Status: DC | PRN

## 2018-03-12 NOTE — Telephone Encounter (Signed)
Called rx to [facility]  

## 2018-03-12 NOTE — Telephone Encounter (Signed)
Sent request to dr. nitka  

## 2018-03-12 NOTE — Telephone Encounter (Signed)
Patient called needing Rx refilled (Tramdol 50 mg)  Patient uses the [facility] in [place]   The Ph# is [phone] The number to contact patient is [phone]

## 2018-03-13 NOTE — Telephone Encounter (Signed)
Called to [place] on 03/12/18

## 2018-04-11 ENCOUNTER — Other Ambulatory Visit: Payer: Self-pay | Admitting: Physician Assistant

## 2018-04-11 ENCOUNTER — Other Ambulatory Visit (INDEPENDENT_AMBULATORY_CARE_PROVIDER_SITE_OTHER): Payer: Self-pay | Admitting: Specialist

## 2018-04-11 DIAGNOSIS — Z79899 Other long term (current) drug therapy: Secondary | ICD-10-CM

## 2018-04-11 MED ORDER — TRAMADOL HCL 50 MG PO TABS: 50.0000 mg | ORAL_TABLET | Freq: Four times a day (QID) | ORAL | 0 refills | Status: DC | PRN

## 2018-04-11 NOTE — Telephone Encounter (Signed)
She needs eye exam to monitor for for Plaquenil toxicity.  You may contact the ophthalmology office if she has an eye exam then she may get the Plaquenil refill.

## 2018-04-11 NOTE — Telephone Encounter (Signed)
Last visit: 12/28/17 Next Visit  05/17/18 Labs: 12/28/17 Creatinine is elevated.  PLQ Eye Exam: 08/10/16 WNL   Patient states she has an appointment with a specialist Dr. Leeroy Bock at [facility] for a nodule that needs to be removed. Patient states that she will have to have that done before she can have her cataracts removed. Patient states she is going to see if Dr. Leeroy Bock will do it for her   Okay to refill 30 day supply PLQ?

## 2018-04-11 NOTE — Telephone Encounter (Signed)
Per Dr. Estanislado Pandy okay to refill as patient has appointment scheduled with Dr. In January and seeing Dr.French 05/04/18

## 2018-04-11 NOTE — Telephone Encounter (Signed)
[place]  [phone]   Tramadol

## 2018-04-11 NOTE — Telephone Encounter (Signed)
Sent request to Dr. Nitka 

## 2018-04-12 NOTE — Telephone Encounter (Signed)
Called rx to [facility]  

## 2018-04-18 DIAGNOSIS — J309 Allergic rhinitis, unspecified: Secondary | ICD-10-CM | POA: Diagnosis not present

## 2018-04-18 DIAGNOSIS — E039 Hypothyroidism, unspecified: Secondary | ICD-10-CM | POA: Diagnosis not present

## 2018-05-02 DIAGNOSIS — Z23 Encounter for immunization: Secondary | ICD-10-CM | POA: Diagnosis not present

## 2018-05-03 NOTE — Progress Notes (Deleted)
Office Visit Note  Patient: Brenda Hanna             Date of Birth: Aug 26, 1940           MRN: [id number]             PCP: Myrlene Broker, MD Referring: Myrlene Broker, MD Visit Date: 05/17/2018 Occupation: @GUAROCC @  Subjective:  No chief complaint on file.  Current regimen includes Plaquenil 200 mg in the morning and 100 mg at bedtime.  Last PLQ eye exam normal on 08/10/16.  Most recent CBC/CMP stable except decline in kidney function on 12/28/17.  She was advised to avoid NSAID's. CBC/CMP ordered for today and then every 5 months to monitor for drug toxicity. Recommend flu, Pneumovax 23, Prevnar 13, and Shingrix as indicated.  Last x-ray of sacrum, pelvis, lumbar spine, and bilateral knee/feet on 07/07/17.   History of Present Illness: Brenda Hanna is a 77 y.o. female with history of seropositive rheumatoid arthritis, Sjogren's, Raynaud's, osteoarthritis, fibromyalgia, and DDD.  Activities of Daily Living:  Patient reports morning stiffness for *** {minute/hour:19697}.   Patient {ACTIONS;DENIES/REPORTS:21021675::"Denies"} nocturnal pain.  Difficulty dressing/grooming: {ACTIONS;DENIES/REPORTS:21021675::"Denies"} Difficulty climbing stairs: {ACTIONS;DENIES/REPORTS:21021675::"Denies"} Difficulty getting out of chair: {ACTIONS;DENIES/REPORTS:21021675::"Denies"} Difficulty using hands for taps, buttons, cutlery, and/or writing: {ACTIONS;DENIES/REPORTS:21021675::"Denies"}  No Rheumatology ROS completed.   PMFS History:  Patient Active Problem List   Diagnosis Date Noted  . Rheumatoid arthritis involving multiple sites with positive rheumatoid factor ([place]) 07/14/2016  . High risk medication use 07/14/2016  . Sjogren's syndrome with keratoconjunctivitis sicca ([place]) 07/14/2016  . Raynaud's disease without gangrene 07/14/2016  . Achilles tendinitis of both lower extremities 07/14/2016  . Fibromyalgia 07/14/2016  . Anxiety and depression 07/14/2016  . History of asthma  07/14/2016  . Environmental allergies 07/14/2016  . Gastroesophageal reflux disease without esophagitis 07/14/2016  . Neuropathy 07/14/2016  . Osteopenia of multiple sites 07/14/2016  . Pain in both hands 07/14/2016  . Pain in both feet 07/14/2016    Past Medical History:  Diagnosis Date  . Back pain     Family History  Problem Relation Age of Onset  . Heart failure Mother   . Heart failure Father   . Healthy Daughter   . Healthy Daughter   . Healthy Son    Past Surgical History:  Procedure Laterality Date  . ABDOMINAL HYSTERECTOMY    . BLADDER SURGERY     Social History   Social History Narrative  . Not on file    Objective: Vital Signs: There were no vitals taken for this visit.   Physical Exam   Musculoskeletal Exam: ***  CDAI Exam: CDAI Score: Not documented Patient Global Assessment: Not documented; Provider Global Assessment: Not documented Swollen: Not documented; Tender: Not documented Joint Exam   Not documented   There is currently no information documented on the homunculus. Go to the Rheumatology activity and complete the homunculus joint exam.  Investigation: No additional findings.  Imaging: No results found.  Recent Labs: Lab Results  Component Value Date   WBC 7.6 12/28/2017   HGB 11.4 (L) 12/28/2017   PLT 328 12/28/2017   NA 136 12/28/2017   K 4.7 12/28/2017   CL 101 12/28/2017   CO2 26 12/28/2017   GLUCOSE 93 12/28/2017   BUN 14 12/28/2017   CREATININE 1.18 (H) 12/28/2017   BILITOT 0.3 12/28/2017   ALKPHOS 103 02/22/2017   AST 24 12/28/2017   ALT 14 12/28/2017   PROT 6.7 12/28/2017   ALBUMIN 4.1  02/22/2017   CALCIUM 9.3 12/28/2017   GFRAA 53 (L) 12/28/2017    Speciality Comments: No specialty comments available.  Procedures:  No procedures performed Allergies: Contrast media [iodinated diagnostic agents]; Penicillins; and Shellfish allergy   Assessment / Plan:     Visit Diagnoses: Rheumatoid arthritis involving  multiple sites with positive rheumatoid factor (HCC) - Positive RF, negative CCP:    High risk medication use - Plaquenil 200 mg a.m., 100 mg p.m. PLQ eye exam: 08/10/2016.  Sjogren's syndrome with keratoconjunctivitis sicca (HCC)  Raynaud's disease without gangrene  Primary osteoarthritis of both hands  Fibromyalgia  DDD (degenerative disc disease), lumbar  Chronic SI joint pain  Osteopenia of multiple sites  History of neuropathy  History of gastroesophageal reflux (GERD)  History of anxiety  History of asthma  Achilles tendinitis of both lower extremities   Orders: No orders of the defined types were placed in this encounter.  No orders of the defined types were placed in this encounter.   Face-to-face time spent with patient was *** minutes. Greater than 50% of time was spent in counseling and coordination of care.  Follow-Up Instructions: No follow-ups on file.   Ofilia Neas, PA-C  Note - This record has been created using Dragon software.  Chart creation errors have been sought, but may not always  have been located. Such creation errors do not reflect on  the standard of medical care.

## 2018-05-04 DIAGNOSIS — H18452 Nodular corneal degeneration, left eye: Secondary | ICD-10-CM | POA: Diagnosis not present

## 2018-05-09 ENCOUNTER — Other Ambulatory Visit (INDEPENDENT_AMBULATORY_CARE_PROVIDER_SITE_OTHER): Payer: Self-pay | Admitting: Specialist

## 2018-05-10 NOTE — Telephone Encounter (Signed)
Tramadol refill request 

## 2018-05-11 NOTE — Telephone Encounter (Signed)
Called to [facility] 

## 2018-05-17 ENCOUNTER — Ambulatory Visit: Payer: PPO | Admitting: Physician Assistant

## 2018-05-17 NOTE — Progress Notes (Signed)
Office Visit Note  Patient: Brenda Hanna             Date of Birth: [birth year]           MRN: [id number]             PCP: Myrlene Broker, MD Referring: Myrlene Broker, MD Visit Date: 05/23/2018 Occupation: @GUAROCC @  Subjective:  Back pain    History of Present Illness: Brenda Hanna is a 77 y.o. female with history of seropositive rheumatoid arthritis, Sjogren's, Raynaud's, osteoarthritis, fibromyalgia, and DDD. she takes PLQ 200 mg 1.5 tablets by mouth daily.  She reports she was recently started on tramadol by Dr. Louanne Skye and has been taking 1 tablet by mouth daily.  She states that the tramadol has reduced her pain significantly.  She reports that she is having less muscle aches and muscle tenderness due to fibromyalgia.  She denies any recent rheumatoid arthritis flares.  She continues to walk for exercise on a daily basis.  She states that she notices intermittent pain and swelling in the left knee joint.  She reports that she has painful neuropathy in bilateral feet.  She continues to have sicca symptoms.  She uses Restasis eyedrops on a daily basis.  She continues to follow-up with her dentist on a regular basis.  She takes Ambien 10 mg at bedtime which helps with insomnia.  Her fatigue has been stable.  Activities of Daily Living:  Patient reports morning stiffness for 45  minutes.   Patient Reports nocturnal pain.  Difficulty dressing/grooming: Denies Difficulty climbing stairs: Denies Difficulty getting out of chair: Reports Difficulty using hands for taps, buttons, cutlery, and/or writing: Denies  Review of Systems  Constitutional: Negative for fatigue.  HENT: Positive for mouth dryness. Negative for mouth sores and nose dryness.   Eyes: Positive for dryness. Negative for pain and visual disturbance.  Respiratory: Negative for cough, hemoptysis, shortness of breath and difficulty breathing.   Cardiovascular: Negative for chest pain, palpitations, hypertension and  swelling in legs/feet.  Gastrointestinal: Negative for blood in stool, constipation and diarrhea.  Endocrine: Negative for increased urination.  Genitourinary: Negative for painful urination.  Musculoskeletal: Positive for arthralgias, joint pain, joint swelling and morning stiffness. Negative for myalgias, muscle weakness, muscle tenderness and myalgias.  Skin: Negative for color change, pallor, rash, hair loss, nodules/bumps, skin tightness, ulcers and sensitivity to sunlight.  Allergic/Immunologic: Negative for susceptible to infections.  Neurological: Negative for dizziness, numbness, headaches and weakness.  Hematological: Negative for swollen glands.  Psychiatric/Behavioral: Positive for sleep disturbance. Negative for depressed mood. The patient is not nervous/anxious.     PMFS History:  Patient Active Problem List   Diagnosis Date Noted  . Rheumatoid arthritis involving multiple sites with positive rheumatoid factor ([place]) 07/14/2016  . High risk medication use 07/14/2016  . Sjogren's syndrome with keratoconjunctivitis sicca ([place]) 07/14/2016  . Raynaud's disease without gangrene 07/14/2016  . Achilles tendinitis of both lower extremities 07/14/2016  . Fibromyalgia 07/14/2016  . Anxiety and depression 07/14/2016  . History of asthma 07/14/2016  . Environmental allergies 07/14/2016  . Gastroesophageal reflux disease without esophagitis 07/14/2016  . Neuropathy 07/14/2016  . Osteopenia of multiple sites 07/14/2016  . Pain in both hands 07/14/2016  . Pain in both feet 07/14/2016    Past Medical History:  Diagnosis Date  . Back pain     Family History  Problem Relation Age of Onset  . Heart failure Mother   . Heart failure Father   .  Healthy Daughter   . Healthy Daughter   . Healthy Son    Past Surgical History:  Procedure Laterality Date  . ABDOMINAL HYSTERECTOMY    . BLADDER SURGERY     Social History   Social History Narrative  . Not on file   Immunization  History  Administered Date(s) Administered  . Influenza, High Dose Seasonal PF 04/06/2017  . Zoster Recombinat (Shingrix) 01/31/2018  ,  Objective: Vital Signs: BP (!) 151/68 (BP Location: Right Arm, Patient Position: Sitting, Cuff Size: Small)   Pulse 70   Resp 14   Ht 5\' 1"  (1.549 m)   Wt 130 lb 6.4 oz (59.1 kg)   BMI 24.64 kg/m    Physical Exam  Constitutional: She is oriented to person, place, and time. She appears well-developed and well-nourished.  HENT:  Head: Normocephalic and atraumatic.  Eyes: Conjunctivae and EOM are normal.  Neck: Normal range of motion.  Cardiovascular: Normal rate, regular rhythm, normal heart sounds and intact distal pulses.  Pulmonary/Chest: Effort normal and breath sounds normal.  Abdominal: Soft. Bowel sounds are normal.  Lymphadenopathy:    She has no cervical adenopathy.  Neurological: She is alert and oriented to person, place, and time.  Skin: Skin is warm and dry. Capillary refill takes less than 2 seconds.  Psychiatric: She has a normal mood and affect. Her behavior is normal.  Nursing note and vitals reviewed.    Musculoskeletal Exam: C-spine limited range of motion.  She is limited range of motion of the lumbar spine.  Shoulder joints, elbow joints, wrist joints, MCPs and PIPs and DIPs good range of motion no synovitis.  She is PIP and DIP synovial thickening consistent with also arthritis of bilateral hands.  She has complete fist formation bilaterally.  Left hip slightly limited range of motion with discomfort.  Right hip full range of motion with no discomfort.  Knee joints good range of motion with no warmth or effusion.  No tenderness or swelling of ankle joints.  No tenderness of trochanteric bursa bilaterally.  CDAI Exam: CDAI Score: 0.6  Patient Global Assessment: 3 (mm); Provider Global Assessment: 3 (mm) Swollen: 0 ; Tender: 0  Joint Exam   Not documented   There is currently no information documented on the homunculus. Go  to the Rheumatology activity and complete the homunculus joint exam.  Investigation: No additional findings.  Imaging: No results found.  Recent Labs: Lab Results  Component Value Date   WBC 7.6 12/28/2017   HGB 11.4 (L) 12/28/2017   PLT 328 12/28/2017   NA 136 12/28/2017   K 4.7 12/28/2017   CL 101 12/28/2017   CO2 26 12/28/2017   GLUCOSE 93 12/28/2017   BUN 14 12/28/2017   CREATININE 1.18 (H) 12/28/2017   BILITOT 0.3 12/28/2017   ALKPHOS 103 02/22/2017   AST 24 12/28/2017   ALT 14 12/28/2017   PROT 6.7 12/28/2017   ALBUMIN 4.1 02/22/2017   CALCIUM 9.3 12/28/2017   GFRAA 53 (L) 12/28/2017    Speciality Comments:  PLQ eye exam normal on 08/10/16 per [facility]   Procedures:  No procedures performed Allergies: Methotrexate; Contrast media [iodinated diagnostic agents]; Penicillins; and Shellfish allergy     Assessment / Plan:     Visit Diagnoses: Rheumatoid arthritis involving multiple sites with positive rheumatoid factor (HCC) - Positive RF, negative CCP: She has no synovitis on exam.  She has no joint pain or joint swelling at this time.  She continues to have morning  stiffness lasting about 45 minutes.  She has not had any recent rheumatoid arthritis layers.  Her arthritis is well controlled on Plaquenil 1 tablet a morning and half tablet in the evening.  A refill of Plaquenil sent to the pharmacy today.  She will continue taking Plaquenil as prescribed.  She was advised to notify us if she develops increased joint pain or joint swelling.  She will follow-up in the office in 5 months.  High risk medication use - Plaquenil 200 mg a.m., 100 mg p.m. PLQ eye exam: 08/10/2016.   CBC and CMP will be drawn today to monitor for drug toxicity.  She has been avoiding NSAIDs.  She was recently started on tramadol by Dr. Louanne Skye which has been controlling her pain better.- Plan: CBC with Differential/Platelet, COMPLETE METABOLIC PANEL WITH GFR, hydroxychloroquine (PLAQUENIL) 200 MG  tablet  Sjogren's syndrome with keratoconjunctivitis sicca ([place]): She continues to have sicca symptoms.  She has no parotid swelling on exam.  She continues to see her dentist on a regular basis.  She has been using Restasis eyedrops on a daily basis for eye dryness.  Raynaud's disease without gangrene: She has intermittent symptoms of Raynaud's.  No digital ulcerations or signs of gangrene were noted.  Primary osteoarthritis of both hands: She has PIP and DIP synovial thickening consistent with osteoarthritis of bilateral hands.  She has complete fist formation bilaterally.  Joint protection and muscle strengthening were discussed.  Fibromyalgia: She continues to have generalized muscle aches muscle tenderness due to fibromyalgia.  Since starting on tramadol she has noticed less myalgias.  She continues to walk for exercise on a daily basis.  She takes Ambien 10 mg by mouth at bedtime to help with insomnia.  Her fatigue has been stable recently.  DDD (degenerative disc disease), lumbar: Chronic pain.  She has limited range of motion with discomfort.  She continues to follow-up with Dr. Louanne Skye.  Dr. Louanne Skye started her on tramadol 50 mg 1 tablet by mouth daily as needed for pain relief.   Chronic SI joint pain: No SI joint tenderness on exam.  Osteopenia of multiple sites: She takes a calcium and vitamin D supplement daily.  Other medical conditions are listed as follows:  History of neuropathy  History of gastroesophageal reflux (GERD)  History of anxiety  History of depression  History of asthma    Orders: Orders Placed This Encounter  Procedures  . CBC with Differential/Platelet  . COMPLETE METABOLIC PANEL WITH GFR   Meds ordered this encounter  Medications  . hydroxychloroquine (PLAQUENIL) 200 MG tablet    Sig: Take 1 tablet (200 mg) by mouth in the morning and 1/2 tablet (100 mg) by mouth in the evening.    Dispense:  135 tablet    Refill:  0    Face-to-face time spent  with patient was 30 minutes. Greater than 50% of time was spent in counseling and coordination of care.  Follow-Up Instructions: Return in about 5 months (around 10/22/2018) for Rheumatoid arthritis, Sjogren's syndrome, Osteoarthritis, Fibromyalgia, DDD.   Ofilia Neas, PA-C  Note - This record has been created using Dragon software.  Chart creation errors have been sought, but may not always  have been located. Such creation errors do not reflect on  the standard of medical care.

## 2018-05-23 ENCOUNTER — Encounter: Payer: Self-pay | Admitting: Physician Assistant

## 2018-05-23 ENCOUNTER — Ambulatory Visit: Payer: PPO | Admitting: Physician Assistant

## 2018-05-23 VITALS — BP 151/68 | HR 70 | Resp 14 | Ht 61.0 in | Wt 130.4 lb

## 2018-05-23 DIAGNOSIS — M533 Sacrococcygeal disorders, not elsewhere classified: Secondary | ICD-10-CM | POA: Diagnosis not present

## 2018-05-23 DIAGNOSIS — M797 Fibromyalgia: Secondary | ICD-10-CM

## 2018-05-23 DIAGNOSIS — Z8669 Personal history of other diseases of the nervous system and sense organs: Secondary | ICD-10-CM

## 2018-05-23 DIAGNOSIS — M8589 Other specified disorders of bone density and structure, multiple sites: Secondary | ICD-10-CM

## 2018-05-23 DIAGNOSIS — I7300 Raynaud's syndrome without gangrene: Secondary | ICD-10-CM

## 2018-05-23 DIAGNOSIS — M19041 Primary osteoarthritis, right hand: Secondary | ICD-10-CM | POA: Diagnosis not present

## 2018-05-23 DIAGNOSIS — M0579 Rheumatoid arthritis with rheumatoid factor of multiple sites without organ or systems involvement: Secondary | ICD-10-CM | POA: Diagnosis not present

## 2018-05-23 DIAGNOSIS — G8929 Other chronic pain: Secondary | ICD-10-CM

## 2018-05-23 DIAGNOSIS — M19042 Primary osteoarthritis, left hand: Secondary | ICD-10-CM

## 2018-05-23 DIAGNOSIS — Z8709 Personal history of other diseases of the respiratory system: Secondary | ICD-10-CM

## 2018-05-23 DIAGNOSIS — Z8719 Personal history of other diseases of the digestive system: Secondary | ICD-10-CM

## 2018-05-23 DIAGNOSIS — M3501 Sicca syndrome with keratoconjunctivitis: Secondary | ICD-10-CM | POA: Diagnosis not present

## 2018-05-23 DIAGNOSIS — M5136 Other intervertebral disc degeneration, lumbar region: Secondary | ICD-10-CM

## 2018-05-23 DIAGNOSIS — Z79899 Other long term (current) drug therapy: Secondary | ICD-10-CM

## 2018-05-23 DIAGNOSIS — Z8659 Personal history of other mental and behavioral disorders: Secondary | ICD-10-CM

## 2018-05-23 MED ORDER — HYDROXYCHLOROQUINE SULFATE 200 MG PO TABS: ORAL_TABLET | ORAL | 0 refills | Status: DC

## 2018-05-24 LAB — COMPLETE METABOLIC PANEL WITH GFR
AG Ratio: 1.7 (calc) (ref 1.0–2.5)
ALBUMIN MSPROF: 4 g/dL (ref 3.6–5.1)
ALKALINE PHOSPHATASE (APISO): 88 U/L (ref 33–130)
ALT: 12 U/L (ref 6–29)
AST: 25 U/L (ref 10–35)
BUN/Creatinine Ratio: 14 (calc) (ref 6–22)
BUN: 15 mg/dL (ref 7–25)
CALCIUM: 9.1 mg/dL (ref 8.6–10.4)
CHLORIDE: 102 mmol/L (ref 98–110)
CO2: 28 mmol/L (ref 20–32)
CREATININE: 1.09 mg/dL — AB (ref 0.60–0.93)
GFR, EST NON AFRICAN AMERICAN: 50 mL/min/{1.73_m2} — AB (ref >=60)
GFR, Est African American: 58 mL/min/{1.73_m2} — ABNORMAL LOW (ref >=60)
GLOBULIN: 2.4 g/dL (ref 1.9–3.7)
GLUCOSE: 90 mg/dL (ref 65–99)
POTASSIUM: 4.3 mmol/L (ref 3.5–5.3)
Sodium: 138 mmol/L (ref 135–146)
TOTAL PROTEIN: 6.4 g/dL (ref 6.1–8.1)
Total Bilirubin: 0.3 mg/dL (ref 0.2–1.2)

## 2018-05-24 LAB — CBC WITH DIFFERENTIAL/PLATELET
BASOS PCT: 0.8 %
Basophils Absolute: 50 cells/uL (ref 0–200)
EOS ABS: 410 {cells}/uL (ref 15–500)
Eosinophils Relative: 6.5 %
HCT: 33.7 % — ABNORMAL LOW (ref 35.0–45.0)
HEMOGLOBIN: 11.6 g/dL — AB (ref 11.7–15.5)
LYMPHS ABS: 2022 {cells}/uL (ref 850–3900)
MCH: 33 pg (ref 27.0–33.0)
MCHC: 34.4 g/dL (ref 32.0–36.0)
MCV: 95.7 fL (ref 80.0–100.0)
MPV: 11 fL (ref 7.5–12.5)
Monocytes Relative: 8.6 %
NEUTROS ABS: 3276 {cells}/uL (ref 1500–7800)
Neutrophils Relative %: 52 %
PLATELETS: 271 10*3/uL (ref 140–400)
RBC: 3.52 10*6/uL — AB (ref 3.80–5.10)
RDW: 12.3 % (ref 11.0–15.0)
TOTAL LYMPHOCYTE: 32.1 %
WBC mixed population: 542 cells/uL (ref 200–950)
WBC: 6.3 10*3/uL (ref 3.8–10.8)

## 2018-05-24 NOTE — Progress Notes (Signed)
Anemia stable. Creatinine elevated but improved. GFR is low.  Please advise patient to avoid NSAIDs.  Please forward labs to PCP. We will continue to monitor.

## 2018-06-04 ENCOUNTER — Other Ambulatory Visit (INDEPENDENT_AMBULATORY_CARE_PROVIDER_SITE_OTHER): Payer: Self-pay | Admitting: Specialist

## 2018-06-04 NOTE — Telephone Encounter (Signed)
Tramadol refill request 

## 2018-06-05 DIAGNOSIS — H18452 Nodular corneal degeneration, left eye: Secondary | ICD-10-CM | POA: Diagnosis not present

## 2018-06-05 DIAGNOSIS — Z01818 Encounter for other preprocedural examination: Secondary | ICD-10-CM | POA: Diagnosis not present

## 2018-06-05 DIAGNOSIS — H5789 Other specified disorders of eye and adnexa: Secondary | ICD-10-CM | POA: Diagnosis not present

## 2018-06-06 NOTE — Telephone Encounter (Signed)
I called and advised the meds were denied

## 2018-06-28 ENCOUNTER — Telehealth (INDEPENDENT_AMBULATORY_CARE_PROVIDER_SITE_OTHER): Payer: Self-pay | Admitting: Specialist

## 2018-06-28 ENCOUNTER — Ambulatory Visit (INDEPENDENT_AMBULATORY_CARE_PROVIDER_SITE_OTHER): Payer: PPO | Admitting: Specialist

## 2018-06-28 NOTE — Telephone Encounter (Signed)
Pt called she requesting a refill on her medication. Pt # [phone]

## 2018-06-29 ENCOUNTER — Telehealth (INDEPENDENT_AMBULATORY_CARE_PROVIDER_SITE_OTHER): Payer: Self-pay | Admitting: Specialist

## 2018-06-29 ENCOUNTER — Other Ambulatory Visit (INDEPENDENT_AMBULATORY_CARE_PROVIDER_SITE_OTHER): Payer: Self-pay | Admitting: Radiology

## 2018-06-29 MED ORDER — TRAMADOL HCL 50 MG PO TABS: 50.0000 mg | ORAL_TABLET | Freq: Four times a day (QID) | ORAL | 0 refills | Status: DC | PRN

## 2018-06-29 NOTE — Telephone Encounter (Signed)
Patient returned your call.  CB#[phone].  Thank you.

## 2018-06-29 NOTE — Telephone Encounter (Signed)
I called and lmom that I called her rx to the pharm

## 2018-06-29 NOTE — Telephone Encounter (Signed)
Ok to refill 

## 2018-07-06 DIAGNOSIS — R195 Other fecal abnormalities: Secondary | ICD-10-CM | POA: Diagnosis not present

## 2018-07-06 DIAGNOSIS — R1011 Right upper quadrant pain: Secondary | ICD-10-CM | POA: Diagnosis not present

## 2018-07-06 DIAGNOSIS — K5900 Constipation, unspecified: Secondary | ICD-10-CM | POA: Diagnosis not present

## 2018-07-17 ENCOUNTER — Encounter (INDEPENDENT_AMBULATORY_CARE_PROVIDER_SITE_OTHER): Payer: Self-pay | Admitting: Specialist

## 2018-07-17 ENCOUNTER — Ambulatory Visit (INDEPENDENT_AMBULATORY_CARE_PROVIDER_SITE_OTHER): Payer: PPO | Admitting: Specialist

## 2018-07-17 VITALS — BP 133/69 | HR 67 | Ht 61.0 in | Wt 126.0 lb

## 2018-07-17 DIAGNOSIS — M48062 Spinal stenosis, lumbar region with neurogenic claudication: Secondary | ICD-10-CM | POA: Diagnosis not present

## 2018-07-17 DIAGNOSIS — M5136 Other intervertebral disc degeneration, lumbar region: Secondary | ICD-10-CM

## 2018-07-17 DIAGNOSIS — G9519 Other vascular myelopathies: Secondary | ICD-10-CM

## 2018-07-17 NOTE — Progress Notes (Signed)
Office Visit Note   Patient: Brenda Hanna           Date of Birth: 1940-07-26           MRN: [id number] Visit Date: 07/17/2018              Requested by: Myrlene Broker, MD [place], [place] [address] PCP: Myrlene Broker, MD   Assessment & Plan: Visit Diagnoses:  1. Spinal stenosis of lumbar region with neurogenic claudication   2. Neurogenic claudication   3. Other intervertebral disc degeneration, lumbar region     Plan: The patient's worsening back pain and lower extremity radicular symptoms recommend repeating lumbar MRI and compared to study that was performed last year.  Patient follow-up in 3 weeks for recheck with Dr.nitka to review MRI.  I did briefly discussed with patient and her husband was present that ultimately may come down her needing surgery.   patient was fairly adamant and states that she does not want surgery.  On exam patient also had marked tenderness over the bilateral SI joints.  We may consider trying diagnostic/therapeutic SI joint injections in the future to see if this will at least help some of her lower back pain.   Follow-Up Instructions: Return in about 3 weeks (around 08/07/2018) for With Dr. Louanne Skye in 3 weeks to review lumbar MRI.   Orders:  Orders Placed This Encounter  Procedures  . MR Lumbar Spine w/o contrast   No orders of the defined types were placed in this encounter.     Procedures: No procedures performed   Clinical Data: No additional findings.   Subjective: No chief complaint on file.    HPI 78 year old white female returns with complaints of low back pain and lower extremity radiculopathy.  States that symptoms have progressively worsened since her last office visit with Dr. Louanne Skye.  States that she has pain with standing, sitting, ambulating.  She has to constantly keep moving trying to decrease her pain.  Last MRI performed August 05, 2017 and report showed:  L3-L4: Broad-based disc bulge  eccentric towards the left. Moderate bilateral facet arthropathy with ligamentum flavum infolding resulting in mild spinal stenosis. Bilateral lateral recess stenosis, left greater than right. No evidence of neural foraminal stenosis.  L4-L5: Broad-based disc bulge with a central subligamentous disc extrusion with cephalad migration of disc material. Moderate bilateral facet arthropathy. Bilateral lateral recess narrowing, left greater than right. Moderate right foraminal stenosis. Mild left foraminal stenosis.  L5-S1: No significant disc bulge. No evidence of neural foraminal stenosis. No central canal stenosis. Moderate right and mild left facet arthropathy.  IMPRESSION: 1. At L3-4 there is a broad-based disc bulge eccentric towards the left. Moderate bilateral facet arthropathy with ligamentum flavum infolding resulting in mild spinal stenosis. Bilateral lateral recess stenosis, left greater than right. 2. At L4-5 there is a broad-based disc bulge with a central subligamentous disc extrusion with cephalad migration of disc material. Moderate bilateral facet arthropathy. Bilateral lateral recess narrowing, left greater than right. Moderate right foraminal stenosis. Mild left foraminal stenosis.   She is still not able to walk long distances.  History of rheumatoid arthritis and is being followed by Dr. Estanislado Pandy. Taking tramadol for pain.      Review of Systems No current cardiac pulmonary GI GU issues  Objective: Vital Signs: BP 133/69   Pulse 67   Ht 5\' 1"  (1.549 m)   Wt 126 lb (57.2 kg)   BMI  23.81 kg/m   Physical Exam Constitutional:      Comments: Upon entering the room patient is standing against the wall due to her back pain.  Mild to marked lumbar paraspinal tenderness.  Also moderate to marked tender over the bilateral SI joints.  Negative logroll.  Negative straight leg raise.  Bilateral calves nontender.  No focal motor deficits.  HENT:     Head:  Atraumatic.  Pulmonary:     Effort: Pulmonary effort is normal. No respiratory distress.  Skin:    General: Skin is warm and dry.     Ortho Exam  Specialty Comments:  No specialty comments available.  Imaging: No results found.   PMFS History: Patient Active Problem List   Diagnosis Date Noted  . Rheumatoid arthritis involving multiple sites with positive rheumatoid factor ([place]) 07/14/2016  . High risk medication use 07/14/2016  . Sjogren's syndrome with keratoconjunctivitis sicca ([place]) 07/14/2016  . Raynaud's disease without gangrene 07/14/2016  . Achilles tendinitis of both lower extremities 07/14/2016  . Fibromyalgia 07/14/2016  . Anxiety and depression 07/14/2016  . History of asthma 07/14/2016  . Environmental allergies 07/14/2016  . Gastroesophageal reflux disease without esophagitis 07/14/2016  . Neuropathy 07/14/2016  . Osteopenia of multiple sites 07/14/2016  . Pain in both hands 07/14/2016  . Pain in both feet 07/14/2016   Past Medical History:  Diagnosis Date  . Back pain     Family History  Problem Relation Age of Onset  . Heart failure Mother   . Heart failure Father   . Healthy Daughter   . Healthy Daughter   . Healthy Son     Past Surgical History:  Procedure Laterality Date  . ABDOMINAL HYSTERECTOMY    . BLADDER SURGERY     Social History   Occupational History  . Not on file  Tobacco Use  . Smoking status: Never Smoker  . Smokeless tobacco: Never Used  Substance and Sexual Activity  . Alcohol use: No  . Drug use: No  . Sexual activity: Not on file

## 2018-07-24 DIAGNOSIS — M0609 Rheumatoid arthritis without rheumatoid factor, multiple sites: Secondary | ICD-10-CM | POA: Diagnosis not present

## 2018-07-26 ENCOUNTER — Ambulatory Visit
Admission: RE | Admit: 2018-07-26 | Discharge: 2018-07-26 | Disposition: A | Discharge status: Home / Self Care | Payer: PPO | Source: Ambulatory Visit | Attending: Surgery | Admitting: Surgery

## 2018-07-26 ENCOUNTER — Other Ambulatory Visit: Payer: PPO

## 2018-07-26 ENCOUNTER — Other Ambulatory Visit (INDEPENDENT_AMBULATORY_CARE_PROVIDER_SITE_OTHER): Payer: Self-pay | Admitting: Physician Assistant

## 2018-07-26 DIAGNOSIS — M5136 Other intervertebral disc degeneration, lumbar region: Secondary | ICD-10-CM

## 2018-07-26 DIAGNOSIS — M48061 Spinal stenosis, lumbar region without neurogenic claudication: Secondary | ICD-10-CM | POA: Diagnosis not present

## 2018-07-26 NOTE — Telephone Encounter (Signed)
JN PT 

## 2018-07-26 NOTE — Telephone Encounter (Signed)
Tramadol refill requst

## 2018-07-26 NOTE — Telephone Encounter (Signed)
Nitka patient.  Will need to send to him or james for approval

## 2018-08-09 DIAGNOSIS — H25813 Combined forms of age-related cataract, bilateral: Secondary | ICD-10-CM | POA: Diagnosis not present

## 2018-08-09 DIAGNOSIS — Z01818 Encounter for other preprocedural examination: Secondary | ICD-10-CM | POA: Diagnosis not present

## 2018-08-09 DIAGNOSIS — H25811 Combined forms of age-related cataract, right eye: Secondary | ICD-10-CM | POA: Diagnosis not present

## 2018-08-14 DIAGNOSIS — H25811 Combined forms of age-related cataract, right eye: Secondary | ICD-10-CM | POA: Diagnosis not present

## 2018-08-14 DIAGNOSIS — H2511 Age-related nuclear cataract, right eye: Secondary | ICD-10-CM | POA: Diagnosis not present

## 2018-08-23 DIAGNOSIS — H2512 Age-related nuclear cataract, left eye: Secondary | ICD-10-CM | POA: Diagnosis not present

## 2018-08-23 DIAGNOSIS — H25812 Combined forms of age-related cataract, left eye: Secondary | ICD-10-CM | POA: Diagnosis not present

## 2018-08-30 ENCOUNTER — Encounter (INDEPENDENT_AMBULATORY_CARE_PROVIDER_SITE_OTHER): Payer: Self-pay | Admitting: Specialist

## 2018-08-30 ENCOUNTER — Ambulatory Visit (INDEPENDENT_AMBULATORY_CARE_PROVIDER_SITE_OTHER): Payer: PPO | Admitting: Specialist

## 2018-08-30 VITALS — BP 196/81 | HR 68 | Ht 61.0 in | Wt 126.0 lb

## 2018-08-30 DIAGNOSIS — M48062 Spinal stenosis, lumbar region with neurogenic claudication: Secondary | ICD-10-CM | POA: Diagnosis not present

## 2018-08-30 DIAGNOSIS — M5126 Other intervertebral disc displacement, lumbar region: Secondary | ICD-10-CM

## 2018-08-30 DIAGNOSIS — M5136 Other intervertebral disc degeneration, lumbar region: Secondary | ICD-10-CM

## 2018-08-30 MED ORDER — TRAMADOL HCL 50 MG PO TABS: 50.0000 mg | ORAL_TABLET | Freq: Four times a day (QID) | ORAL | 0 refills | Status: DC | PRN

## 2018-08-30 NOTE — Patient Instructions (Signed)
Avoid frequent bending and stooping  No lifting greater than 10 lbs. May use ice or moist heat for pain. Weight loss is of benefit. Tramadol for pain. Exercise is important to improve your indurance and does allow people to function better inspite of back pain.

## 2018-08-30 NOTE — Progress Notes (Signed)
Office Visit Note   Patient: Brenda Hanna           Date of Birth: [birth year]           MRN: [id number] Visit Date: 08/30/2018              Requested by: Myrlene Broker, MD [place], [place] [address] PCP: Myrlene Broker, MD   Assessment & Plan: Visit Diagnoses: No diagnosis found.  Plan: Avoid frequent bending and stooping  No lifting greater than 10 lbs. May use ice or moist heat for pain. Weight loss is of benefit. Tramadol for pain. Exercise is important to improve your indurance and does allow people to function better inspite of back pain.  Follow-Up Instructions: Return in about 6 months (around 02/28/2019).   Orders:  No orders of the defined types were placed in this encounter.  Meds ordered this encounter  Medications  . traMADol (ULTRAM) 50 MG tablet    Sig: Take 1 tablet (50 mg total) by mouth every 6 (six) hours as needed.    Dispense:  40 tablet    Refill:  0      Procedures: No procedures performed   Clinical Data: No additional findings.   Subjective: Chief Complaint  Patient presents with  . Lower Back - Follow-up    MRI Review, no changes in her symptoms.    78 year old female with low back pain and bilateral posterior thigh pain going down the backs of the legs. Pain with bending and sitting for prolong periods. No bowel or bladder difficulty. Can't lie flat on the back due to pinching of the nerves but can lie on her sides.    Review of Systems  Constitutional: Positive for unexpected weight change. Negative for activity change, appetite change, chills, diaphoresis, fatigue and fever.  HENT: Negative for congestion, dental problem, drooling, ear discharge, ear pain, facial swelling, hearing loss, mouth sores, nosebleeds, postnasal drip, rhinorrhea, sinus pressure, sinus pain, sneezing, sore throat, tinnitus, trouble swallowing and voice change.   Respiratory: Positive for choking and shortness of breath.  Negative for apnea, cough, chest tightness and wheezing.   Cardiovascular: Negative for chest pain, palpitations and leg swelling.  Gastrointestinal: Negative for abdominal distention, abdominal pain, diarrhea, nausea, rectal pain and vomiting.  Endocrine: Negative for cold intolerance, heat intolerance, polydipsia, polyphagia and polyuria.  Genitourinary: Positive for genital sores. Negative for difficulty urinating, dyspareunia, dysuria, enuresis, flank pain and frequency.  Musculoskeletal: Positive for back pain. Negative for arthralgias, gait problem, joint swelling, myalgias, neck pain and neck stiffness.  Skin: Negative for color change, pallor, rash and wound.  Allergic/Immunologic: Negative for environmental allergies, food allergies and immunocompromised state.  Neurological: Negative for dizziness, tremors, seizures, syncope, facial asymmetry, speech difficulty, light-headedness, numbness and headaches.  Hematological: Negative for adenopathy. Does not bruise/bleed easily.  Psychiatric/Behavioral: Negative for agitation, behavioral problems, confusion, decreased concentration, dysphoric mood, hallucinations, self-injury, sleep disturbance and suicidal ideas. The patient is not nervous/anxious and is not hyperactive.      Objective: Vital Signs: BP (!) 196/81 (BP Location: Left Arm, Patient Position: Sitting)   Pulse 68   Ht 5\' 1"  (1.549 m)   Wt 126 lb (57.2 kg)   BMI 23.81 kg/m   Physical Exam Constitutional:      Appearance: She is well-developed.  HENT:     Head: Normocephalic and atraumatic.  Eyes:     Pupils: Pupils are equal, round, and reactive to light.  Neck:     Musculoskeletal: Normal range of motion and neck supple.  Pulmonary:     Effort: Pulmonary effort is normal.     Breath sounds: Normal breath sounds.  Abdominal:     General: Bowel sounds are normal.     Palpations: Abdomen is soft.  Skin:    General: Skin is warm and dry.  Neurological:     Mental  Status: She is alert and oriented to person, place, and time.  Psychiatric:        Behavior: Behavior normal.        Thought Content: Thought content normal.        Judgment: Judgment normal.     Back Exam   Tenderness  The patient is experiencing tenderness in the lumbar.  Range of Motion  Extension: abnormal  Flexion: abnormal  Lateral bend right: normal  Lateral bend left: normal  Rotation right: normal  Rotation left: normal   Muscle Strength  Right Quadriceps:  5/5  Left Quadriceps:  5/5  Right Hamstrings:  5/5  Left Hamstrings:  5/5   Tests  Straight leg raise right: negative Straight leg raise left: negative  Reflexes  Patellar: 2/4 Achilles: 2/4 Babinski's sign: normal   Other  Toe walk: normal Heel walk: normal Sensation: normal Gait: normal  Erythema: no back redness Scars: absent      Specialty Comments:  No specialty comments available.  Imaging: No results found.   PMFS History: Patient Active Problem List   Diagnosis Date Noted  . Rheumatoid arthritis involving multiple sites with positive rheumatoid factor ([place]) 07/14/2016  . High risk medication use 07/14/2016  . Sjogren's syndrome with keratoconjunctivitis sicca ([place]) 07/14/2016  . Raynaud's disease without gangrene 07/14/2016  . Achilles tendinitis of both lower extremities 07/14/2016  . Fibromyalgia 07/14/2016  . Anxiety and depression 07/14/2016  . History of asthma 07/14/2016  . Environmental allergies 07/14/2016  . Gastroesophageal reflux disease without esophagitis 07/14/2016  . Neuropathy 07/14/2016  . Osteopenia of multiple sites 07/14/2016  . Pain in both hands 07/14/2016  . Pain in both feet 07/14/2016   Past Medical History:  Diagnosis Date  . Back pain     Family History  Problem Relation Age of Onset  . Heart failure Mother   . Heart failure Father   . Healthy Daughter   . Healthy Daughter   . Healthy Son     Past Surgical History:  Procedure  Laterality Date  . ABDOMINAL HYSTERECTOMY    . BLADDER SURGERY     Social History   Occupational History  . Not on file  Tobacco Use  . Smoking status: Never Smoker  . Smokeless tobacco: Never Used  Substance and Sexual Activity  . Alcohol use: No  . Drug use: No  . Sexual activity: Not on file

## 2018-10-19 NOTE — Progress Notes (Signed)
Virtual Visit via Telephone Note  I connected with Brenda Hanna on 10/19/18 at  2:00 PM EDT by telephone and verified that I am speaking with the correct person using two identifiers.   I discussed the limitations, risks, security and privacy concerns of performing an evaluation and management service by telephone and the availability of in person appointments. I also discussed with the patient that there may be a patient responsible charge related to this service. The patient expressed understanding and agreed to proceed. This service was conducted via virtual visit.  Both audio and visual tools were used.  The patient was located at home. I was located in my office.  Consent was obtained prior to the virtual visit and is aware of possible charges through their insurance for this visit.  The patient is an established patient.  Dr. Estanislado Pandy, MD conducted the virtual visit and Hazel Sams, PA-C acted as scribed during the service.  Office staff helped with scheduling follow up visits after the service was conducted.     CC: Lower back pain   History of Present Illness: Patient is a 77 year old female with a past medical history of seropositive rheumatoid arthritis, Sjogren's, Raynaud's, osteoarthritis, fibromyalgia, and DDD.  She is taking PLQ 200 mg 1 tablet in the morning and 1/2 tablet in the evening. She denies any recent flares.  She has occasional knee joint and wrist joint.  She is having right wrist swelling.  She has been gardening and performing yard work, which has exacerbated her RA. She has chronic lower back pain.  She has a ruptured disc and exercises radiculopathy bilaterally. She sees Dr. Louanne Skye.  She takes tramadol very sparingly for pain relief.  She would like a refill PLQ.   She had cataract surgery recently.  She has been using restasis eye drops. She also had a PLQ eye exam that was normal according to the patient. She has chronic mouth dryness.     Review of Systems   Constitutional: Negative for fever and malaise/fatigue.  HENT:       +Mouth dryness  Eyes: Negative for photophobia, pain, discharge and redness.       +Eye dryness  Respiratory: Negative for cough, shortness of breath and wheezing.   Cardiovascular: Negative for chest pain and palpitations.  Gastrointestinal: Positive for constipation. Negative for blood in stool and diarrhea.  Genitourinary: Negative for dysuria.  Musculoskeletal: Positive for back pain and joint pain. Negative for myalgias and neck pain.       +Morning stiffness +Right wrist swelling  Skin: Negative for rash.  Neurological: Negative for dizziness and headaches.  Psychiatric/Behavioral: Negative for depression. The patient is not nervous/anxious and does not have insomnia.      Observations/Objective: Physical Exam  Constitutional: She is oriented to person, place, and time.  Neurological: She is alert and oriented to person, place, and time.  Psychiatric: Mood, memory, affect and judgment normal.   Patient reports morning stiffness for 20 minutes.   Patient denies nocturnal pain.  Difficulty dressing/grooming: Denies Difficulty climbing stairs: Reports Difficulty getting out of chair: Reports Difficulty using hands for taps, buttons, cutlery, and/or writing: Denies  Assessment and Plan: Rheumatoid arthritis involving multiple sites with positive rheumatoid factor (HCC) - Positive RF, negative CCP: She is having pain in both wrist joints and both knee joints.  She currently has right wrist swelling.  She has been performing yard work daily, which has exacerbated her RA.  She is taking PLQ 200 mg  1 tablet in the morning and 1/2 tablet in the evening.  She has not missed any doses.  She will continue on this current treatment regimen.  A refill of PLQ will be sent to the pharmacy.  She will notify us if she develops increased joint pain or joint swelling.  She will follow up in 3-4 months.   High risk medication  use - Plaquenil 200 mg 1 tablet in the morning and 1/2 tablet in the evening. She had a recent PLQ eye exam which was normal according to the patient.  CBC and CMP were drawn on 05/24/19.  She is due to update lab work. Future orders were placed today.  A refill of PLQ was sent to the pharmacy.   Sjogren's syndrome with keratoconjunctivitis sicca ([place]): She has chronic sicca symptoms.  She uses restasis eye drops daily.  Future orders for CBC, CMP, SPEP, RF, and UA were placed today.   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  She has no digital ulcerations or signs of gangrene.   Primary osteoarthritis of both hands: She has no hand pain at this time.  She is experiencing right wrist pain and joint swelling.  She has complete fist formation.  Joint protection and muscle strengthening were discussed.    Fibromyalgia: Her fibromyalgia has been well controlled.  She has occasional generalized myalgias and muscle tenderness. She takes Ambien 10 mg by mouth at bedtime to help with insomnia.  Her level of fatigue has been stable.  She has been active working in the yard.  We discussed the importance of regular exercise and good sleep hygiene.   DDD (degenerative disc disease), lumbar: Chronic pain.  She continues to follow-up with Dr. Louanne Skye.  she has a ruptured disc and experiences bilateral radiculopathy.  Dr. Louanne Skye started her on tramadol 50 mg 1 tablet by mouth daily as needed for pain relief.   Chronic SI joint pain:She has no SI joint pain at this time.   Osteopenia of multiple sites: She takes a calcium and vitamin D supplement daily.  Follow Up Instructions: She will follow up in 3-4 months.  Future orders and standing orders were placed today.   I discussed the assessment and treatment plan with the patient. The patient was provided an opportunity to ask questions and all were answered. The patient agreed with the plan and demonstrated an  understanding of the instructions.   The patient was advised to call back or seek an in-person evaluation if the symptoms worsen or if the condition fails to improve as anticipated.  I provided 25 minutes of non-face-to-face time during this encounter. Bo Merino, MD   Scribed by-  Ofilia Neas, PA-C

## 2018-10-24 ENCOUNTER — Telehealth (INDEPENDENT_AMBULATORY_CARE_PROVIDER_SITE_OTHER): Payer: PPO | Admitting: Rheumatology

## 2018-10-24 ENCOUNTER — Encounter: Payer: Self-pay | Admitting: Rheumatology

## 2018-10-24 DIAGNOSIS — M797 Fibromyalgia: Secondary | ICD-10-CM | POA: Diagnosis not present

## 2018-10-24 DIAGNOSIS — M5136 Other intervertebral disc degeneration, lumbar region: Secondary | ICD-10-CM | POA: Diagnosis not present

## 2018-10-24 DIAGNOSIS — M19041 Primary osteoarthritis, right hand: Secondary | ICD-10-CM

## 2018-10-24 DIAGNOSIS — M3501 Sicca syndrome with keratoconjunctivitis: Secondary | ICD-10-CM

## 2018-10-24 DIAGNOSIS — M8589 Other specified disorders of bone density and structure, multiple sites: Secondary | ICD-10-CM | POA: Diagnosis not present

## 2018-10-24 DIAGNOSIS — M0579 Rheumatoid arthritis with rheumatoid factor of multiple sites without organ or systems involvement: Secondary | ICD-10-CM

## 2018-10-24 DIAGNOSIS — I7300 Raynaud's syndrome without gangrene: Secondary | ICD-10-CM

## 2018-10-24 DIAGNOSIS — Z8659 Personal history of other mental and behavioral disorders: Secondary | ICD-10-CM | POA: Diagnosis not present

## 2018-10-24 DIAGNOSIS — Z79899 Other long term (current) drug therapy: Secondary | ICD-10-CM | POA: Diagnosis not present

## 2018-10-24 DIAGNOSIS — M19042 Primary osteoarthritis, left hand: Secondary | ICD-10-CM

## 2018-10-24 DIAGNOSIS — G8929 Other chronic pain: Secondary | ICD-10-CM

## 2018-10-24 DIAGNOSIS — Z8719 Personal history of other diseases of the digestive system: Secondary | ICD-10-CM | POA: Diagnosis not present

## 2018-10-24 DIAGNOSIS — Z8669 Personal history of other diseases of the nervous system and sense organs: Secondary | ICD-10-CM

## 2018-10-24 DIAGNOSIS — Z8709 Personal history of other diseases of the respiratory system: Secondary | ICD-10-CM

## 2018-10-24 DIAGNOSIS — M533 Sacrococcygeal disorders, not elsewhere classified: Secondary | ICD-10-CM | POA: Diagnosis not present

## 2018-10-24 MED ORDER — HYDROXYCHLOROQUINE SULFATE 200 MG PO TABS: ORAL_TABLET | ORAL | 0 refills | Status: DC

## 2018-10-25 ENCOUNTER — Telehealth: Payer: Self-pay | Admitting: Rheumatology

## 2018-10-25 NOTE — Telephone Encounter (Signed)
LMOM to schedule follow-up appointment in 3-4 months.

## 2018-10-25 NOTE — Telephone Encounter (Signed)
-----   Message from [name], LPN sent at 9/79/1504  2:37 PM EDT ----- Please schedule patient for a follow up visit in 3-4 months. Patient was seen for a telemedicine visit today 10/24/18. Thanks!

## 2018-11-20 ENCOUNTER — Other Ambulatory Visit (INDEPENDENT_AMBULATORY_CARE_PROVIDER_SITE_OTHER): Payer: Self-pay | Admitting: Specialist

## 2018-11-20 NOTE — Telephone Encounter (Signed)
Tramadol refill request 

## 2018-11-22 ENCOUNTER — Other Ambulatory Visit: Payer: Self-pay | Admitting: *Deleted

## 2018-11-22 ENCOUNTER — Telehealth: Payer: Self-pay | Admitting: Rheumatology

## 2018-11-22 DIAGNOSIS — M0579 Rheumatoid arthritis with rheumatoid factor of multiple sites without organ or systems involvement: Secondary | ICD-10-CM

## 2018-11-22 DIAGNOSIS — Z79899 Other long term (current) drug therapy: Secondary | ICD-10-CM

## 2018-11-22 DIAGNOSIS — M3501 Sicca syndrome with keratoconjunctivitis: Secondary | ICD-10-CM

## 2018-11-22 NOTE — Telephone Encounter (Signed)
Patient at [facility] now on [employer] for labs. Please release orders.

## 2018-11-22 NOTE — Telephone Encounter (Signed)
Lab Orders released.  

## 2018-11-23 NOTE — Progress Notes (Signed)
Creatinine is elevated and GFR is low-48.  Please advise patient to avoid NSAIDs.   RBC count is mildly low.  We will continue to monitor.  UA reveals findings consistent with a UTI.  Please notify patient and advise patient to follow up with PCP for a urine culture.  Please forward labs to PCP.

## 2018-11-27 DIAGNOSIS — N3001 Acute cystitis with hematuria: Secondary | ICD-10-CM | POA: Diagnosis not present

## 2018-11-27 LAB — COMPLETE METABOLIC PANEL WITH GFR
AG Ratio: 1.5 (calc) (ref 1.0–2.5)
ALT: 15 U/L (ref 6–29)
AST: 28 U/L (ref 10–35)
Albumin: 4.1 g/dL (ref 3.6–5.1)
Alkaline phosphatase (APISO): 100 U/L (ref 37–153)
BUN/Creatinine Ratio: 13 (calc) (ref 6–22)
BUN: 14 mg/dL (ref 7–25)
CO2: 25 mmol/L (ref 20–32)
Calcium: 9.5 mg/dL (ref 8.6–10.4)
Chloride: 102 mmol/L (ref 98–110)
Creat: 1.12 mg/dL — ABNORMAL HIGH (ref 0.60–0.93)
GFR, Est African American: 56 mL/min/{1.73_m2} — ABNORMAL LOW (ref >=60)
GFR, Est Non African American: 48 mL/min/{1.73_m2} — ABNORMAL LOW (ref >=60)
Globulin: 2.7 g/dL (calc) (ref 1.9–3.7)
Glucose, Bld: 91 mg/dL (ref 65–139)
Potassium: 4.4 mmol/L (ref 3.5–5.3)
Sodium: 138 mmol/L (ref 135–146)
Total Bilirubin: 0.5 mg/dL (ref 0.2–1.2)
Total Protein: 6.8 g/dL (ref 6.1–8.1)

## 2018-11-27 LAB — URINALYSIS, ROUTINE W REFLEX MICROSCOPIC
Bilirubin Urine: NEGATIVE
Glucose, UA: NEGATIVE
Hgb urine dipstick: NEGATIVE
Hyaline Cast: NONE SEEN /LPF
Ketones, ur: NEGATIVE
Nitrite: POSITIVE — AB
Protein, ur: NEGATIVE
RBC / HPF: NONE SEEN /HPF (ref 0–2)
Specific Gravity, Urine: 1.007 (ref 1.001–1.03)
Squamous Epithelial / HPF: NONE SEEN /HPF (ref <=5)
pH: 5.5 (ref 5.0–8.0)

## 2018-11-27 LAB — CBC WITH DIFFERENTIAL/PLATELET
Absolute Monocytes: 517 cells/uL (ref 200–950)
Basophils Absolute: 61 cells/uL (ref 0–200)
Basophils Relative: 0.9 %
Eosinophils Absolute: 299 cells/uL (ref 15–500)
Eosinophils Relative: 4.4 %
HCT: 35.1 % (ref 35.0–45.0)
Hemoglobin: 12.1 g/dL (ref 11.7–15.5)
Lymphs Abs: 1510 cells/uL (ref 850–3900)
MCH: 33.1 pg — ABNORMAL HIGH (ref 27.0–33.0)
MCHC: 34.5 g/dL (ref 32.0–36.0)
MCV: 95.9 fL (ref 80.0–100.0)
MPV: 11.1 fL (ref 7.5–12.5)
Monocytes Relative: 7.6 %
Neutro Abs: 4413 cells/uL (ref 1500–7800)
Neutrophils Relative %: 64.9 %
Platelets: 288 10*3/uL (ref 140–400)
RBC: 3.66 10*6/uL — ABNORMAL LOW (ref 3.80–5.10)
RDW: 12.2 % (ref 11.0–15.0)
Total Lymphocyte: 22.2 %
WBC: 6.8 10*3/uL (ref 3.8–10.8)

## 2018-11-27 LAB — PROTEIN ELECTROPHORESIS, SERUM, WITH REFLEX
Albumin ELP: 4.1 g/dL (ref 3.8–4.8)
Alpha 1: 0.3 g/dL (ref 0.2–0.3)
Alpha 2: 0.7 g/dL (ref 0.5–0.9)
Beta 2: 0.3 g/dL (ref 0.2–0.5)
Beta Globulin: 0.5 g/dL (ref 0.4–0.6)
Gamma Globulin: 0.9 g/dL (ref 0.8–1.7)
Total Protein: 6.7 g/dL (ref 6.1–8.1)

## 2018-11-27 LAB — RHEUMATOID FACTOR: Rheumatoid fact SerPl-aCnc: 484 IU/mL — ABNORMAL HIGH (ref <14)

## 2018-11-27 NOTE — Progress Notes (Signed)
RF is positive and trending down.  SPEP WNL.  We will recheck RF and SPEP.

## 2018-12-03 DIAGNOSIS — H539 Unspecified visual disturbance: Secondary | ICD-10-CM | POA: Diagnosis not present

## 2019-01-30 ENCOUNTER — Other Ambulatory Visit: Payer: Self-pay | Admitting: Physician Assistant

## 2019-01-30 DIAGNOSIS — Z79899 Other long term (current) drug therapy: Secondary | ICD-10-CM

## 2019-01-30 DIAGNOSIS — M0579 Rheumatoid arthritis with rheumatoid factor of multiple sites without organ or systems involvement: Secondary | ICD-10-CM

## 2019-01-30 NOTE — Telephone Encounter (Signed)
Last Visit: 10/24/18  Next Visit: due July/august 2020. Message sent to the front to schedule patient  Labs: 11/22/18 RBC count is mildly low Eye exam:  normal on 07/24/18  Okay to refill per Dr. Estanislado Pandy

## 2019-01-30 NOTE — Telephone Encounter (Signed)
Please schedule patient for a follow up visit. Patient due July/August 2020. Thanks!

## 2019-01-31 NOTE — Progress Notes (Signed)
Virtual Visit via Telephone Note  I connected with Brenda Hanna on 01/31/19 at  1:20 PM EDT by telephone and verified that I am speaking with the correct person using two identifiers.  Location: Patient: Home  Provider: Clinic  This service was conducted via virtual visit.   The patient was located at home. I was located in my office.  Consent was obtained prior to the virtual visit and is aware of possible charges through their insurance for this visit.  The patient is an established patient.  Dr. Estanislado Pandy, MD conducted the virtual visit and Hazel Sams, PA-C acted as scribe during the service.  Office staff helped with scheduling follow up visits after the service was conducted.    I discussed the limitations, risks, security and privacy concerns of performing an evaluation and management service by telephone and the availability of in person appointments. I also discussed with the patient that there may be a patient responsible charge related to this service. The patient expressed understanding and agreed to proceed.  CC: Right sided sciatica   History of Present Illness: Patient is a 78 year old female with a past medical history of seropositive rheumatoid arthritis, Sjogren's, fibromyalgia, and DDD.  She is on PLQ 200 mg 1 tablet in the morning and 1/2 tablet in the evening. She denies any recent rheumatoid arthritis flares. She denies any hand pain or joint swelling currently.   She has been having increased lower back pain. She states she recently had a fall on concert, which exacerbated the lower back pain. She states she has a slipped disc in her lower back.  She is having difficulty walking and right sided sciatica. she is using a cane. She has an appointment with Dr. Louanne Skye on Monday.  She is taking tramadol as needed for pain relief.  She continues to have chronic sicca symptoms.  She uses OTC mouth rinse for mouth dryness.  She uses restasis and omega 3 drops in both eyes as needed  for eye dryness. She has intermittent symptoms of Raynaud's.   She continues to have generalized muscle aches and muscle tenderness due to fibromyalgia.  She has been unable to exercise due to her lower back pain.    Review of Systems  Constitutional: Positive for malaise/fatigue. Negative for fever.  HENT:       +Dry Mouth  Eyes: Negative for photophobia, pain, discharge and redness.       +Eye dryness  Respiratory: Negative for cough, shortness of breath and wheezing.   Cardiovascular: Negative for chest pain and palpitations.  Gastrointestinal: Negative for blood in stool, constipation and diarrhea.  Genitourinary: Negative for dysuria.  Musculoskeletal: Positive for back pain and joint pain. Negative for myalgias and neck pain.  Skin: Negative for rash.       +Raynaud's  Neurological: Negative for dizziness and headaches.  Psychiatric/Behavioral: Negative for depression. The patient is not nervous/anxious and does not have insomnia.       Observations/Objective: Physical Exam  Constitutional: She is oriented to person, place, and time.  Neurological: She is alert and oriented to person, place, and time.  Psychiatric: Mood, memory, affect and judgment normal.   Patient reports morning stiffness for 5 minutes.   Patient reports nocturnal pain.  Difficulty dressing/grooming: Denies Difficulty climbing stairs: Reports Difficulty getting out of chair: Reports Difficulty using hands for taps, buttons, cutlery, and/or writing: Denies   Assessment and Plan: Rheumatoid arthritis involving multiple sites with positive rheumatoid factor ([place])- Positive RF, negative  CCP: She has no joint pain or joint swelling at this time.  She has not had any recent rheumatoid arthritis flares.  She is clinically doing well on Plaquenil 200 mg 1 tablet by mouth in the morning and 1/2 tablet in the evening.  She will continue on this current treatment regimen.  She does not need any refills at this  time. She was advised to notify us if she develops increased joint pain or joint swelling. She will follow up in 5 months.  High risk medication use - Plaquenil 200 mg 1 tablet in the morning and 1/2 tablet in the evening. CBC and CMP were drawn on 11/22/18.  She will be due for lab work in October and every 5 months.   Sjogren's syndrome with keratoconjunctivitis sicca ([place]): She has chronic sicca symptoms.  She uses oral rinse to help with mouth dryness and restasis and omega 3 drops for eye dryness.  She will continue taking Plaquenil as prescribed.   Raynaud's disease without gangrene: She has intermittent symptoms of Raynaud's.  She has no had any digital ulcerations or signs of gangrene.  Primary osteoarthritis of both hands: She has no hand pain or joint swelling at this time.  She has no difficulty with ADLs. Joint protection and muscle strengthening were discussed.   Fibromyalgia: Her fibromyalgia pain has been manageable.  She has mild muscle aches and muscle tenderness.  She continues to have chronic fatigue. She has had difficulty sleeping at night due to the pain in her lower back.   DDD (degenerative disc disease), lumbar: She has chronic lower back pain.  She recently fell on concrete, which has exacerbated her lower back pain.  According to the patient she has a slipped disc.  She is having difficulty walking without a cane and has had right sided sciatica.  She has been taking tramadol as needed for pain relief.  She has an appointment with Dr. Louanne Skye on Monday.    Chronic SI joint pain:She has no SI joint pain at this time.   Osteopenia of multiple sites: She takes a calcium and vitamin D supplement daily.  Follow Up Instructions: She will follow up in 5 months.    I discussed the assessment and treatment plan with the patient. The patient was provided an opportunity to ask questions and all were answered. The patient agreed with the plan and demonstrated an understanding  of the instructions.   The patient was advised to call back or seek an in-person evaluation if the symptoms worsen or if the condition fails to improve as anticipated.  I provided 15 minutes of non-face-to-face time during this encounter.  I examined and evaluated the patient with Hazel Sams PA. The plan of care was discussed as noted above.  Bo Merino, MD   Scribed by-  Ofilia Neas, PA-C

## 2019-02-06 ENCOUNTER — Telehealth: Payer: Self-pay

## 2019-02-06 ENCOUNTER — Telehealth: Payer: Self-pay | Admitting: Physical Medicine and Rehabilitation

## 2019-02-06 NOTE — Telephone Encounter (Signed)
Patient called stating that she is not able to walk due to having back pain since Sunday.  Would like to know if a referral can be put in for injection with Dr. Ernestina Patches ASAP?--please advise

## 2019-02-06 NOTE — Telephone Encounter (Signed)
Patient called stating that she is not able to walk due to having back pain since Sunday.  Would like to know if a referral can be put in for injection with Dr. Ernestina Patches ASAP?  CB# is [phone].  Please advise.  Thank you.

## 2019-02-06 NOTE — Telephone Encounter (Signed)
IC the patient to set up an appointment with Dr. Louanne Skye.  In speaking with the patient, she is wanting to get in to get an injection with Dr. Ernestina Patches.  CB#[phone].  Thank you.

## 2019-02-06 NOTE — Telephone Encounter (Signed)
Please advise. Bilateral L4 TF 10/18/17. Multiple calls today from patient's husband and daughter. Message sent earlier to Dr. Louanne Skye, but no response as he is in clinic. I advised that patient's daughter that we do not do emergency appointments for injections and that we were awaiting a response from Dr. Louanne Skye since he saw her most recently. She is asking if there is anything she can do for the pain until an injection can be scheduled.

## 2019-02-07 NOTE — Telephone Encounter (Signed)
Spoke with pt and she will accept the appt on 02/11/2019 at 1:15P w/driver. Per Dr. Ernestina Patches advised to pt that this is a 15 min appt and it is for an injection only and if she needs to have an evaluation she would have to schedule appt for a diff day. Also advised to pt that she needs to schedule f/u appt with Dr. Louanne Skye post inj . Pt agreed.

## 2019-02-07 NOTE — Telephone Encounter (Signed)
You could see if there is anywhere to squeeze in L4-5 interlaminar epidural steroid injection and it would have to be a 15-minute slot in the afternoon that there is not much in the way of evaluation but they need to get a follow-up with Dr. Louanne Skye.  Her case is complicated by underlying fibromyalgia which is documented through Dr. Risa Grill notes.  She does have disc herniation at L4-5 but that is been unchanged since last year and MRI was updated earlier in the year.  It appears that there was a follow-up with Dr. Louanne Skye recommended at his last visit but it does not look like she has had that.

## 2019-02-08 ENCOUNTER — Encounter: Payer: Self-pay | Admitting: Rheumatology

## 2019-02-08 ENCOUNTER — Other Ambulatory Visit: Payer: Self-pay

## 2019-02-08 ENCOUNTER — Telehealth (INDEPENDENT_AMBULATORY_CARE_PROVIDER_SITE_OTHER): Payer: PPO | Admitting: Rheumatology

## 2019-02-08 DIAGNOSIS — Z79899 Other long term (current) drug therapy: Secondary | ICD-10-CM

## 2019-02-08 DIAGNOSIS — Z8709 Personal history of other diseases of the respiratory system: Secondary | ICD-10-CM

## 2019-02-08 DIAGNOSIS — Z8669 Personal history of other diseases of the nervous system and sense organs: Secondary | ICD-10-CM | POA: Diagnosis not present

## 2019-02-08 DIAGNOSIS — M533 Sacrococcygeal disorders, not elsewhere classified: Secondary | ICD-10-CM | POA: Diagnosis not present

## 2019-02-08 DIAGNOSIS — M3501 Sicca syndrome with keratoconjunctivitis: Secondary | ICD-10-CM | POA: Diagnosis not present

## 2019-02-08 DIAGNOSIS — M797 Fibromyalgia: Secondary | ICD-10-CM

## 2019-02-08 DIAGNOSIS — Z8719 Personal history of other diseases of the digestive system: Secondary | ICD-10-CM | POA: Diagnosis not present

## 2019-02-08 DIAGNOSIS — I7300 Raynaud's syndrome without gangrene: Secondary | ICD-10-CM

## 2019-02-08 DIAGNOSIS — M0579 Rheumatoid arthritis with rheumatoid factor of multiple sites without organ or systems involvement: Secondary | ICD-10-CM | POA: Diagnosis not present

## 2019-02-08 DIAGNOSIS — M5136 Other intervertebral disc degeneration, lumbar region: Secondary | ICD-10-CM

## 2019-02-08 DIAGNOSIS — M19041 Primary osteoarthritis, right hand: Secondary | ICD-10-CM

## 2019-02-08 DIAGNOSIS — M19042 Primary osteoarthritis, left hand: Secondary | ICD-10-CM

## 2019-02-08 DIAGNOSIS — M8589 Other specified disorders of bone density and structure, multiple sites: Secondary | ICD-10-CM

## 2019-02-08 DIAGNOSIS — G8929 Other chronic pain: Secondary | ICD-10-CM

## 2019-02-08 DIAGNOSIS — Z8659 Personal history of other mental and behavioral disorders: Secondary | ICD-10-CM

## 2019-02-11 ENCOUNTER — Encounter: Payer: Self-pay | Admitting: Physical Medicine and Rehabilitation

## 2019-02-11 ENCOUNTER — Ambulatory Visit: Payer: Self-pay

## 2019-02-11 ENCOUNTER — Ambulatory Visit (INDEPENDENT_AMBULATORY_CARE_PROVIDER_SITE_OTHER): Payer: PPO | Admitting: Physical Medicine and Rehabilitation

## 2019-02-11 ENCOUNTER — Other Ambulatory Visit (INDEPENDENT_AMBULATORY_CARE_PROVIDER_SITE_OTHER): Payer: Self-pay | Admitting: Specialist

## 2019-02-11 VITALS — BP 150/71 | HR 63

## 2019-02-11 DIAGNOSIS — M5416 Radiculopathy, lumbar region: Secondary | ICD-10-CM | POA: Diagnosis not present

## 2019-02-11 MED ORDER — METHYLPREDNISOLONE ACETATE 80 MG/ML IJ SUSP
80.0000 mg | Freq: Once | INTRAMUSCULAR | Status: AC
  Administered 2019-02-11: 80 mg

## 2019-02-11 NOTE — Progress Notes (Signed)
 .  Numeric Pain Rating Scale and Functional Assessment Average Pain 9   In the last MONTH (on 0-10 scale) has pain interfered with the following?  1. General activity like being  able to carry out your everyday physical activities such as walking, climbing stairs, carrying groceries, or moving a chair?  Rating(8)   +Driver, -BT, +Dye Allergies(Contrast Media).

## 2019-02-12 NOTE — Progress Notes (Signed)
Brenda Hanna - 78 y.o. female MRN [id number]  Date of birth: 12-25-1940  Office Visit Note: Visit Date: 02/11/2019 PCP: Myrlene Broker, MD Referred by: Myrlene Broker, MD  Subjective: Chief Complaint  Patient presents with  . Lower Back - Pain  . Right Leg - Pain   HPI:  Brenda Hanna is a 78 y.o. female who comes in today Comes in today for work and epidural injection at L4-5.  She is a patient followed by Dr. Basil Dess.  It was requested for epidural injection to help with her low back pain that radiates more in the right buttock and right leg.  We saw her and the early part of the spring 2019 for bilateral L4 transforaminal injection.  She had known disc herniation extrusion at L4-5.  Her case is complicated by seropositive rheumatoid arthritis, Sjogren's, Raynaud's, osteoarthritis, fibromyalgia, and depression anxiety.  She is present today with her husband.  She has not had any focal weakness or bowel or bladder changes or trauma.  We will have her follow-up with Dr. Louanne Skye after the injection.  She ambulates without aid and arises from a seated position without much difficulty.  She does have allergies to contrast media and shellfish which are not related but are noted on the chart.  She had many questions and I think I answered everyone of them probably not to her satisfaction but the best I could.  I was happy to see her although is very hard to try to get people in urgently as this is a big issue we have other urgent patients with pain.  ROS Otherwise per HPI.  Assessment & Plan: Visit Diagnoses:  1. Lumbar radiculopathy     Plan: No additional findings.   Meds & Orders:  Meds ordered this encounter  Medications  . methylPREDNISolone acetate (DEPO-MEDROL) injection 80 mg    Orders Placed This Encounter  Procedures  . XR C-ARM NO REPORT  . Epidural Steroid injection    Follow-up: Return in about 2 weeks (around 02/25/2019) for Basil Dess, MD.   Procedures: No  procedures performed  Lumbar Epidural Steroid Injection - Interlaminar Approach with Fluoroscopic Guidance  Patient: Brenda Hanna      Date of Birth: 1941-01-24 MRN: [id number] PCP: Myrlene Broker, MD      Visit Date: 02/11/2019   Universal Protocol:     Consent Given By: the patient  Position: PRONE  Additional Comments: Vital signs were monitored before and after the procedure. Patient was prepped and draped in the usual sterile fashion. The correct patient, procedure, and site was verified.   Injection Procedure Details:  Procedure Site One Meds Administered:  Meds ordered this encounter  Medications  . methylPREDNISolone acetate (DEPO-MEDROL) injection 80 mg     Laterality: Right  Location/Site:  L4-L5  Needle size: 20 G  Needle type: Tuohy  Needle Placement: Paramedian epidural  Findings:   -Comments: Excellent flow of contrast into the epidural space.  Procedure Details: Using a paramedian approach from the side mentioned above, the region overlying the inferior lamina was localized under fluoroscopic visualization and the soft tissues overlying this structure were infiltrated with 4 ml. of 1% Lidocaine without Epinephrine. The Tuohy needle was inserted into the epidural space using a paramedian approach.   The epidural space was localized using loss of resistance along with lateral and bi-planar fluoroscopic views.  After negative aspirate for air, blood, and CSF, a 2 ml. volume of Omniscan (contrast allergy)  was injected into the epidural space and the flow of contrast was observed. Radiographs were obtained for documentation purposes.    The injectate was administered into the level noted above.   Additional Comments:  The patient tolerated the procedure well Dressing: 2 x 2 sterile gauze and Band-Aid    Post-procedure details: Patient was observed during the procedure. Post-procedure instructions were reviewed.  Patient left the clinic in  stable condition.   Clinical History: MRI LUMBAR SPINE WITHOUT CONTRAST  TECHNIQUE: Multiplanar, multisequence MR imaging of the lumbar spine was performed. No intravenous contrast was administered.  COMPARISON:  Comparison made with previous MRI from 08/05/2017.  FINDINGS: Segmentation: Standard. Lowest well-formed disc labeled the L5-S1 level.  Alignment: Mild straightening of the normal lumbar lordosis. No listhesis or subluxation.  Vertebrae: Vertebral body heights maintained without evidence for acute or chronic fracture. Bone marrow signal intensity mildly heterogeneous without discrete or worrisome osseous lesions. Reactive endplate changes present about the L3-4 and L4-5 interspaces. No other abnormal marrow edema.  Conus medullaris and cauda equina: Conus extends to the L1 level. Conus and cauda equina appear normal.  Paraspinal and other soft tissues: Paraspinous soft tissues within normal limits. 4.8 cm cyst partially visualize within the left kidney. Visualized visceral structures otherwise unremarkable.  Disc levels:  L1-2: Minimal annular disc bulge. Mild facet and ligament flavum hypertrophy. No canal or foraminal stenosis.  L2-3: Negative interspace. Mild bilateral facet and ligament flavum hypertrophy. No canal or foraminal stenosis.  L3-4: Chronic intervertebral disc space narrowing with diffuse disc bulge and disc desiccation. Disc bulging asymmetric to the left. Associated reactive endplate changes with marginal endplate osteophytic spurring. Mild to moderate facet and ligament flavum hypertrophy. Resultant moderate canal with bilateral lateral recess stenosis, worse on the left. Neural foramina remain patent. Overall, appearance is similar to previous.  L4-5: Chronic intervertebral disc space narrowing with diffuse disc bulge and disc desiccation. Reactive endplate changes with marginal endplate osteophytic spurring. Superimposed  central disc extrusion with superior migration, slightly asymmetric to the left (series 6, image 26). Superimposed moderate facet and ligament flavum hypertrophy. Resultant mild canal with mild to moderate left greater than right lateral recess stenosis. Moderate right with mild left L4 foraminal narrowing. Appearance is relatively stable from previous.  L5-S1: Negative interspace. Moderate right with mild left facet hypertrophy. No significant canal or foraminal stenosis. No impingement.  IMPRESSION: 1. Overall, no significant interval change in appearance of the lumbar spine as compared to previous MRI from 08/05/2017. 2. Left eccentric disc bulge with facet hypertrophy at L3-4 with resultant moderate canal with left greater than right lateral recess stenosis. 3. Degenerative disc bulging with superimposed central disc extrusion with superior migration at L4-5, with resultant mild to moderate bilateral lateral recess stenosis, left greater than right. Mild left with moderate right L4 foraminal narrowing.   Electronically Signed   By: Jeannine Boga M.D.   On: 07/26/2018 19:39     Objective:  VS:  HT:    WT:   BMI:     BP:(!) 150/71  HR:63bpm  TEMP: ( )  RESP:  Physical Exam  Ortho Exam Imaging: Xr C-arm No Report  Result Date: 02/11/2019 Please see Notes tab for imaging impression.

## 2019-02-12 NOTE — Procedures (Signed)
Lumbar Epidural Steroid Injection - Interlaminar Approach with Fluoroscopic Guidance  Patient: Brenda Hanna      Date of Birth: 1940-10-04 MRN: [id number] PCP: Myrlene Broker, MD      Visit Date: 02/11/2019   Universal Protocol:     Consent Given By: the patient  Position: PRONE  Additional Comments: Vital signs were monitored before and after the procedure. Patient was prepped and draped in the usual sterile fashion. The correct patient, procedure, and site was verified.   Injection Procedure Details:  Procedure Site One Meds Administered:  Meds ordered this encounter  Medications  . methylPREDNISolone acetate (DEPO-MEDROL) injection 80 mg     Laterality: Right  Location/Site:  L4-L5  Needle size: 20 G  Needle type: Tuohy  Needle Placement: Paramedian epidural  Findings:   -Comments: Excellent flow of contrast into the epidural space.  Procedure Details: Using a paramedian approach from the side mentioned above, the region overlying the inferior lamina was localized under fluoroscopic visualization and the soft tissues overlying this structure were infiltrated with 4 ml. of 1% Lidocaine without Epinephrine. The Tuohy needle was inserted into the epidural space using a paramedian approach.   The epidural space was localized using loss of resistance along with lateral and bi-planar fluoroscopic views.  After negative aspirate for air, blood, and CSF, a 2 ml. volume of Omniscan (contrast allergy) was injected into the epidural space and the flow of contrast was observed. Radiographs were obtained for documentation purposes.    The injectate was administered into the level noted above.   Additional Comments:  The patient tolerated the procedure well Dressing: 2 x 2 sterile gauze and Band-Aid    Post-procedure details: Patient was observed during the procedure. Post-procedure instructions were reviewed.  Patient left the clinic in stable condition.

## 2019-02-14 ENCOUNTER — Other Ambulatory Visit (INDEPENDENT_AMBULATORY_CARE_PROVIDER_SITE_OTHER): Payer: Self-pay | Admitting: Specialist

## 2019-02-14 NOTE — Telephone Encounter (Signed)
Please advise 

## 2019-02-15 NOTE — Telephone Encounter (Signed)
I called rx into [facility]

## 2019-03-08 DIAGNOSIS — R5383 Other fatigue: Secondary | ICD-10-CM | POA: Diagnosis not present

## 2019-03-08 DIAGNOSIS — Z13220 Encounter for screening for lipoid disorders: Secondary | ICD-10-CM | POA: Diagnosis not present

## 2019-03-08 DIAGNOSIS — R5381 Other malaise: Secondary | ICD-10-CM | POA: Diagnosis not present

## 2019-03-08 DIAGNOSIS — F5104 Psychophysiologic insomnia: Secondary | ICD-10-CM | POA: Diagnosis not present

## 2019-03-08 DIAGNOSIS — K219 Gastro-esophageal reflux disease without esophagitis: Secondary | ICD-10-CM | POA: Diagnosis not present

## 2019-03-08 DIAGNOSIS — Z0000 Encounter for general adult medical examination without abnormal findings: Secondary | ICD-10-CM | POA: Diagnosis not present

## 2019-03-08 DIAGNOSIS — J3089 Other allergic rhinitis: Secondary | ICD-10-CM | POA: Diagnosis not present

## 2019-03-08 DIAGNOSIS — F419 Anxiety disorder, unspecified: Secondary | ICD-10-CM | POA: Diagnosis not present

## 2019-03-08 DIAGNOSIS — E039 Hypothyroidism, unspecified: Secondary | ICD-10-CM | POA: Diagnosis not present

## 2019-03-18 ENCOUNTER — Ambulatory Visit: Payer: PPO | Admitting: Specialist

## 2019-03-28 ENCOUNTER — Ambulatory Visit: Payer: PPO | Admitting: Specialist

## 2019-04-17 ENCOUNTER — Ambulatory Visit: Payer: PPO | Admitting: Specialist

## 2019-04-25 ENCOUNTER — Other Ambulatory Visit: Payer: Self-pay

## 2019-04-25 ENCOUNTER — Encounter: Payer: Self-pay | Admitting: Specialist

## 2019-04-25 ENCOUNTER — Ambulatory Visit (INDEPENDENT_AMBULATORY_CARE_PROVIDER_SITE_OTHER): Payer: PPO | Admitting: Specialist

## 2019-04-25 VITALS — BP 154/67 | HR 69 | Ht 60.0 in | Wt 127.0 lb

## 2019-04-25 DIAGNOSIS — M48062 Spinal stenosis, lumbar region with neurogenic claudication: Secondary | ICD-10-CM

## 2019-04-25 MED ORDER — TRAMADOL HCL 50 MG PO TABS: 50.0000 mg | ORAL_TABLET | Freq: Four times a day (QID) | ORAL | 0 refills | Status: DC | PRN

## 2019-04-25 NOTE — Progress Notes (Signed)
Office Visit Note   Patient: Brenda Hanna           Date of Birth: 22-Dec-1940           MRN: [id number] Visit Date: 04/25/2019              Requested by: Myrlene Broker, MD [place],  [place] [address] PCP: Myrlene Broker, MD   Assessment & Plan: Visit Diagnoses:  1. Spinal stenosis of lumbar region with neurogenic claudication     Plan: Avoid bending, stooping and avoid lifting weights greater than 10 lbs. Avoid prolong standing and walking. Avoid frequent bending and stooping  No lifting greater than 10 lbs. May use ice or moist heat for pain. Weight loss is of benefit. Handicap license is approved. Dr. Romona Curls secretary/Assistant will call to arrange for epidural steroid injection    Follow-Up Instructions: Return in about 4 years (around 04/25/2023).   Orders:  Orders Placed This Encounter  Procedures  . Ambulatory referral to Physical Medicine Rehab   Meds ordered this encounter  Medications  . traMADol (ULTRAM) 50 MG tablet    Sig: Take 1 tablet (50 mg total) by mouth every 6 (six) hours as needed.    Dispense:  30 tablet    Refill:  0      Procedures: No procedures performed   Clinical Data: No additional findings.   Subjective: Chief Complaint  Patient presents with  . Lower Back - Follow-up    78 year old female with history of lumbar spinal stenosis she has had ESI by Dr. Ernestina Patches with relief of pain for about 3-4 weeks and then the pain gradually began to recur. This is the third ESI she has had. She report the pain began to recur about the middle of September. It is in the posterior thigh right greater than left and is worse when she first starts to get up and walk and then improves. No bowel or bladder difficulty except with some constipation with the use of tramadol. The meds for RA also irritates her, hydroxychloroquine.    Review of Systems  Constitutional: Negative.  Negative for activity change, appetite  change, chills, diaphoresis, fatigue, fever and unexpected weight change.  HENT: Negative.  Negative for congestion, dental problem, drooling, ear discharge, ear pain, facial swelling, hearing loss, mouth sores, nosebleeds, postnasal drip, rhinorrhea, sinus pressure, sinus pain, sneezing, sore throat, tinnitus, trouble swallowing and voice change.   Eyes: Negative.  Negative for photophobia, pain, discharge, redness, itching and visual disturbance.  Respiratory: Positive for shortness of breath and wheezing. Negative for apnea, cough, choking, chest tightness and stridor.   Cardiovascular: Negative.  Negative for chest pain, palpitations and leg swelling.  Gastrointestinal: Negative.  Negative for abdominal distention, abdominal pain, anal bleeding, blood in stool, constipation, diarrhea, nausea and vomiting.  Endocrine: Negative.  Negative for cold intolerance, heat intolerance, polydipsia, polyphagia and polyuria.  Genitourinary: Negative for difficulty urinating, dyspareunia, dysuria, enuresis, flank pain, frequency, hematuria and urgency.  Musculoskeletal: Positive for back pain and gait problem. Negative for arthralgias, joint swelling, myalgias, neck pain and neck stiffness.  Skin: Negative.  Negative for color change, pallor, rash and wound.  Allergic/Immunologic: Negative.   Neurological: Positive for weakness and numbness. Negative for dizziness, tremors, seizures, syncope, facial asymmetry, speech difficulty, light-headedness and headaches.  Hematological: Bruises/bleeds easily.  Psychiatric/Behavioral: Negative.  Negative for agitation, behavioral problems, confusion, decreased concentration, dysphoric mood, hallucinations, self-injury, sleep disturbance and suicidal ideas. The  patient is not nervous/anxious and is not hyperactive.      Objective: Vital Signs: BP (!) 154/67 (BP Location: Left Arm, Patient Position: Sitting)   Pulse 69   Ht 5' (1.524 m)   Wt 127 lb (57.6 kg)   BMI  24.80 kg/m   Physical Exam Constitutional:      Appearance: She is well-developed.  HENT:     Head: Normocephalic and atraumatic.  Eyes:     Pupils: Pupils are equal, round, and reactive to light.  Neck:     Musculoskeletal: Normal range of motion and neck supple.  Pulmonary:     Effort: Pulmonary effort is normal.     Breath sounds: Normal breath sounds.  Abdominal:     General: Bowel sounds are normal.     Palpations: Abdomen is soft.  Skin:    General: Skin is warm and dry.  Neurological:     Mental Status: She is alert and oriented to person, place, and time.  Psychiatric:        Behavior: Behavior normal.        Thought Content: Thought content normal.        Judgment: Judgment normal.     Back Exam   Tenderness  The patient is experiencing tenderness in the cervical.  Range of Motion  Extension: normal  Flexion: normal  Lateral bend right: normal  Lateral bend left: normal  Rotation right: normal  Rotation left: normal   Muscle Strength  Right Quadriceps:  5/5  Left Quadriceps:  5/5  Right Hamstrings:  5/5  Left Hamstrings:  5/5   Tests  Straight leg raise right: negative Straight leg raise left: negative  Reflexes  Patellar: 2/4 Achilles: 2/4 Babinski's sign: normal   Other  Toe walk: normal Heel walk: normal Sensation: normal Gait: abnormal  Erythema: no back redness Scars: absent      Specialty Comments:  No specialty comments available.  Imaging: No results found.   PMFS History: Patient Active Problem List   Diagnosis Date Noted  . Rheumatoid arthritis involving multiple sites with positive rheumatoid factor ([place]) 07/14/2016  . High risk medication use 07/14/2016  . Sjogren's syndrome with keratoconjunctivitis sicca ([place]) 07/14/2016  . Raynaud's disease without gangrene 07/14/2016  . Achilles tendinitis of both lower extremities 07/14/2016  . Fibromyalgia 07/14/2016  . Anxiety and depression 07/14/2016  . History of  asthma 07/14/2016  . Environmental allergies 07/14/2016  . Gastroesophageal reflux disease without esophagitis 07/14/2016  . Neuropathy 07/14/2016  . Osteopenia of multiple sites 07/14/2016  . Pain in both hands 07/14/2016  . Pain in both feet 07/14/2016   Past Medical History:  Diagnosis Date  . Back pain     Family History  Problem Relation Age of Onset  . Heart failure Mother   . Heart failure Father   . Healthy Daughter   . Healthy Daughter   . Healthy Son     Past Surgical History:  Procedure Laterality Date  . ABDOMINAL HYSTERECTOMY    . BLADDER SURGERY     Social History   Occupational History  . Not on file  Tobacco Use  . Smoking status: Never Smoker  . Smokeless tobacco: Never Used  Substance and Sexual Activity  . Alcohol use: No  . Drug use: No  . Sexual activity: Not on file

## 2019-04-25 NOTE — Patient Instructions (Signed)
Avoid bending, stooping and avoid lifting weights greater than 10 lbs. Avoid prolong standing and walking. Avoid frequent bending and stooping  No lifting greater than 10 lbs. May use ice or moist heat for pain. Weight loss is of benefit. Handicap license is approved. Dr. Newton's secretary/Assistant will call to arrange for epidural steroid injection  

## 2019-05-02 DIAGNOSIS — Z23 Encounter for immunization: Secondary | ICD-10-CM | POA: Diagnosis not present

## 2019-05-20 ENCOUNTER — Other Ambulatory Visit: Payer: Self-pay

## 2019-05-20 ENCOUNTER — Telehealth: Payer: Self-pay | Admitting: Rheumatology

## 2019-05-20 ENCOUNTER — Ambulatory Visit: Payer: Self-pay

## 2019-05-20 ENCOUNTER — Encounter: Payer: Self-pay | Admitting: Physical Medicine and Rehabilitation

## 2019-05-20 ENCOUNTER — Ambulatory Visit (INDEPENDENT_AMBULATORY_CARE_PROVIDER_SITE_OTHER): Payer: PPO | Admitting: Physical Medicine and Rehabilitation

## 2019-05-20 VITALS — BP 158/77 | HR 65

## 2019-05-20 DIAGNOSIS — M5116 Intervertebral disc disorders with radiculopathy, lumbar region: Secondary | ICD-10-CM

## 2019-05-20 DIAGNOSIS — M0579 Rheumatoid arthritis with rheumatoid factor of multiple sites without organ or systems involvement: Secondary | ICD-10-CM

## 2019-05-20 DIAGNOSIS — M5416 Radiculopathy, lumbar region: Secondary | ICD-10-CM

## 2019-05-20 DIAGNOSIS — Z79899 Other long term (current) drug therapy: Secondary | ICD-10-CM

## 2019-05-20 MED ORDER — HYDROXYCHLOROQUINE SULFATE 200 MG PO TABS: ORAL_TABLET | ORAL | 0 refills | Status: DC

## 2019-05-20 MED ORDER — METHYLPREDNISOLONE ACETATE 80 MG/ML IJ SUSP
80.0000 mg | Freq: Once | INTRAMUSCULAR | Status: AC
  Administered 2019-05-20: 15:00:00 80 mg

## 2019-05-20 NOTE — Telephone Encounter (Signed)
Last Visit: 02/08/19 Next Visit: 07/18/19 Labs: 11/22/18 Creatinine is elevated and GFR is low-48. RBC count is mildly low.  PLQ eye exam normal on 07/24/18  Left message to advise patient she is due to update labs.   Okay to refill per Dr. Estanislado Pandy

## 2019-05-20 NOTE — Progress Notes (Signed)
 .  Numeric Pain Rating Scale and Functional Assessment Average Pain 7   In the last MONTH (on 0-10 scale) has pain interfered with the following?  1. General activity like being  able to carry out your everyday physical activities such as walking, climbing stairs, carrying groceries, or moving a chair?  Rating(6)   +Driver, -BT, +Dye Allergies(Contrast media).

## 2019-05-20 NOTE — Telephone Encounter (Signed)
Please review the previous message.

## 2019-05-20 NOTE — Telephone Encounter (Signed)
Patient request a refill on generic Plaquenil sent to [facility] on [address]

## 2019-06-25 ENCOUNTER — Other Ambulatory Visit: Payer: Self-pay | Admitting: Specialist

## 2019-07-09 ENCOUNTER — Other Ambulatory Visit: Payer: Self-pay | Admitting: Rheumatology

## 2019-07-09 DIAGNOSIS — Z79899 Other long term (current) drug therapy: Secondary | ICD-10-CM

## 2019-07-09 DIAGNOSIS — M0579 Rheumatoid arthritis with rheumatoid factor of multiple sites without organ or systems involvement: Secondary | ICD-10-CM

## 2019-07-09 NOTE — Telephone Encounter (Signed)
Patient will schedule her PLQ eye exam. Patient states she will call her PCP to update lab work. Okay to refill 30 day supply PLQ. Patient states she only has enough medication to last until 07/14/19.

## 2019-07-09 NOTE — Telephone Encounter (Signed)
Ok to refill 30 day supply today. We won't be able to send in another refill until after she updates lab work.

## 2019-07-09 NOTE — Telephone Encounter (Signed)
Last Visit: 02/08/19 Next Visit: 07/18/19 Labs: 11/22/18 Creat. 1.12 GFR 48 RBC 3.66, MCH 33.1 PLQ eye exam normal on 07/24/18  Okay to refill PLQ?

## 2019-07-09 NOTE — Telephone Encounter (Signed)
Please advise patient to also schedule an updated PLQ eye exam.

## 2019-07-09 NOTE — Telephone Encounter (Signed)
Patient is overdue to update lab work.   

## 2019-07-11 NOTE — Progress Notes (Signed)
Virtual Visit via Telephone Note  I connected with Brenda Hanna on 07/15/19 at  8:45 AM EST by telephone and verified that I am speaking with the correct person using two identifiers.  Location: Patient: Home  Provider: Clinic  This service was conducted via virtual visit.   The patient was located at home. I was located in my office.  Consent was obtained prior to the virtual visit and is aware of possible charges through their insurance for this visit.  The patient is an established patient.  Dr. Estanislado Pandy, MD conducted the virtual visit and Hazel Sams, PA-C acted as scribe during the service.  Office staff helped with scheduling follow up visits after the service was conducted.     I discussed the limitations, risks, security and privacy concerns of performing an evaluation and management service by telephone and the availability of in person appointments. I also discussed with the patient that there may be a patient responsible charge related to this service. The patient expressed understanding and agreed to proceed.  CC: Lower back pain History of Present Illness: Patient is a 79 year old female with a past medical history of seropositive rheumatoid arthritis, Sjogren's, fibromyalgia, and DDD.  She is on PLQ 200 mg 1 tablet in the morning and 1/2 tablet in the evening. She denies any recent rheumatoid arthritis flares. She has been experiencing increased arthralgias with cooler winter weather changes.  She denies any joint swelling. She has ongoing lower back pain, which she attributes to a ruptured disc in her lower back.  [place]  She has ongoing generalized muscle aches and muscle tenderness due to fibromyalgia.  She continues to have sicca symptoms and uses OTC products for symptomatic relief. She has intermittent symptoms of Raynaud's but denies any digital ulcerations.   Review of Systems  Constitutional: Negative for fever and malaise/fatigue.   HENT:       +Dry mouth  Eyes: Negative for photophobia, pain, discharge and redness.       +Dry eyes  Respiratory: Negative for cough, shortness of breath and wheezing.   Cardiovascular: Negative for chest pain and palpitations.  Gastrointestinal: Negative for blood in stool, constipation and diarrhea.  Genitourinary: Negative for dysuria.  Musculoskeletal: Positive for back pain, joint pain and myalgias. Negative for neck pain.       +Morning stiffness   Skin: Negative for rash.       +Raynaud's  Neurological: Negative for dizziness and headaches.  Psychiatric/Behavioral: Negative for depression. The patient is not nervous/anxious and does not have insomnia.      Observations/Objective:  Physical Exam  Constitutional: She is oriented to person, place, and time.  Neurological: She is alert and oriented to person, place, and time.  Psychiatric: Mood, memory, affect and judgment normal.     Patient reports morning stiffness for 15 minutes.   Patient reports nocturnal pain.  Difficulty dressing/grooming: Denies Difficulty climbing stairs: Reports Difficulty getting out of chair: Denies Difficulty using hands for taps, buttons, cutlery, and/or writing: Denies  Assessment and Plan: Rheumatoid arthritis involving multiple sites with positive rheumatoid factor ([place])- Positive RF, negative CCP: She has not had any recent rheumatoid arthritis flares.  She has been experiencing increased arthralgias with cooler weather temperatures but does not have any joint inflammation.  Her morning stiffness has been lasting about 15 minutes daily.  She remains active. She is clinically doing well on Plaquenil 200 mg 1 tablet in the morning and 1/2  tablet in the evening.  She takes tramadol as needed for severe pain. She will continue on the current treatment regimen.  She was advised to notify us if she develops increased joint pain or joint swelling.  She will follow up in 4-5 months.   High risk  medication use - Plaquenil 200 mg1 tablet in the morning and 1/2 tablet in the evening. PLQeye exam: 07/24/2018-She has an upcoming eye exam at 09/11/18. CBC and CMP were drawn on 11/22/18.  She is overdue to update lab work. Standing orders are in place.   Sjogren's syndrome with keratoconjunctivitis sicca ([place]): RF 484 and SPEP WNL on 11/22/18: She has chronic sicca symptoms.  She continues to use restasis daily and OTC mouth rinse for symptomatic relief.   Raynaud's disease without gangrene: She has intermittent symptoms of Raynaud's.  She was encouraged to keep her body temperature warm and to wear gloves regularly.  We discussed the importance of drinking warm fluids.   Primary osteoarthritis of both hands: She has intermittent discomfort in both hands.  No joint swelling.  Joint protection and muscle strengthening were discussed.   Fibromyalgia: She has ongoing generalized muscle aches and muscle tenderness due to fibromyalgia. She takes tramadol as needed for pain relief.    DDD (degenerative disc disease), lumbar: She has chronic lower back pain.   Chronic SI joint pain: Her discomfort in intermittent and tolerable.   Osteopenia of multiple sites: She is taking a calcium and vitamin D supplement daily.   Follow Up Instructions: She will follow up in 4-5 months   I discussed the assessment and treatment plan with the patient. The patient was provided an opportunity to ask questions and all were answered. The patient agreed with the plan and demonstrated an understanding of the instructions.   The patient was advised to call back or seek an in-person evaluation if the symptoms worsen or if the condition fails to improve as anticipated.  I provided 25 minutes of non-face-to-face time during this encounter.   Hazel Sams, PA-C   Scribed by-  Hazel Sams PA-C

## 2019-07-15 ENCOUNTER — Encounter: Payer: Self-pay | Admitting: Rheumatology

## 2019-07-15 ENCOUNTER — Telehealth (INDEPENDENT_AMBULATORY_CARE_PROVIDER_SITE_OTHER): Payer: PPO | Admitting: Rheumatology

## 2019-07-15 ENCOUNTER — Other Ambulatory Visit: Payer: Self-pay

## 2019-07-15 DIAGNOSIS — M533 Sacrococcygeal disorders, not elsewhere classified: Secondary | ICD-10-CM

## 2019-07-15 DIAGNOSIS — M8589 Other specified disorders of bone density and structure, multiple sites: Secondary | ICD-10-CM

## 2019-07-15 DIAGNOSIS — I7300 Raynaud's syndrome without gangrene: Secondary | ICD-10-CM | POA: Diagnosis not present

## 2019-07-15 DIAGNOSIS — M3501 Sicca syndrome with keratoconjunctivitis: Secondary | ICD-10-CM

## 2019-07-15 DIAGNOSIS — M0579 Rheumatoid arthritis with rheumatoid factor of multiple sites without organ or systems involvement: Secondary | ICD-10-CM

## 2019-07-15 DIAGNOSIS — M5136 Other intervertebral disc degeneration, lumbar region: Secondary | ICD-10-CM | POA: Diagnosis not present

## 2019-07-15 DIAGNOSIS — M19041 Primary osteoarthritis, right hand: Secondary | ICD-10-CM

## 2019-07-15 DIAGNOSIS — Z79899 Other long term (current) drug therapy: Secondary | ICD-10-CM

## 2019-07-15 DIAGNOSIS — M797 Fibromyalgia: Secondary | ICD-10-CM | POA: Diagnosis not present

## 2019-07-15 DIAGNOSIS — G8929 Other chronic pain: Secondary | ICD-10-CM | POA: Diagnosis not present

## 2019-07-15 DIAGNOSIS — M19042 Primary osteoarthritis, left hand: Secondary | ICD-10-CM | POA: Diagnosis not present

## 2019-07-17 ENCOUNTER — Other Ambulatory Visit: Payer: Self-pay

## 2019-07-17 DIAGNOSIS — Z79899 Other long term (current) drug therapy: Secondary | ICD-10-CM

## 2019-07-18 ENCOUNTER — Telehealth: Payer: PPO | Admitting: Rheumatology

## 2019-07-23 DIAGNOSIS — Z79899 Other long term (current) drug therapy: Secondary | ICD-10-CM | POA: Diagnosis not present

## 2019-07-23 LAB — COMPLETE METABOLIC PANEL WITH GFR
AG Ratio: 1.8 (calc) (ref 1.0–2.5)
ALT: 13 U/L (ref 6–29)
AST: 27 U/L (ref 10–35)
Albumin: 4.3 g/dL (ref 3.6–5.1)
Alkaline phosphatase (APISO): 92 U/L (ref 37–153)
BUN/Creatinine Ratio: 15 (calc) (ref 6–22)
BUN: 15 mg/dL (ref 7–25)
CO2: 28 mmol/L (ref 20–32)
Calcium: 9.5 mg/dL (ref 8.6–10.4)
Chloride: 99 mmol/L (ref 98–110)
Creat: 0.97 mg/dL — ABNORMAL HIGH (ref 0.60–0.93)
GFR, Est African American: 66 mL/min/{1.73_m2} (ref >=60)
GFR, Est Non African American: 57 mL/min/{1.73_m2} — ABNORMAL LOW (ref >=60)
Globulin: 2.4 g/dL (calc) (ref 1.9–3.7)
Glucose, Bld: 73 mg/dL (ref 65–99)
Potassium: 4.8 mmol/L (ref 3.5–5.3)
Sodium: 137 mmol/L (ref 135–146)
Total Bilirubin: 0.4 mg/dL (ref 0.2–1.2)
Total Protein: 6.7 g/dL (ref 6.1–8.1)

## 2019-07-23 LAB — CBC WITH DIFFERENTIAL/PLATELET
Absolute Monocytes: 620 cells/uL (ref 200–950)
Basophils Absolute: 59 cells/uL (ref 0–200)
Basophils Relative: 0.9 %
Eosinophils Absolute: 251 cells/uL (ref 15–500)
Eosinophils Relative: 3.8 %
HCT: 34.9 % — ABNORMAL LOW (ref 35.0–45.0)
Hemoglobin: 11.8 g/dL (ref 11.7–15.5)
Lymphs Abs: 1907 cells/uL (ref 850–3900)
MCH: 32.7 pg (ref 27.0–33.0)
MCHC: 33.8 g/dL (ref 32.0–36.0)
MCV: 96.7 fL (ref 80.0–100.0)
MPV: 11.4 fL (ref 7.5–12.5)
Monocytes Relative: 9.4 %
Neutro Abs: 3762 cells/uL (ref 1500–7800)
Neutrophils Relative %: 57 %
Platelets: 270 10*3/uL (ref 140–400)
RBC: 3.61 10*6/uL — ABNORMAL LOW (ref 3.80–5.10)
RDW: 12 % (ref 11.0–15.0)
Total Lymphocyte: 28.9 %
WBC: 6.6 10*3/uL (ref 3.8–10.8)

## 2019-07-24 ENCOUNTER — Other Ambulatory Visit: Payer: Self-pay | Admitting: Specialist

## 2019-07-24 NOTE — Progress Notes (Signed)
Creatinine borderline elevated but improving.  GFR is 57-improving.  CBC stable.

## 2019-07-25 ENCOUNTER — Telehealth: Payer: Self-pay | Admitting: Rheumatology

## 2019-07-25 NOTE — Telephone Encounter (Signed)
I LMOM for patient to call ,and schedule a follow up appointment for around 12/13/19.

## 2019-07-25 NOTE — Telephone Encounter (Signed)
-----   Message from Shona Needles, RT sent at 07/15/2019  1:20 PM EST ----- Regarding: 4-5 MONTH F/U

## 2019-08-05 ENCOUNTER — Other Ambulatory Visit: Payer: Self-pay | Admitting: Rheumatology

## 2019-08-05 DIAGNOSIS — M0579 Rheumatoid arthritis with rheumatoid factor of multiple sites without organ or systems involvement: Secondary | ICD-10-CM

## 2019-08-05 DIAGNOSIS — Z79899 Other long term (current) drug therapy: Secondary | ICD-10-CM

## 2019-08-05 NOTE — Telephone Encounter (Signed)
Last Visit: 07/14/18 Next Visit: due May/June 2021 Labs: 07/23/19 Creatinine borderline elevated but improving. GFR is 57-improving. CBC stable. Eye exam: normal on 07/24/18  Okay to refill per Dr. Estanislado Pandy

## 2019-08-28 ENCOUNTER — Other Ambulatory Visit: Payer: Self-pay | Admitting: Specialist

## 2019-09-11 DIAGNOSIS — M0609 Rheumatoid arthritis without rheumatoid factor, multiple sites: Secondary | ICD-10-CM | POA: Diagnosis not present

## 2019-09-25 NOTE — Procedures (Signed)
Lumbar Epidural Steroid Injection - Interlaminar Approach with Fluoroscopic Guidance  Patient: Brenda Hanna      Date of Birth: 1941-03-14 MRN: [id number] PCP: Myrlene Broker, MD      Visit Date: 05/20/2019   Universal Protocol:     Consent Given By: the patient  Position: PRONE  Additional Comments: Vital signs were monitored before and after the procedure. Patient was prepped and draped in the usual sterile fashion. The correct patient, procedure, and site was verified.   Injection Procedure Details:  Procedure Site One Meds Administered:  Meds ordered this encounter  Medications  . methylPREDNISolone acetate (DEPO-MEDROL) injection 80 mg     Laterality: Right  Location/Site:  L4-L5  Needle size: 20 G  Needle type: Tuohy  Needle Placement: Paramedian epidural  Findings:   -Comments: Excellent flow of contrast into the epidural space.  Procedure Details: Using a paramedian approach from the side mentioned above, the region overlying the inferior lamina was localized under fluoroscopic visualization and the soft tissues overlying this structure were infiltrated with 4 ml. of 1% Lidocaine without Epinephrine. The Tuohy needle was inserted into the epidural space using a paramedian approach.   The epidural space was localized using loss of resistance along with lateral and bi-planar fluoroscopic views.  After negative aspirate for air, blood, and CSF, a 0.5 ml. volume of Isovue-250 was injected into the epidural space and the flow of contrast was observed. Radiographs were obtained for documentation purposes.    The injectate was administered into the level noted above.   Additional Comments:  The patient tolerated the procedure well Dressing: 2 x 2 sterile gauze and Band-Aid    Post-procedure details: Patient was observed during the procedure. Post-procedure instructions were reviewed.  Patient left the clinic in stable condition.

## 2019-09-25 NOTE — Progress Notes (Signed)
Brenda Hanna - 79 y.o. female MRN [id number]  Date of birth: 1940/09/22  Office Visit Note: Visit Date: 05/20/2019 PCP: Myrlene Broker, MD Referred by: Myrlene Broker, MD  Subjective: Chief Complaint  Patient presents with  . Lower Back - Pain  . Right Leg - Pain  . Right Ankle - Pain   HPI:  Brenda Hanna is a 79 y.o. female who comes in today At the request of Dr. Basil Dess for repeat L4-5 interlaminar epidural steroid injection.  Injections in the past have helped diagnostically but do not help very long.  She relates that helped for about 8 weeks.  I offered physical therapy today as an adjunct to treatment which she has not had recently but she refuses that and really just wants the injection.  I have suggested to her that we really cannot complete those every 2 months and she will have to follow-up with Dr. Merrilee Seashore in terms of timing.  I am happy to repeat the injection today.  Her pain is in the lower back and radiates to the right leg to the right ankle and L5 distribution.  She reports worsening with yard work and walking.  Her pain is a 7 out of 10.  She does have a contrast allergy.  Allergy to this was not anaphylactic.  We decided to use a very small bit of contrast today to just make sure we had good flow.  ROS Otherwise per HPI.  Assessment & Plan: Visit Diagnoses:  1. Lumbar radiculopathy   2. Radiculopathy due to lumbar intervertebral disc disorder     Plan: No additional findings.   Meds & Orders:  Meds ordered this encounter  Medications  . methylPREDNISolone acetate (DEPO-MEDROL) injection 80 mg    Orders Placed This Encounter  Procedures  . XR C-ARM NO REPORT  . Epidural Steroid injection    Follow-up: Return if symptoms worsen or fail to improve.   Procedures: No procedures performed  Lumbar Epidural Steroid Injection - Interlaminar Approach with Fluoroscopic Guidance  Patient: Brenda Hanna      Date of Birth: 08-22-1940 MRN:  [id number] PCP: Myrlene Broker, MD      Visit Date: 05/20/2019   Universal Protocol:     Consent Given By: the patient  Position: PRONE  Additional Comments: Vital signs were monitored before and after the procedure. Patient was prepped and draped in the usual sterile fashion. The correct patient, procedure, and site was verified.   Injection Procedure Details:  Procedure Site One Meds Administered:  Meds ordered this encounter  Medications  . methylPREDNISolone acetate (DEPO-MEDROL) injection 80 mg     Laterality: Right  Location/Site:  L4-L5  Needle size: 20 G  Needle type: Tuohy  Needle Placement: Paramedian epidural  Findings:   -Comments: Excellent flow of contrast into the epidural space.  Procedure Details: Using a paramedian approach from the side mentioned above, the region overlying the inferior lamina was localized under fluoroscopic visualization and the soft tissues overlying this structure were infiltrated with 4 ml. of 1% Lidocaine without Epinephrine. The Tuohy needle was inserted into the epidural space using a paramedian approach.   The epidural space was localized using loss of resistance along with lateral and bi-planar fluoroscopic views.  After negative aspirate for air, blood, and CSF, a 0.5 ml. volume of Isovue-250 was injected into the epidural space and the flow of contrast was observed. Radiographs were obtained for documentation purposes.    The injectate  was administered into the level noted above.   Additional Comments:  The patient tolerated the procedure well Dressing: 2 x 2 sterile gauze and Band-Aid    Post-procedure details: Patient was observed during the procedure. Post-procedure instructions were reviewed.  Patient left the clinic in stable condition.    Clinical History: MRI LUMBAR SPINE WITHOUT CONTRAST  TECHNIQUE: Multiplanar, multisequence MR imaging of the lumbar spine was performed. No intravenous contrast  was administered.  COMPARISON:  Comparison made with previous MRI from 08/05/2017.  FINDINGS: Segmentation: Standard. Lowest well-formed disc labeled the L5-S1 level.  Alignment: Mild straightening of the normal lumbar lordosis. No listhesis or subluxation.  Vertebrae: Vertebral body heights maintained without evidence for acute or chronic fracture. Bone marrow signal intensity mildly heterogeneous without discrete or worrisome osseous lesions. Reactive endplate changes present about the L3-4 and L4-5 interspaces. No other abnormal marrow edema.  Conus medullaris and cauda equina: Conus extends to the L1 level. Conus and cauda equina appear normal.  Paraspinal and other soft tissues: Paraspinous soft tissues within normal limits. 4.8 cm cyst partially visualize within the left kidney. Visualized visceral structures otherwise unremarkable.  Disc levels:  L1-2: Minimal annular disc bulge. Mild facet and ligament flavum hypertrophy. No canal or foraminal stenosis.  L2-3: Negative interspace. Mild bilateral facet and ligament flavum hypertrophy. No canal or foraminal stenosis.  L3-4: Chronic intervertebral disc space narrowing with diffuse disc bulge and disc desiccation. Disc bulging asymmetric to the left. Associated reactive endplate changes with marginal endplate osteophytic spurring. Mild to moderate facet and ligament flavum hypertrophy. Resultant moderate canal with bilateral lateral recess stenosis, worse on the left. Neural foramina remain patent. Overall, appearance is similar to previous.  L4-5: Chronic intervertebral disc space narrowing with diffuse disc bulge and disc desiccation. Reactive endplate changes with marginal endplate osteophytic spurring. Superimposed central disc extrusion with superior migration, slightly asymmetric to the left (series 6, image 26). Superimposed moderate facet and ligament flavum hypertrophy. Resultant mild canal with  mild to moderate left greater than right lateral recess stenosis. Moderate right with mild left L4 foraminal narrowing. Appearance is relatively stable from previous.  L5-S1: Negative interspace. Moderate right with mild left facet hypertrophy. No significant canal or foraminal stenosis. No impingement.  IMPRESSION: 1. Overall, no significant interval change in appearance of the lumbar spine as compared to previous MRI from 08/05/2017. 2. Left eccentric disc bulge with facet hypertrophy at L3-4 with resultant moderate canal with left greater than right lateral recess stenosis. 3. Degenerative disc bulging with superimposed central disc extrusion with superior migration at L4-5, with resultant mild to moderate bilateral lateral recess stenosis, left greater than right. Mild left with moderate right L4 foraminal narrowing.   Electronically Signed   By: Jeannine Boga M.D.   On: 07/26/2018 19:39     Objective:  VS:  HT:    WT:   BMI:     BP:(!) 158/77  HR:65bpm  TEMP: ( )  RESP:  Physical Exam  Ortho Exam Imaging: No results found.

## 2019-10-01 ENCOUNTER — Other Ambulatory Visit: Payer: Self-pay | Admitting: Specialist

## 2019-10-04 ENCOUNTER — Other Ambulatory Visit: Payer: Self-pay | Admitting: Specialist

## 2019-10-17 DIAGNOSIS — J40 Bronchitis, not specified as acute or chronic: Secondary | ICD-10-CM | POA: Diagnosis not present

## 2019-11-21 ENCOUNTER — Telehealth: Payer: Self-pay | Admitting: Rheumatology

## 2019-11-21 ENCOUNTER — Other Ambulatory Visit: Payer: Self-pay | Admitting: Rheumatology

## 2019-11-21 DIAGNOSIS — Z79899 Other long term (current) drug therapy: Secondary | ICD-10-CM

## 2019-11-21 DIAGNOSIS — M0579 Rheumatoid arthritis with rheumatoid factor of multiple sites without organ or systems involvement: Secondary | ICD-10-CM

## 2019-11-21 NOTE — Telephone Encounter (Signed)
Last Visit: 07/15/2019 telemedicine  Next Visit: left message on machine for patient to call and schedule.  Labs: 07/23/2019 Creatinine borderline elevated but improving. GFR is 57-improving. CBC stable. Eye exam: 07/24/2018   Attempted to contact patient and left message on machine to advise patient to call and schedule a follow up and also advised we need an updated plaquenil eye exam.   Okay to refill 30 day supply of plaquenil?

## 2019-11-21 NOTE — Telephone Encounter (Signed)
Patient states she had her Plaquenil eye exam 2 months ago at [facility] in [place].  Patient states the office told her they faxed a copy of the results.

## 2019-11-21 NOTE — Telephone Encounter (Signed)
ok 

## 2019-11-21 NOTE — Telephone Encounter (Signed)
I have called [facility] in [place] and requested most recent plaquenil eye exam. They will fax to our office.

## 2019-12-06 NOTE — Progress Notes (Deleted)
Office Visit Note  Patient: Brenda Hanna             Date of Birth: 09/13/1940           MRN: [id number]             PCP: Myrlene Broker, MD Referring: Myrlene Broker, MD Visit Date: 12/19/2019 Occupation: @GUAROCC @  Subjective:  No chief complaint on file.   History of Present Illness: Brenda Hanna is a 79 y.o. female ***   Activities of Daily Living:  Patient reports morning stiffness for *** {minute/hour:19697}.   Patient {ACTIONS;DENIES/REPORTS:21021675::"Denies"} nocturnal pain.  Difficulty dressing/grooming: {ACTIONS;DENIES/REPORTS:21021675::"Denies"} Difficulty climbing stairs: {ACTIONS;DENIES/REPORTS:21021675::"Denies"} Difficulty getting out of chair: {ACTIONS;DENIES/REPORTS:21021675::"Denies"} Difficulty using hands for taps, buttons, cutlery, and/or writing: {ACTIONS;DENIES/REPORTS:21021675::"Denies"}  No Rheumatology ROS completed.   PMFS History:  Patient Active Problem List   Diagnosis Date Noted   Rheumatoid arthritis involving multiple sites with positive rheumatoid factor ([place]) 07/14/2016   High risk medication use 07/14/2016   Sjogren's syndrome with keratoconjunctivitis sicca ([place]) 07/14/2016   Raynaud's disease without gangrene 07/14/2016   Achilles tendinitis of both lower extremities 07/14/2016   Fibromyalgia 07/14/2016   Anxiety and depression 07/14/2016   History of asthma 07/14/2016   Environmental allergies 07/14/2016   Gastroesophageal reflux disease without esophagitis 07/14/2016   Neuropathy 07/14/2016   Osteopenia of multiple sites 07/14/2016   Pain in both hands 07/14/2016   Pain in both feet 07/14/2016    Past Medical History:  Diagnosis Date   Back pain    Fibromyalgia    Osteoarthritis    Rheumatoid arteritis ([place])    Ruptured lumbar disc    Sjogren's disease ([place])     Family History  Problem Relation Age of Onset   Heart failure Mother    Heart failure Father    Healthy Daughter     Healthy Daughter    Healthy Son    Past Surgical History:  Procedure Laterality Date   ABDOMINAL HYSTERECTOMY     BLADDER SURGERY     Social History   Social History Narrative   Not on file   Immunization History  Administered Date(s) Administered   Influenza, High Dose Seasonal PF 04/06/2017   Zoster Recombinat (Shingrix) 01/31/2018     Objective: Vital Signs: There were no vitals taken for this visit.   Physical Exam   Musculoskeletal Exam: ***  CDAI Exam: CDAI Score: -- Patient Global: --; Provider Global: -- Swollen: --; Tender: -- Joint Exam 12/19/2019   No joint exam has been documented for this visit   There is currently no information documented on the homunculus. Go to the Rheumatology activity and complete the homunculus joint exam.  Investigation: No additional findings.  Imaging: No results found.  Recent Labs: Lab Results  Component Value Date   WBC 6.6 07/23/2019   HGB 11.8 07/23/2019   PLT 270 07/23/2019   NA 137 07/23/2019   K 4.8 07/23/2019   CL 99 07/23/2019   CO2 28 07/23/2019   GLUCOSE 73 07/23/2019   BUN 15 07/23/2019   CREATININE 0.97 (H) 07/23/2019   BILITOT 0.4 07/23/2019   ALKPHOS 103 02/22/2017   AST 27 07/23/2019   ALT 13 07/23/2019   PROT 6.7 07/23/2019   ALBUMIN 4.1 02/22/2017   CALCIUM 9.5 07/23/2019   GFRAA 66 07/23/2019    Speciality Comments:  PLQ eye exam normal on 07/24/18 per [facility]   Procedures:  No procedures performed Allergies: Methotrexate, Contrast media [iodinated diagnostic  agents], Penicillins, and Shellfish allergy   Assessment / Plan:     Visit Diagnoses: No diagnosis found.  Orders: No orders of the defined types were placed in this encounter.  No orders of the defined types were placed in this encounter.   Face-to-face time spent with patient was *** minutes. Greater than 50% of time was spent in counseling and coordination of care.  Follow-Up Instructions: No follow-ups on  file.   Ofilia Neas, PA-C  Note - This record has been created using Dragon software.  Chart creation errors have been sought, but may not always  have been located. Such creation errors do not reflect on  the standard of medical care.

## 2019-12-17 ENCOUNTER — Other Ambulatory Visit: Payer: Self-pay | Admitting: Rheumatology

## 2019-12-17 DIAGNOSIS — M0579 Rheumatoid arthritis with rheumatoid factor of multiple sites without organ or systems involvement: Secondary | ICD-10-CM

## 2019-12-17 DIAGNOSIS — Z79899 Other long term (current) drug therapy: Secondary | ICD-10-CM

## 2019-12-17 MED ORDER — HYDROXYCHLOROQUINE SULFATE 200 MG PO TABS: ORAL_TABLET | ORAL | 0 refills | Status: DC

## 2019-12-17 NOTE — Telephone Encounter (Signed)
Patient called requesting prescription refill of Plaquenil to be sent to [facility] at [address] in [place].

## 2019-12-17 NOTE — Telephone Encounter (Signed)
Please advise patient to get labs as soon as possible.  Okay to give 30-day supply of Plaquenil.

## 2019-12-17 NOTE — Telephone Encounter (Signed)
Last Visit: 07/15/2019 telemedicine  Next Visit: 01/22/2020  Labs: 07/23/2019 Creatinine borderline elevated but improving. GFR is 57-improving. CBC stable. Eye exam: 07/24/2018   Patient states she has had her PLQ eye exam. Patient will contact her eye doctor and have them fax the results. Patient advised she also due to update labs.  Okay to refill PLQ?

## 2019-12-19 ENCOUNTER — Ambulatory Visit: Payer: PPO | Admitting: Physician Assistant

## 2020-01-08 ENCOUNTER — Telehealth: Payer: Self-pay | Admitting: *Deleted

## 2020-01-08 NOTE — Progress Notes (Signed)
Office Visit Note  Patient: Brenda Hanna             Date of Birth: 1940/10/18           MRN: [id number]             PCP: Myrlene Broker, MD Referring: Myrlene Broker, MD Visit Date: 01/22/2020 Occupation: @GUAROCC @  Subjective:  Pain in both feet  History of Present Illness: Brenda Hanna is a 79 y.o. female with history of seropositive rheumatoid arthritis, Sjogren's syndrome, and fibromyalgia.  She is taking Plaquenil 200 mg 1 tablet in the morning and half tablet in the evening.  She is tolerating Plaquenil without any side effects.  She has not had any recent rheumatoid arthritis flares.  She has been experiencing increased pain in both feet which she attributes to neuropathy.  She has not been evaluated by her neurologist but thinks that her symptoms are consistent with neuropathy.  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  She is not having any joint swelling at this time.  Patient ports she continues to have chronic sicca symptoms.  She uses Restasis eyedrops and drinks lots of fluids throughout the day for symptomatic relief.  She states that she experiences intermittent myalgias and muscle tenderness due to fibromyalgia.  She takes tramadol sparingly for pain relief.    Activities of Daily Living:  Patient reports morning stiffness for 10-15  minutes.   Patient Reports nocturnal pain.  Difficulty dressing/grooming: Denies Difficulty climbing stairs: Denies Difficulty getting out of chair: Reports Difficulty using hands for taps, buttons, cutlery, and/or writing: Reports  Review of Systems  Constitutional: Positive for fatigue.  HENT: Positive for mouth dryness. Negative for mouth sores and nose dryness.   Eyes: Positive for dryness. Negative for pain.  Respiratory: Negative for shortness of breath and difficulty breathing.   Cardiovascular: Negative for chest pain and palpitations.  Gastrointestinal: Positive for  constipation. Negative for blood in stool and diarrhea.  Endocrine: Negative for increased urination.  Genitourinary: Negative for difficulty urinating.  Musculoskeletal: Positive for arthralgias, joint pain, joint swelling, myalgias, morning stiffness, muscle tenderness and myalgias.  Skin: Positive for color change and redness. Negative for rash.  Allergic/Immunologic: Negative for susceptible to infections.  Neurological: Negative for dizziness, numbness, headaches, memory loss and weakness.  Hematological: Positive for bruising/bleeding tendency.  Psychiatric/Behavioral: Negative for confusion.    PMFS History:  Patient Active Problem List   Diagnosis Date Noted  . Rheumatoid arthritis involving multiple sites with positive rheumatoid factor ([place]) 07/14/2016  . High risk medication use 07/14/2016  . Sjogren's syndrome with keratoconjunctivitis sicca ([place]) 07/14/2016  . Raynaud's disease without gangrene 07/14/2016  . Achilles tendinitis of both lower extremities 07/14/2016  . Fibromyalgia 07/14/2016  . Anxiety and depression 07/14/2016  . History of asthma 07/14/2016  . Environmental allergies 07/14/2016  . Gastroesophageal reflux disease without esophagitis 07/14/2016  . Neuropathy 07/14/2016  . Osteopenia of multiple sites 07/14/2016  . Pain in both hands 07/14/2016  . Pain in both feet 07/14/2016    Past Medical History:  Diagnosis Date  . Back pain   . Fibromyalgia   . Osteoarthritis   . Rheumatoid arteritis ([place])   . Ruptured lumbar disc   . Sjogren's disease ([place])     Family History  Problem Relation Age of Onset  . Heart failure Mother   . Heart failure Father   . Healthy Daughter   .  Healthy Daughter   . Healthy Son    Past Surgical History:  Procedure Laterality Date  . ABDOMINAL HYSTERECTOMY    . BLADDER SURGERY     Social History   Social History Narrative  . Not on file   Immunization History  Administered Date(s) Administered  . Influenza,  High Dose Seasonal PF 04/06/2017  . Zoster Recombinat (Shingrix) 01/31/2018     Objective: Vital Signs: BP (!) 154/78 (BP Location: Right Arm, Patient Position: Sitting, Cuff Size: Normal)   Pulse 64   Resp 16   Ht 5\' 1"  (1.549 m)   Wt 127 lb 9.6 oz (57.9 kg)   BMI 24.11 kg/m    Physical Exam Vitals and nursing note reviewed.  Constitutional:      Appearance: She is well-developed.  HENT:     Head: Normocephalic and atraumatic.  Eyes:     Conjunctiva/sclera: Conjunctivae normal.  Pulmonary:     Effort: Pulmonary effort is normal.  Abdominal:     General: Bowel sounds are normal.     Palpations: Abdomen is soft.  Musculoskeletal:     Cervical back: Normal range of motion.  Lymphadenopathy:     Cervical: No cervical adenopathy.  Skin:    General: Skin is warm and dry.     Capillary Refill: Capillary refill takes less than 2 seconds.  Neurological:     Mental Status: She is alert and oriented to person, place, and time.  Psychiatric:        Behavior: Behavior normal.      Musculoskeletal Exam: C-spine, thoracic spine, lumbar spine have good range of motion.  Shoulder joints, elbow joints, wrist joints, MCPs, PIPs and DIPs have good range of motion with no synovitis.  She has PIP and DIP thickening consistent with osteoarthritis of both hands.  CMC joint prominence noted.  Hip joints have good range of motion with no discomfort.  Knee joints have good range of motion with no warmth.  Ankle joints have good range of motion with no tenderness or inflammation.  No tenderness of MTP joints.  She has PIP and DIP thickening consistent with osteoarthritis of both feet.  CDAI Exam: CDAI Score: 0.6  Patient Global: 3 mm; Provider Global: 3 mm Swollen: 0 ; Tender: 0  Joint Exam 01/22/2020   No joint exam has been documented for this visit   There is currently no information documented on the homunculus. Go to the Rheumatology activity and complete the homunculus joint  exam.  Investigation: No additional findings.  Imaging: XR Foot 2 Views Left  Result Date: 01/22/2020 PIP and DIP narrowing was noted.  No MTP, intertarsal activity or joint space narrowing was noted.  Posterior calcaneal spur was noted.  No interval change was noted when compared to the x-rays of 2019. Impression: These findings are consistent with osteoarthritis of the foot.  XR Foot 2 Views Right  Result Date: 01/22/2020 PIP and DIP narrowing was noted.  No MTP, intertarsal or tibiotalar joint space narrowing was noted.  Posterior calcaneal spur was noted. No radiographic progression was noted when compared to the x-rays of 07/07/2017. Impression: These findings are consistent with osteoarthritis of the foot.   XR Hand 2 View Left  Result Date: 01/22/2020 Juxta-articular osteopenia was noted.  CMC, PIP and DIP narrowing was noted.  Narrowing of the MCP joints was noted.  Intercarpal and radiocarpal joints space narrowing was noted.  Cystic versus erosive changes were noted in the carpal bones.  No comparison films were  available. Impression: These findings are consistent with rheumatoid arthritis and osteoarthritis overlap.  XR Hand 2 View Right  Result Date: 01/22/2020 Juxta-articular osteopenia was noted.  Narrowing of MCPs, PIPs and DIPs was noted.  CMC narrowing was noted.  Possible cystic versus erosive changes noted in the carpal bone.  No comparison films were available. Impression: These findings are consistent with rheumatoid arthritis and osteoarthritis overlap.   Recent Labs: Lab Results  Component Value Date   WBC 6.6 07/23/2019   HGB 11.8 07/23/2019   PLT 270 07/23/2019   NA 137 07/23/2019   K 4.8 07/23/2019   CL 99 07/23/2019   CO2 28 07/23/2019   GLUCOSE 73 07/23/2019   BUN 15 07/23/2019   CREATININE 0.97 (H) 07/23/2019   BILITOT 0.4 07/23/2019   ALKPHOS 103 02/22/2017   AST 27 07/23/2019   ALT 13 07/23/2019   PROT 6.7 07/23/2019   ALBUMIN 4.1 02/22/2017    CALCIUM 9.5 07/23/2019   GFRAA 66 07/23/2019    Speciality Comments:  PLQ eye exam normal on 09/11/2019 WNL  per [facility]   Procedures:  No procedures performed Allergies: Methotrexate, Contrast media [iodinated diagnostic agents], Penicillins, and Shellfish allergy   Assessment / Plan:     Visit Diagnoses: Rheumatoid arthritis involving multiple sites with positive rheumatoid factor ([place]) - Positive RF, negative CCP: She has no synovitis on exam.  She is clinically doing well on Plaquenil 200 mg 1 tablet in the morning and half tablet in the evening.  She is tolerating Plaquenil without any side effects.  She experiences intermittent pain in both hands and both feet but has not had any joint swelling.  She is also been experiencing intermittent pain in the left knee joint but no warmth or effusion was noted on exam today.  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  She does experience increased arthralgias after physical activity.  X-rays of both hands and feet were obtained today to assess for radiographic progression.  She will continue taking Plaquenil 200 mg 1 tablet morning half tablet in the evening.  She will update CBC and CMP today and once labs result we will send in a refill.  She was advised to notify us if she develops increased joint pain or joint swelling.  She will follow-up in the office in 5 months.- Plan: XR Hand 2 View Right, XR Hand 2 View Left, XR Foot 2 Views Right, XR Foot 2 Views Left  High risk medication use - Plaquenil 200 mg 1 tablet in the morning and 1/2 tablet in the evening. PLQ eye exam normal on 09/11/2019.  CBC and CMP were drawn on 07/23/2019.  She is due to update lab work today.  Orders for CBC and CMP were released.  She requires lab work every 5 months to monitor for drug toxicity.- Plan: CBC with Differential/Platelet, COMPLETE METABOLIC PANEL WITH GFR  Sjogren's syndrome with keratoconjunctivitis sicca ([facility]) - RF 484 and SPEP WNL on  11/22/18.  She continues to have chronic sicca symptoms which have been tolerable with over-the-counter products.  She is using Restasis eyedrops twice daily and tries to drink lots of fluids.  We discussed the use of a humidifier.  We will check CBC, CMP, RF, SPEP, and UA today.  Raynaud's disease without gangrene: She has intermittent symptoms of Raynaud's.  No digital ulcerations or signs of gangrene were noted on examination today.  Her symptoms are less severe in the summer.  Fibromyalgia:  She experiences intermittent myalgias and muscle tenderness due to fibromyalgia.  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  Primary osteoarthritis of both hands: She has PIP and DIP thickening consistent with osteoarthritis of both hands.  No synovitis was noted.  She has complete fist formation bilaterally.  Joint protection and muscle strengthening were discussed.  DDD (degenerative disc disease), lumbar: She is not having any increased lower back pain at this time.  She has no symptoms of radiculopathy.  Chronic SI joint pain: She is not experiencing any SI joint currently.  Osteopenia of multiple sites: She is due to update her DEXA but would like to hold off at this time.  She will notify us when she is ready for [place] to place the order.  She continues to take a calcium supplement.  Paresthesia of both feet: She has been experiencing paresthesias and a burning sensation in both feet.  Her symptoms have become more severe and constant.  She is concerned about the diagnosis of neuropathy.  She has not been evaluated by her PCP or neurologist yet.  Orders: Orders Placed This Encounter  Procedures  . XR Hand 2 View Right  . XR Hand 2 View Left  . XR Foot 2 Views Right  . XR Foot 2 Views Left  . CBC with Differential/Platelet  . COMPLETE METABOLIC PANEL WITH GFR  . Rheumatoid factor  . Serum protein electrophoresis with  reflex  . Urinalysis, Routine w reflex microscopic   No orders of the defined types were placed in this encounter.   Face-to-face time spent with patient was 30  minutes. Greater than 50% of time was spent in counseling and coordination of care.  Follow-Up Instructions: Return in about 5 months (around 06/23/2020) for Rheumatoid arthritis, Fibromyalgia, Sjogren's syndrome.   Ofilia Neas, PA-C  Note - This record has been created using Dragon software.  Chart creation errors have been sought, but may not always  have been located. Such creation errors do not reflect on  the standard of medical care.

## 2020-01-08 NOTE — Telephone Encounter (Signed)
Patient and her daughter Ginger contacted the office regarding patient's PLQ prescription. Patient and her daughter advised we have her PLQ eye exam but she is due to update her labs ASAP. Patient states she has not picked up the prescription that was on file at the pharmacy from 12/17/2019. Patient's daughter states that she spoke with the pharmacy and they state "they do not have a prescription for her. Advised I would contact the pharmacy and follow up on the prescription. Roberta and spoke with the pharmacist. She states they have th prescription on file for the patient and will get it ready for her. Left message to advise patient and her daughter.

## 2020-01-22 ENCOUNTER — Ambulatory Visit: Payer: Self-pay

## 2020-01-22 ENCOUNTER — Other Ambulatory Visit: Payer: Self-pay

## 2020-01-22 ENCOUNTER — Ambulatory Visit: Payer: PPO | Admitting: Physician Assistant

## 2020-01-22 ENCOUNTER — Encounter: Payer: Self-pay | Admitting: Rheumatology

## 2020-01-22 VITALS — BP 154/78 | HR 64 | Resp 16 | Ht 61.0 in | Wt 127.6 lb

## 2020-01-22 DIAGNOSIS — R202 Paresthesia of skin: Secondary | ICD-10-CM | POA: Diagnosis not present

## 2020-01-22 DIAGNOSIS — M19041 Primary osteoarthritis, right hand: Secondary | ICD-10-CM

## 2020-01-22 DIAGNOSIS — M8589 Other specified disorders of bone density and structure, multiple sites: Secondary | ICD-10-CM

## 2020-01-22 DIAGNOSIS — M3501 Sicca syndrome with keratoconjunctivitis: Secondary | ICD-10-CM | POA: Diagnosis not present

## 2020-01-22 DIAGNOSIS — M19042 Primary osteoarthritis, left hand: Secondary | ICD-10-CM

## 2020-01-22 DIAGNOSIS — Z79899 Other long term (current) drug therapy: Secondary | ICD-10-CM

## 2020-01-22 DIAGNOSIS — M797 Fibromyalgia: Secondary | ICD-10-CM | POA: Diagnosis not present

## 2020-01-22 DIAGNOSIS — I7300 Raynaud's syndrome without gangrene: Secondary | ICD-10-CM | POA: Diagnosis not present

## 2020-01-22 DIAGNOSIS — M79671 Pain in right foot: Secondary | ICD-10-CM

## 2020-01-22 DIAGNOSIS — M533 Sacrococcygeal disorders, not elsewhere classified: Secondary | ICD-10-CM

## 2020-01-22 DIAGNOSIS — M5136 Other intervertebral disc degeneration, lumbar region: Secondary | ICD-10-CM | POA: Diagnosis not present

## 2020-01-22 DIAGNOSIS — M79672 Pain in left foot: Secondary | ICD-10-CM

## 2020-01-22 DIAGNOSIS — G8929 Other chronic pain: Secondary | ICD-10-CM

## 2020-01-22 DIAGNOSIS — M0579 Rheumatoid arthritis with rheumatoid factor of multiple sites without organ or systems involvement: Secondary | ICD-10-CM | POA: Diagnosis not present

## 2020-01-22 NOTE — Telephone Encounter (Signed)
Please refill 90 day supply of plaquenil pending lab results, per Hazel Sams, PA-C. Thanks!

## 2020-01-24 LAB — CBC WITH DIFFERENTIAL/PLATELET
Absolute Monocytes: 754 cells/uL (ref 200–950)
Basophils Absolute: 46 cells/uL (ref 0–200)
Basophils Relative: 0.5 %
Eosinophils Absolute: 212 cells/uL (ref 15–500)
Eosinophils Relative: 2.3 %
HCT: 36.8 % (ref 35.0–45.0)
Hemoglobin: 12.7 g/dL (ref 11.7–15.5)
Lymphs Abs: 1822 cells/uL (ref 850–3900)
MCH: 34 pg — ABNORMAL HIGH (ref 27.0–33.0)
MCHC: 34.5 g/dL (ref 32.0–36.0)
MCV: 98.7 fL (ref 80.0–100.0)
MPV: 10.8 fL (ref 7.5–12.5)
Monocytes Relative: 8.2 %
Neutro Abs: 6366 cells/uL (ref 1500–7800)
Neutrophils Relative %: 69.2 %
Platelets: 302 10*3/uL (ref 140–400)
RBC: 3.73 10*6/uL — ABNORMAL LOW (ref 3.80–5.10)
RDW: 12.4 % (ref 11.0–15.0)
Total Lymphocyte: 19.8 %
WBC: 9.2 10*3/uL (ref 3.8–10.8)

## 2020-01-24 LAB — PROTEIN ELECTROPHORESIS, SERUM, WITH REFLEX
Albumin ELP: 4.1 g/dL (ref 3.8–4.8)
Alpha 1: 0.3 g/dL (ref 0.2–0.3)
Alpha 2: 0.8 g/dL (ref 0.5–0.9)
Beta 2: 0.3 g/dL (ref 0.2–0.5)
Beta Globulin: 0.5 g/dL (ref 0.4–0.6)
Gamma Globulin: 0.8 g/dL (ref 0.8–1.7)
Total Protein: 6.7 g/dL (ref 6.1–8.1)

## 2020-01-24 LAB — URINALYSIS, ROUTINE W REFLEX MICROSCOPIC
Bacteria, UA: NONE SEEN /HPF
Bilirubin Urine: NEGATIVE
Glucose, UA: NEGATIVE
Hgb urine dipstick: NEGATIVE
Ketones, ur: NEGATIVE
Leukocytes,Ua: NEGATIVE
Nitrite: NEGATIVE
Specific Gravity, Urine: 1.011 (ref 1.001–1.03)
pH: <=5 (ref 5.0–8.0)

## 2020-01-24 LAB — COMPLETE METABOLIC PANEL WITH GFR
AG Ratio: 1.7 (calc) (ref 1.0–2.5)
ALT: 13 U/L (ref 6–29)
AST: 26 U/L (ref 10–35)
Albumin: 4.3 g/dL (ref 3.6–5.1)
Alkaline phosphatase (APISO): 86 U/L (ref 37–153)
BUN/Creatinine Ratio: 11 (calc) (ref 6–22)
BUN: 13 mg/dL (ref 7–25)
CO2: 24 mmol/L (ref 20–32)
Calcium: 9.6 mg/dL (ref 8.6–10.4)
Chloride: 98 mmol/L (ref 98–110)
Creat: 1.15 mg/dL — ABNORMAL HIGH (ref 0.60–0.93)
GFR, Est African American: 54 mL/min/{1.73_m2} — ABNORMAL LOW (ref >=60)
GFR, Est Non African American: 46 mL/min/{1.73_m2} — ABNORMAL LOW (ref >=60)
Globulin: 2.5 g/dL (calc) (ref 1.9–3.7)
Glucose, Bld: 88 mg/dL (ref 65–99)
Potassium: 4.1 mmol/L (ref 3.5–5.3)
Sodium: 135 mmol/L (ref 135–146)
Total Bilirubin: 0.5 mg/dL (ref 0.2–1.2)
Total Protein: 6.8 g/dL (ref 6.1–8.1)

## 2020-01-24 LAB — RHEUMATOID FACTOR: Rheumatoid fact SerPl-aCnc: 454 IU/mL — ABNORMAL HIGH (ref <14)

## 2020-01-24 MED ORDER — HYDROXYCHLOROQUINE SULFATE 200 MG PO TABS: ORAL_TABLET | ORAL | 0 refills | Status: DC

## 2020-01-24 NOTE — Telephone Encounter (Signed)
CBC/CMP resulted, SPEP pending.  Creatinine 1.15 GFR 46, RBC 3.73,MCH 34.0   Per office note on 01/22/2020: Plaquenil 200 mg 1 tablet in the morning and 1/2 tablet in the evening

## 2020-02-21 ENCOUNTER — Other Ambulatory Visit: Payer: Self-pay | Admitting: Specialist

## 2020-02-21 NOTE — Telephone Encounter (Signed)
Pls adivse

## 2020-03-11 DIAGNOSIS — E039 Hypothyroidism, unspecified: Secondary | ICD-10-CM | POA: Diagnosis not present

## 2020-03-11 DIAGNOSIS — R030 Elevated blood-pressure reading, without diagnosis of hypertension: Secondary | ICD-10-CM | POA: Diagnosis not present

## 2020-04-17 DIAGNOSIS — Z23 Encounter for immunization: Secondary | ICD-10-CM | POA: Diagnosis not present

## 2020-04-17 DIAGNOSIS — R030 Elevated blood-pressure reading, without diagnosis of hypertension: Secondary | ICD-10-CM | POA: Diagnosis not present

## 2020-06-08 ENCOUNTER — Other Ambulatory Visit: Payer: Self-pay | Admitting: Specialist

## 2020-07-04 HISTORY — PX: OTHER SURGICAL HISTORY: SHX169

## 2020-07-13 ENCOUNTER — Other Ambulatory Visit: Payer: Self-pay | Admitting: Physician Assistant

## 2020-07-13 DIAGNOSIS — Z79899 Other long term (current) drug therapy: Secondary | ICD-10-CM

## 2020-07-13 DIAGNOSIS — M0579 Rheumatoid arthritis with rheumatoid factor of multiple sites without organ or systems involvement: Secondary | ICD-10-CM

## 2020-07-13 NOTE — Telephone Encounter (Signed)
Please schedule appt, Return in about 5 months (around 06/23/2020) for Rheumatoid arthritis, Fibromyalgia, Sjogren's syndrome, PLQ eye exam due 09/10/2020. Thank you.

## 2020-07-13 NOTE — Telephone Encounter (Signed)
Last Visit: 01/22/2020 Next Visit: Message sent to front desk to schedule appt, Return in about 5 months (around 06/23/2020) for Rheumatoid arthritis, Fibromyalgia, Sjogren's syndrome. PLQ eye exam due 09/10/2020 Labs:  01/22/2020, RBC 3.73, MCH 34.0, Creat 1.15, GFR 46, patient called and advised to update labs. Eye exam: 09/11/2019 WNL  Current Dose per office note 01/22/2020, Plaquenil 200 mg 1 tablet in the morning and 1/2 tablet in the evening DX: Rheumatoid arthritis involving multiple sites with positive rheumatoid factor   Okay to refill Plaquenil?

## 2020-07-13 NOTE — Telephone Encounter (Signed)
Patient is overdue to update lab work. Ok to refill 30-day supply of PLQ.

## 2020-07-16 NOTE — Progress Notes (Signed)
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 female with history of seropositive rheumatoid arthritis, Sjogren's syndrome, fibromyalgia, and osteoarthritis.  Patient is taking Plaquenil 200 mg 1 tablet in the morning and half tablet in the evening.  She is tolerating Plaquenil without any side effects.  She denies any recent rheumatoid arthritis flares.  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  She states that she experiences increased arthralgias and myalgias in the evenings after strenuous activities throughout the day.  She states that she notices occasional swelling in her left knee joint which typically resolves after using ice and Aspercreme topically.  She continues to experience intermittent fibromyalgia flares but overall her fatigue has been stable recently.  She has been sleeping well at night taking Ambien 10 mg by mouth at bedtime. Patient reports that her symptoms of neuropathy in her feet have progressively been worsening.  She states that she has had several falls in the past several months due to the instability in her feet.  She has been following up with her PCP but has not established with a neurologist. She continues to have sicca symptoms.  She has been using Restasis twice daily for symptomatic relief.  She is not following along closely with her dentist and has been drinking fluids throughout the day to help with her mouth dryness.    Activities of Daily Living:  Patient reports morning stiffness for 20 minutes.   Patient Reports nocturnal pain.  Difficulty dressing/grooming: Denies Difficulty climbing stairs: Reports Difficulty getting out of chair:  Reports Difficulty using hands for taps, buttons, cutlery, and/or writing: Reports  Review of Systems  Constitutional: Positive for fatigue.  HENT: Positive for mouth dryness. Negative for mouth sores and nose dryness.   Eyes: Positive for dryness. Negative for pain and visual disturbance.  Respiratory: Negative for cough, hemoptysis, shortness of breath and difficulty breathing.   Cardiovascular: Positive for swelling in legs/feet. Negative for chest pain, palpitations and hypertension.  Gastrointestinal: Positive for constipation. Negative for blood in stool and diarrhea.  Endocrine: Negative for increased urination.  Genitourinary: Negative for difficulty urinating and painful urination.  Musculoskeletal: Positive for arthralgias, gait problem, joint pain, joint swelling, muscle weakness, morning stiffness and muscle tenderness. Negative for myalgias and myalgias.  Skin: Negative for color change, pallor, rash, hair loss, nodules/bumps, skin tightness, ulcers and sensitivity to sunlight.  Allergic/Immunologic: Negative for susceptible to infections.  Neurological: Positive for numbness. Negative for dizziness, headaches and weakness.  Hematological: Positive for bruising/bleeding tendency. Negative for swollen glands.  Psychiatric/Behavioral: Positive for sleep disturbance. Negative for depressed mood. The patient is not nervous/anxious.     PMFS History:  Patient Active Problem List   Diagnosis Date Noted  . Rheumatoid arthritis involving multiple sites with positive rheumatoid factor ([place]) 07/14/2016  . High risk medication use 07/14/2016  . Sjogren's syndrome with keratoconjunctivitis sicca ([place]) 07/14/2016  . Raynaud's disease without gangrene 07/14/2016  . Achilles tendinitis of both lower extremities 07/14/2016  . Fibromyalgia 07/14/2016  . Anxiety and depression  07/14/2016  . History of asthma 07/14/2016  . Environmental allergies 07/14/2016  . Gastroesophageal reflux  disease without esophagitis 07/14/2016  . Neuropathy 07/14/2016  . Osteopenia of multiple sites 07/14/2016  . Pain in both hands 07/14/2016  . Pain in both feet 07/14/2016    Past Medical History:  Diagnosis Date  . Back pain   . Fibromyalgia   . Osteoarthritis   . Rheumatoid arteritis ([place])   . Ruptured lumbar disc   . Sjogren's disease ([place])     Family History  Problem Relation Age of Onset  . Heart failure Mother   . Heart failure Father   . Healthy Daughter   . Healthy Daughter   . Healthy Son    Past Surgical History:  Procedure Laterality Date  . ABDOMINAL HYSTERECTOMY    . BLADDER SURGERY     Social History   Social History Narrative  . Not on file   Immunization History  Administered Date(s) Administered  . Influenza, High Dose Seasonal PF 04/06/2017  . Zoster Recombinat (Shingrix) 01/31/2018     Objective: Vital Signs: BP (!) 179/64 (BP Location: Left Arm, Patient Position: Sitting, Cuff Size: Normal)   Pulse 64   Resp 17   Ht 5\' 1"  (1.549 m)   Wt 127 lb (57.6 kg)   BMI 24.00 kg/m    Physical Exam Vitals and nursing note reviewed.  Constitutional:      Appearance: She is well-developed and well-nourished.  HENT:     Head: Normocephalic and atraumatic.  Eyes:     Extraocular Movements: EOM normal.     Conjunctiva/sclera: Conjunctivae normal.  Cardiovascular:     Pulses: Intact distal pulses.  Pulmonary:     Effort: Pulmonary effort is normal.  Abdominal:     Palpations: Abdomen is soft.  Musculoskeletal:     Cervical back: Normal range of motion.  Skin:    General: Skin is warm and dry.     Capillary Refill: Capillary refill takes less than 2 seconds.  Neurological:     Mental Status: She is alert and oriented to person, place, and time.  Psychiatric:        Mood and Affect: Mood and affect normal.        Behavior: Behavior normal.      Musculoskeletal Exam: C-spine, thoracic spine, and lumbar spine good ROM. Shoulder joints, elbow  joints, wrist joints, MCPs, PIPs, and DIPs good ROM with no synovitis.  PIP and DIP thickening consistent with osteoarthritis of both hands. Complete fist formation bilaterally.  Left hip has limited ROM with discomfort. Right hip has good ROM with no discomfort. Knee joints and ankle joints good ROM with no discomfort.  No warmth or effusion of knee joints.   No tenderness or swelling of ankle joints.   CDAI Exam: CDAI Score: 1  Patient Global: 6 mm; Provider Global: 4 mm Swollen: 0 ; Tender: 0  Joint Exam 07/30/2020   No joint exam has been documented for this visit   There is currently no information documented on the homunculus. Go to the Rheumatology activity and complete the homunculus joint exam.  Investigation: No additional findings.  Imaging: No results found.  Recent Labs: Lab Results  Component Value Date   WBC 9.2 01/22/2020   HGB 12.7 01/22/2020   PLT 302 01/22/2020   NA 135 01/22/2020   K 4.1 01/22/2020   CL 98 01/22/2020   CO2 24 01/22/2020   GLUCOSE 88 01/22/2020   BUN 13 01/22/2020  CREATININE 1.15 (H) 01/22/2020   BILITOT 0.5 01/22/2020   ALKPHOS 103 02/22/2017   AST 26 01/22/2020   ALT 13 01/22/2020   PROT 6.8 01/22/2020   PROT 6.7 01/22/2020   ALBUMIN 4.1 02/22/2017   CALCIUM 9.6 01/22/2020   GFRAA 54 (L) 01/22/2020    Speciality Comments:  PLQ eye exam normal on 09/11/2019 WNL  per [facility]   Procedures:  No procedures performed Allergies: Methotrexate, Contrast media [iodinated diagnostic agents], Penicillins, and Shellfish allergy   Assessment / Plan:     Visit Diagnoses: Rheumatoid arthritis involving multiple sites with positive rheumatoid factor (HCC) - Positive RF, negative CCP: She has no joint tenderness or synovitis on exam.  She is clinically doing well on Plaquenil 200 mg 1 tablet morning half tablet in the evening.  She is tolerating Plaquenil and has not missed any doses recently. 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 She experiences intermittent pain and swelling in the left knee joint which is typically worse in the evening after working on her farm during the day. She has good range of motion of the left knee joint on examination today with no warmth or effusion. She takes Tylenol as needed and uses Aspercreme topically for pain relief. She continues to have chronic pain in both feet due to symptoms of neuropathy. She has been following up with her PCP for management of neuropathy but has not been evaluated by a neurologist. She has noticed some increased falls due to the instability in bilateral lower extremities. Recommended a referral to neurology but she declined at this time. She will continue taking Plaquenil 200 mg 1 tablet in the morning and half tablet in the evening as prescribed. A refill was sent to the pharmacy. She was advised to notify us if she develops increased joint pain or joint swelling. She will follow-up in the office in 5 months. Plan: hydroxychloroquine (PLAQUENIL) 200 MG tablet  High risk medication use - Plaquenil 200 mg 1 tablet in the morning and 1/2 tablet in the evening. PLQ eye exam normal on 09/11/2019. CBC and CMP were drawn on 01/22/2020.  She is due to update CBC and CMP today.  Orders were released.  PLQ eye exam normal on 09/11/2019 WNL per [facility].  She was given a Plaquenil eye exam to take with her to her upcoming appointment. She has had 3 pfizer covid-19 vaccine doses.  She has not had any recent infections.  - Plan: CBC with Differential/Platelet, COMPLETE METABOLIC PANEL WITH GFR, Urinalysis, Routine w reflex microscopic, hydroxychloroquine (PLAQUENIL) 200 MG tablet  Sjogren's syndrome with keratoconjunctivitis sicca (HCC) -  Lab work from 01/22/2020 was reviewed today in the office: SPEP did not reveal any monoclonal proteins and rheumatoid factor remains positive. She has ongoing sicca symptoms. She  has been using Restasis eyedrops twice daily. She has been drinking fluids throughout the day and been following along with her dentist closely. She will continue taking Plaquenil as prescribed. We will check CBC, CMP, and UA today. She was advised to notify us if she develops any new or worsening symptoms.   - Plan: CBC with Differential/Platelet, COMPLETE METABOLIC PANEL WITH GFR, Urinalysis, Routine w reflex microscopic  Raynaud's disease without gangrene: Not currently active. No digital ulcerations or signs of gangrene were noted on exam. No skin tightness or thickening noted.  Fibromyalgia: She has occasional myalgias and muscle tenderness due to ongoing fibromyalgia.  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  Her level of fatigue has been stable recently.  She has been sleeping well at night taking Ambien 10 mg by mouth at bedtime for insomnia.  We discussed the importance of regular exercise and good sleep hygiene.  Primary osteoarthritis of both hands: She has PIP and DIP thickening consistent with osteoarthritis of both hands.  No joint tenderness or inflammation was noted.  She is able to make a complete fist bilaterally.  Joint protection and muscle strengthening were discussed.  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   Chronic SI joint pain: She has occasional discomfort and stiffness in her lower back.  Osteopenia of multiple sites: She takes a calcium supplement. No recent DEXA in epic.  Paresthesia of both feet: She has been experiencing worsening symptoms of paresthesias in both feet due to underlying neuropathy. Her PCP has been managing her symptoms. We discussed a referral to neurology since she has been having more frequent falls but she declined at this time. Discussed the  importance of wearing proper fitting shoes.    Orders: Orders Placed This Encounter  Procedures  . CBC with Differential/Platelet  . COMPLETE METABOLIC PANEL WITH GFR  . Urinalysis, Routine w reflex microscopic   Meds ordered this encounter  Medications  . hydroxychloroquine (PLAQUENIL) 200 MG tablet    Sig: Take 200 mg 1 tablet in the morning and 1/2 tablet in the evening    Dispense:  135 tablet    Refill:  0      Follow-Up Instructions: Return in about 5 months (around 12/28/2020) for Rheumatoid arthritis, Sjogren's syndrome, Fibromyalgia, Osteoarthritis.   Ofilia Neas, PA-C  Note - This record has been created using Dragon software.  Chart creation errors have been sought, but may not always  have been located. Such creation errors do not reflect on  the standard of medical care.

## 2020-07-17 ENCOUNTER — Telehealth: Payer: Self-pay

## 2020-07-17 NOTE — Telephone Encounter (Signed)
Patient called stating she had planned to come in the office today for labwork, but due to no lab technician will plan to get labwork at her appointment on 07/30/20.

## 2020-07-17 NOTE — Telephone Encounter (Signed)
Noted  

## 2020-07-30 ENCOUNTER — Other Ambulatory Visit: Payer: Self-pay | Admitting: Specialist

## 2020-07-30 ENCOUNTER — Other Ambulatory Visit: Payer: Self-pay

## 2020-07-30 ENCOUNTER — Ambulatory Visit: Payer: PPO | Admitting: Physician Assistant

## 2020-07-30 ENCOUNTER — Encounter: Payer: Self-pay | Admitting: Physician Assistant

## 2020-07-30 VITALS — BP 179/64 | HR 64 | Resp 17 | Ht 61.0 in | Wt 127.0 lb

## 2020-07-30 DIAGNOSIS — M19042 Primary osteoarthritis, left hand: Secondary | ICD-10-CM | POA: Diagnosis not present

## 2020-07-30 DIAGNOSIS — M3501 Sicca syndrome with keratoconjunctivitis: Secondary | ICD-10-CM | POA: Diagnosis not present

## 2020-07-30 DIAGNOSIS — R202 Paresthesia of skin: Secondary | ICD-10-CM | POA: Diagnosis not present

## 2020-07-30 DIAGNOSIS — M5136 Other intervertebral disc degeneration, lumbar region: Secondary | ICD-10-CM | POA: Diagnosis not present

## 2020-07-30 DIAGNOSIS — Z79899 Other long term (current) drug therapy: Secondary | ICD-10-CM

## 2020-07-30 DIAGNOSIS — I7300 Raynaud's syndrome without gangrene: Secondary | ICD-10-CM | POA: Diagnosis not present

## 2020-07-30 DIAGNOSIS — G8929 Other chronic pain: Secondary | ICD-10-CM | POA: Diagnosis not present

## 2020-07-30 DIAGNOSIS — M8589 Other specified disorders of bone density and structure, multiple sites: Secondary | ICD-10-CM

## 2020-07-30 DIAGNOSIS — M533 Sacrococcygeal disorders, not elsewhere classified: Secondary | ICD-10-CM | POA: Diagnosis not present

## 2020-07-30 DIAGNOSIS — M797 Fibromyalgia: Secondary | ICD-10-CM | POA: Diagnosis not present

## 2020-07-30 DIAGNOSIS — M19041 Primary osteoarthritis, right hand: Secondary | ICD-10-CM

## 2020-07-30 DIAGNOSIS — M0579 Rheumatoid arthritis with rheumatoid factor of multiple sites without organ or systems involvement: Secondary | ICD-10-CM | POA: Diagnosis not present

## 2020-07-30 MED ORDER — TRAMADOL HCL 50 MG PO TABS: 50.0000 mg | ORAL_TABLET | Freq: Four times a day (QID) | ORAL | 0 refills | Status: DC | PRN

## 2020-07-30 MED ORDER — HYDROXYCHLOROQUINE SULFATE 200 MG PO TABS: ORAL_TABLET | ORAL | 0 refills | Status: DC

## 2020-07-30 NOTE — Telephone Encounter (Signed)
Patient came. Would like a refill on Tramadol sent to [facility] on [address]. Her call back number is [phone]

## 2020-07-31 LAB — CBC WITH DIFFERENTIAL/PLATELET
Absolute Monocytes: 555 cells/uL (ref 200–950)
Basophils Absolute: 52 cells/uL (ref 0–200)
Basophils Relative: 0.7 %
Eosinophils Absolute: 207 cells/uL (ref 15–500)
Eosinophils Relative: 2.8 %
HCT: 34.8 % — ABNORMAL LOW (ref 35.0–45.0)
Hemoglobin: 11.8 g/dL (ref 11.7–15.5)
Lymphs Abs: 1421 cells/uL (ref 850–3900)
MCH: 33.5 pg — ABNORMAL HIGH (ref 27.0–33.0)
MCHC: 33.9 g/dL (ref 32.0–36.0)
MCV: 98.9 fL (ref 80.0–100.0)
MPV: 10.6 fL (ref 7.5–12.5)
Monocytes Relative: 7.5 %
Neutro Abs: 5165 cells/uL (ref 1500–7800)
Neutrophils Relative %: 69.8 %
Platelets: 282 10*3/uL (ref 140–400)
RBC: 3.52 10*6/uL — ABNORMAL LOW (ref 3.80–5.10)
RDW: 12.3 % (ref 11.0–15.0)
Total Lymphocyte: 19.2 %
WBC: 7.4 10*3/uL (ref 3.8–10.8)

## 2020-07-31 LAB — URINALYSIS, ROUTINE W REFLEX MICROSCOPIC
Bilirubin Urine: NEGATIVE
Glucose, UA: NEGATIVE
Hgb urine dipstick: NEGATIVE
Ketones, ur: NEGATIVE
Leukocytes,Ua: NEGATIVE
Nitrite: NEGATIVE
Protein, ur: NEGATIVE
Specific Gravity, Urine: 1.007 (ref 1.001–1.03)
pH: 5.5 (ref 5.0–8.0)

## 2020-07-31 LAB — COMPLETE METABOLIC PANEL WITH GFR
AG Ratio: 1.7 (calc) (ref 1.0–2.5)
ALT: 12 U/L (ref 6–29)
AST: 25 U/L (ref 10–35)
Albumin: 4 g/dL (ref 3.6–5.1)
Alkaline phosphatase (APISO): 79 U/L (ref 37–153)
BUN: 15 mg/dL (ref 7–25)
CO2: 31 mmol/L (ref 20–32)
Calcium: 9.3 mg/dL (ref 8.6–10.4)
Chloride: 99 mmol/L (ref 98–110)
Creat: 0.84 mg/dL (ref 0.60–0.93)
GFR, Est African American: 78 mL/min/{1.73_m2} (ref >=60)
GFR, Est Non African American: 67 mL/min/{1.73_m2} (ref >=60)
Globulin: 2.4 g/dL (calc) (ref 1.9–3.7)
Glucose, Bld: 104 mg/dL — ABNORMAL HIGH (ref 65–99)
Potassium: 4.7 mmol/L (ref 3.5–5.3)
Sodium: 135 mmol/L (ref 135–146)
Total Bilirubin: 0.4 mg/dL (ref 0.2–1.2)
Total Protein: 6.4 g/dL (ref 6.1–8.1)

## 2020-07-31 NOTE — Progress Notes (Signed)
RBC count and hematocrit are borderline low.  We will continue to monitor.  CMP WNL.  UA normal.

## 2020-08-31 ENCOUNTER — Other Ambulatory Visit: Payer: Self-pay | Admitting: Specialist

## 2020-09-04 DIAGNOSIS — L821 Other seborrheic keratosis: Secondary | ICD-10-CM | POA: Diagnosis not present

## 2020-09-04 DIAGNOSIS — L570 Actinic keratosis: Secondary | ICD-10-CM | POA: Diagnosis not present

## 2020-09-04 DIAGNOSIS — L814 Other melanin hyperpigmentation: Secondary | ICD-10-CM | POA: Diagnosis not present

## 2020-09-25 ENCOUNTER — Other Ambulatory Visit: Payer: Self-pay | Admitting: Physician Assistant

## 2020-09-25 DIAGNOSIS — M0579 Rheumatoid arthritis with rheumatoid factor of multiple sites without organ or systems involvement: Secondary | ICD-10-CM

## 2020-09-25 DIAGNOSIS — Z79899 Other long term (current) drug therapy: Secondary | ICD-10-CM

## 2020-09-25 NOTE — Telephone Encounter (Signed)
Last Visit: 07/30/2020,  Next Visit:  message sent to front desk to schedule appt, Return in about 5 months (around 12/28/2020) for Rheumatoid arthritis, Sjogren's syndrome, Fibromyalgia, Osteoarthritis.  Labs: 07/30/2020, RBC count and hematocrit are borderline low. We will continue to monitor. CMP WNL. UA normal.  Eye exam: 09/11/2019, I called patient PLQ eye exam due.  Current Dose per office note 07/30/2020, Plaquenil 200 mg 1 tablet in the morning and 1/2 tablet in the evening  DX: Rheumatoid arthritis involving multiple sites with positive rheumatoid factor   Last Fill: 07/30/2020  Okay to refill Plaquenil?

## 2020-09-25 NOTE — Telephone Encounter (Signed)
Please call patient to schedule appt. Thank you.  Return in about 5 months (around 12/28/2020) for Rheumatoid arthritis, Sjogren's syndrome, Fibromyalgia, Osteoarthritis.

## 2020-09-28 ENCOUNTER — Other Ambulatory Visit: Payer: Self-pay | Admitting: Physician Assistant

## 2020-09-28 DIAGNOSIS — Z79899 Other long term (current) drug therapy: Secondary | ICD-10-CM

## 2020-09-28 DIAGNOSIS — M0579 Rheumatoid arthritis with rheumatoid factor of multiple sites without organ or systems involvement: Secondary | ICD-10-CM

## 2020-10-07 DIAGNOSIS — E039 Hypothyroidism, unspecified: Secondary | ICD-10-CM | POA: Diagnosis not present

## 2020-10-07 DIAGNOSIS — K219 Gastro-esophageal reflux disease without esophagitis: Secondary | ICD-10-CM | POA: Diagnosis not present

## 2020-10-07 DIAGNOSIS — Z0000 Encounter for general adult medical examination without abnormal findings: Secondary | ICD-10-CM | POA: Diagnosis not present

## 2020-10-07 DIAGNOSIS — F5104 Psychophysiologic insomnia: Secondary | ICD-10-CM | POA: Diagnosis not present

## 2020-10-07 DIAGNOSIS — J3089 Other allergic rhinitis: Secondary | ICD-10-CM | POA: Diagnosis not present

## 2020-10-07 DIAGNOSIS — F419 Anxiety disorder, unspecified: Secondary | ICD-10-CM | POA: Diagnosis not present

## 2020-10-08 ENCOUNTER — Other Ambulatory Visit: Payer: Self-pay | Admitting: Specialist

## 2020-10-08 MED ORDER — TRAMADOL HCL 50 MG PO TABS: 50.0000 mg | ORAL_TABLET | Freq: Four times a day (QID) | ORAL | 0 refills | Status: DC | PRN

## 2020-10-08 NOTE — Addendum Note (Signed)
Addended by: Minda Ditto, Alyse Low N on: 10/08/2020 01:54 PM   Modules accepted: Orders

## 2020-10-08 NOTE — Telephone Encounter (Signed)
Pt called and was wondering if she could get a refill on her tramadol.

## 2020-11-07 ENCOUNTER — Other Ambulatory Visit: Payer: Self-pay | Admitting: Specialist

## 2020-12-09 NOTE — Progress Notes (Signed)
Office Visit Note  Patient: Brenda Hanna             Date of Birth: 01/18/1941           MRN: [id number]             PCP: Myrlene Broker, MD Referring: Myrlene Broker, MD Visit Date: 12/23/2020 Occupation: @GUAROCC @  Subjective:  Other (Patient reports increased falls due to neuropathy. )   History of Present Illness: Brenda Hanna is a 80 y.o. female with a history of rheumatoid arthritis and osteoarthritis.  She continues to have pain and discomfort in her hands, knees and her feet.  She states due to neuropathy in her feet she is having difficulty walking and instability.  She states she has fallen few times due to neuropathy.  She continues to have dry mouth and dry eyes.  She has been using Restasis eyedrops.  She continues to have some lower back pain due to underlying disc disease.  She takes Ultram on as needed basis.  She also takes Ambien at nighttime.  Activities of Daily Living:  Patient reports morning stiffness for 15 minutes.   Patient Denies nocturnal pain.  Difficulty dressing/grooming: Denies Difficulty climbing stairs: Denies Difficulty getting out of chair: Reports Difficulty using hands for taps, buttons, cutlery, and/or writing: Reports  Review of Systems  Constitutional:  Negative for fatigue.  HENT:  Positive for mouth dryness and nose dryness. Negative for mouth sores.   Eyes:  Positive for dryness. Negative for pain and itching.  Respiratory:  Negative for shortness of breath and difficulty breathing.   Cardiovascular:  Negative for chest pain and palpitations.  Gastrointestinal:  Positive for constipation. Negative for blood in stool and diarrhea.  Endocrine: Negative for increased urination.  Genitourinary:  Negative for difficulty urinating.  Musculoskeletal:  Positive for joint pain, joint pain, joint swelling and morning stiffness. Negative for myalgias, muscle tenderness and myalgias.  Skin:  Negative for color change, rash and redness.   Allergic/Immunologic: Negative for susceptible to infections.  Neurological:  Positive for numbness and parasthesias. Negative for dizziness, headaches, memory loss and weakness.  Hematological:  Positive for bruising/bleeding tendency.  Psychiatric/Behavioral:  Positive for sleep disturbance. Negative for confusion.    PMFS History:  Patient Active Problem List   Diagnosis Date Noted   Rheumatoid arthritis involving multiple sites with positive rheumatoid factor ([place]) 07/14/2016   High risk medication use 07/14/2016   Sjogren's syndrome with keratoconjunctivitis sicca ([place]) 07/14/2016   Raynaud's disease without gangrene 07/14/2016   Achilles tendinitis of both lower extremities 07/14/2016   Fibromyalgia 07/14/2016   Anxiety and depression 07/14/2016   History of asthma 07/14/2016   Environmental allergies 07/14/2016   Gastroesophageal reflux disease without esophagitis 07/14/2016   Neuropathy 07/14/2016   Osteopenia of multiple sites 07/14/2016   Pain in both hands 07/14/2016   Pain in both feet 07/14/2016    Past Medical History:  Diagnosis Date   Back pain    Fibromyalgia    Osteoarthritis    Rheumatoid arteritis ([place])    Ruptured lumbar disc    Sjogren's disease ([place])     Family History  Problem Relation Age of Onset   Heart failure Mother    Heart failure Father    Healthy Daughter    Healthy Daughter    Healthy Son    Past Surgical History:  Procedure Laterality Date   ABDOMINAL HYSTERECTOMY     BLADDER SURGERY     dental  implants  2022   Social History   Social History Narrative   Not on file   Immunization History  Administered Date(s) Administered   Influenza, High Dose Seasonal PF 04/06/2017   Zoster Recombinat (Shingrix) 01/31/2018     Objective: Vital Signs: BP (!) 165/69 (BP Location: Right Arm, Patient Position: Sitting, Cuff Size: Normal)   Pulse 63   Resp 14   Ht 5\' 1"  (1.549 m)   Wt 121 lb 9.6 oz (55.2 kg)   BMI 22.98 kg/m     Physical Exam Vitals and nursing note reviewed.  Constitutional:      Appearance: She is well-developed.  HENT:     Head: Normocephalic and atraumatic.  Eyes:     Conjunctiva/sclera: Conjunctivae normal.  Cardiovascular:     Rate and Rhythm: Normal rate and regular rhythm.     Heart sounds: Normal heart sounds.  Pulmonary:     Effort: Pulmonary effort is normal.     Breath sounds: Normal breath sounds.  Abdominal:     General: Bowel sounds are normal.     Palpations: Abdomen is soft.  Musculoskeletal:     Cervical back: Normal range of motion.  Lymphadenopathy:     Cervical: No cervical adenopathy.  Skin:    General: Skin is warm and dry.     Capillary Refill: Capillary refill takes less than 2 seconds.  Neurological:     Mental Status: She is alert and oriented to person, place, and time.  Psychiatric:        Behavior: Behavior normal.     Musculoskeletal Exam: C-spine was in good range of motion.  She had limited painful range of motion of the lumbar spine.  Shoulder joints, elbow joints, wrist joints with good range of motion.  She had bilateral PIP and DIP thickening.  No synovitis was noted.  Hip joints and knee joints with good range of motion.  She had no tenderness over ankles or MTPs.  CDAI Exam: CDAI Score: 0.6  Patient Global: 4 mm; Provider Global: 2 mm Swollen: 0 ; Tender: 0  Joint Exam 12/23/2020   No joint exam has been documented for this visit   There is currently no information documented on the homunculus. Go to the Rheumatology activity and complete the homunculus joint exam.  Investigation: No additional findings.  Imaging: No results found.  Recent Labs: Lab Results  Component Value Date   WBC 7.4 07/30/2020   HGB 11.8 07/30/2020   PLT 282 07/30/2020   NA 135 07/30/2020   K 4.7 07/30/2020   CL 99 07/30/2020   CO2 31 07/30/2020   GLUCOSE 104 (H) 07/30/2020   BUN 15 07/30/2020   CREATININE 0.84 07/30/2020   BILITOT 0.4 07/30/2020    ALKPHOS 103 02/22/2017   AST 25 07/30/2020   ALT 12 07/30/2020   PROT 6.4 07/30/2020   ALBUMIN 4.1 02/22/2017   CALCIUM 9.3 07/30/2020   GFRAA 78 07/30/2020    Speciality Comments:  PLQ eye exam normal on 09/11/2019 WNL  per [facility]   Procedures:  No procedures performed Allergies: Methotrexate, Contrast media [iodinated diagnostic agents], Penicillins, and Shellfish allergy   Assessment / Plan:     Visit Diagnoses: Rheumatoid arthritis involving multiple sites with positive rheumatoid factor ([place]) - Positive RF, negative CCP:  -Patient had no synovitis on my examination.  She complains of increased pain and discomfort in multiple joints.  I believe most of the discomfort is coming from underlying osteoarthritis.  Plan:  Sedimentation rate, Rheumatoid factor  High risk medication use - Plaquenil 200 mg 1 tablet in the morning and 1/2 tablet in the evening. PLQ eye exam normal on 09/11/2019. - Plan: CBC with Differential/Platelet, COMPLETE METABOLIC PANEL WITH GFR today and then every 5 months.  She has been advised to get eye examination as soon as possible.  Sjogren's syndrome with keratoconjunctivitis sicca (HCC) - +ANA, +Ro,+RF , sicca -she continues to have sicca symptoms.  She has been using Restasis.  Over-the-counter products were discussed.  Plan: Serum protein electrophoresis with reflex  Primary osteoarthritis of both hands-she has bilateral PIP and DIP thickening.  Joint protection muscle strengthening was discussed.  No synovitis was noted.  DDD (degenerative disc disease), lumbar-she is followed by Dr. Louanne Skye.  Chronic SI joint pain  Raynaud's disease without gangrene-currently not symptomatic.  Fibromyalgia she continues to have some generalized pain and discomfort from fibromyalgia.  Osteopenia of multiple sites - followed by her PCP.  Paresthesia of both feet-she complains of paresthesias in her bilateral feet.  She states she has been falling frequently due to  numbness in her feet.  I will refer her to neurology for evaluation.  Frequent falls-I will refer her to physical therapy for lower extremity muscle strengthening and fall prevention.  Orders: Orders Placed This Encounter  Procedures   CBC with Differential/Platelet   COMPLETE METABOLIC PANEL WITH GFR   Sedimentation rate   Rheumatoid factor   Serum protein electrophoresis with reflex   Ambulatory referral to Neurology   Ambulatory referral to Physical Therapy    No orders of the defined types were placed in this encounter.    Follow-Up Instructions: Return in about 5 months (around 05/25/2021) for Rheumatoid arthritis.   Bo Merino, MD  Note - This record has been created using Editor, commissioning.  Chart creation errors have been sought, but may not always  have been located. Such creation errors do not reflect on  the standard of medical care.

## 2020-12-23 ENCOUNTER — Other Ambulatory Visit: Payer: Self-pay

## 2020-12-23 ENCOUNTER — Encounter: Payer: Self-pay | Admitting: Neurology

## 2020-12-23 ENCOUNTER — Encounter: Payer: Self-pay | Admitting: Rheumatology

## 2020-12-23 ENCOUNTER — Ambulatory Visit: Payer: PPO | Admitting: Rheumatology

## 2020-12-23 VITALS — BP 165/69 | HR 63 | Resp 14 | Ht 61.0 in | Wt 121.6 lb

## 2020-12-23 DIAGNOSIS — R296 Repeated falls: Secondary | ICD-10-CM | POA: Diagnosis not present

## 2020-12-23 DIAGNOSIS — M533 Sacrococcygeal disorders, not elsewhere classified: Secondary | ICD-10-CM

## 2020-12-23 DIAGNOSIS — M5136 Other intervertebral disc degeneration, lumbar region: Secondary | ICD-10-CM | POA: Diagnosis not present

## 2020-12-23 DIAGNOSIS — M19042 Primary osteoarthritis, left hand: Secondary | ICD-10-CM

## 2020-12-23 DIAGNOSIS — M0579 Rheumatoid arthritis with rheumatoid factor of multiple sites without organ or systems involvement: Secondary | ICD-10-CM

## 2020-12-23 DIAGNOSIS — M19041 Primary osteoarthritis, right hand: Secondary | ICD-10-CM

## 2020-12-23 DIAGNOSIS — R202 Paresthesia of skin: Secondary | ICD-10-CM | POA: Diagnosis not present

## 2020-12-23 DIAGNOSIS — M3501 Sicca syndrome with keratoconjunctivitis: Secondary | ICD-10-CM

## 2020-12-23 DIAGNOSIS — Z79899 Other long term (current) drug therapy: Secondary | ICD-10-CM | POA: Diagnosis not present

## 2020-12-23 DIAGNOSIS — G8929 Other chronic pain: Secondary | ICD-10-CM

## 2020-12-23 DIAGNOSIS — M797 Fibromyalgia: Secondary | ICD-10-CM | POA: Diagnosis not present

## 2020-12-23 DIAGNOSIS — I7300 Raynaud's syndrome without gangrene: Secondary | ICD-10-CM

## 2020-12-23 DIAGNOSIS — M8589 Other specified disorders of bone density and structure, multiple sites: Secondary | ICD-10-CM

## 2020-12-23 NOTE — Patient Instructions (Signed)
Vaccines You are taking a medication(s) that can suppress your immune system.  The following immunizations are recommended: Flu annually Covid-19  Td/Tdap (tetanus, diphtheria, pertussis) every 10 years Pneumonia (Prevnar 15 then Pneumovax 23 at least 1 year apart.  Alternatively, can take Prevnar 20 without needing additional dose) Shingrix (after age 78): 2 doses from 4 weeks to 6 months apart  Please check with your PCP to make sure you are up to date.

## 2020-12-25 LAB — CBC WITH DIFFERENTIAL/PLATELET
Absolute Monocytes: 566 cells/uL (ref 200–950)
Basophils Absolute: 41 cells/uL (ref 0–200)
Basophils Relative: 0.6 %
Eosinophils Absolute: 269 cells/uL (ref 15–500)
Eosinophils Relative: 3.9 %
HCT: 33.6 % — ABNORMAL LOW (ref 35.0–45.0)
Hemoglobin: 11.1 g/dL — ABNORMAL LOW (ref 11.7–15.5)
Lymphs Abs: 2049 cells/uL (ref 850–3900)
MCH: 32.7 pg (ref 27.0–33.0)
MCHC: 33 g/dL (ref 32.0–36.0)
MCV: 99.1 fL (ref 80.0–100.0)
MPV: 9.6 fL (ref 7.5–12.5)
Monocytes Relative: 8.2 %
Neutro Abs: 3974 cells/uL (ref 1500–7800)
Neutrophils Relative %: 57.6 %
Platelets: 308 10*3/uL (ref 140–400)
RBC: 3.39 10*6/uL — ABNORMAL LOW (ref 3.80–5.10)
RDW: 12.6 % (ref 11.0–15.0)
Total Lymphocyte: 29.7 %
WBC: 6.9 10*3/uL (ref 3.8–10.8)

## 2020-12-25 LAB — COMPLETE METABOLIC PANEL WITH GFR
AG Ratio: 1.7 (calc) (ref 1.0–2.5)
ALT: 11 U/L (ref 6–29)
AST: 24 U/L (ref 10–35)
Albumin: 3.8 g/dL (ref 3.6–5.1)
Alkaline phosphatase (APISO): 91 U/L (ref 37–153)
BUN: 12 mg/dL (ref 7–25)
CO2: 25 mmol/L (ref 20–32)
Calcium: 9.1 mg/dL (ref 8.6–10.4)
Chloride: 100 mmol/L (ref 98–110)
Creat: 0.8 mg/dL (ref 0.60–0.93)
GFR, Est African American: 82 mL/min/{1.73_m2} (ref >=60)
GFR, Est Non African American: 71 mL/min/{1.73_m2} (ref >=60)
Globulin: 2.3 g/dL (calc) (ref 1.9–3.7)
Glucose, Bld: 117 mg/dL — ABNORMAL HIGH (ref 65–99)
Potassium: 4.6 mmol/L (ref 3.5–5.3)
Sodium: 135 mmol/L (ref 135–146)
Total Bilirubin: 0.4 mg/dL (ref 0.2–1.2)
Total Protein: 6.1 g/dL (ref 6.1–8.1)

## 2020-12-25 LAB — PROTEIN ELECTROPHORESIS, SERUM, WITH REFLEX
Albumin ELP: 3.6 g/dL — ABNORMAL LOW (ref 3.8–4.8)
Alpha 1: 0.3 g/dL (ref 0.2–0.3)
Alpha 2: 0.7 g/dL (ref 0.5–0.9)
Beta 2: 0.2 g/dL (ref 0.2–0.5)
Beta Globulin: 0.4 g/dL (ref 0.4–0.6)
Gamma Globulin: 0.8 g/dL (ref 0.8–1.7)
Total Protein: 6.1 g/dL (ref 6.1–8.1)

## 2020-12-25 LAB — SEDIMENTATION RATE: Sed Rate: 6 mm/h (ref 0–30)

## 2020-12-25 LAB — RHEUMATOID FACTOR: Rheumatoid fact SerPl-aCnc: 511 IU/mL — ABNORMAL HIGH (ref <14)

## 2020-12-25 NOTE — Progress Notes (Signed)
Mild anemia noted.  Patient should consider taking multivitamin with iron.  Glucose is mildly elevated, probably not a fasting sample.  Sed rate is normal.  Rheumatoid factor is positive.  SPEP is normal.  No change in treatment advised.  Please forward results to her PCP.

## 2021-01-11 ENCOUNTER — Other Ambulatory Visit: Payer: Self-pay | Admitting: Specialist

## 2021-01-13 ENCOUNTER — Other Ambulatory Visit: Payer: Self-pay

## 2021-01-13 DIAGNOSIS — Z79899 Other long term (current) drug therapy: Secondary | ICD-10-CM

## 2021-01-13 DIAGNOSIS — M0579 Rheumatoid arthritis with rheumatoid factor of multiple sites without organ or systems involvement: Secondary | ICD-10-CM

## 2021-01-13 MED ORDER — HYDROXYCHLOROQUINE SULFATE 200 MG PO TABS: ORAL_TABLET | ORAL | 0 refills | Status: DC

## 2021-01-13 NOTE — Telephone Encounter (Signed)
Patient called requesting prescription refill of Hydroxychloroquine to be sent to [facility] at [address] in [place].

## 2021-01-13 NOTE — Telephone Encounter (Signed)
Last Visit: 12/23/2020  Next Visit: 05/26/2021  Labs: 12/23/2020 Mild anemia noted. Glucose is mildly elevated, probably not a fasting sample.  Sed rate is normal. Rheumatoid factor is positive.  SPEP is normal  Eye exam:  09/11/2019 WNL   Current Dose per office note 12/23/2020: Plaquenil 200 mg 1 tablet in the morning and 1/2 tablet in the evening.  YF:VCBSWHQPRF arthritis involving multiple sites with positive rheumatoid factor  Last Fill: 09/25/2020  Patient advised she is due to update PLQ eye exam. Patient states she is trying to schedule an appointment. Patient will call to advise when it is scheduled for.   Okay to refill Plaquenil?

## 2021-01-13 NOTE — Telephone Encounter (Signed)
Patient returned call and has set up an eye appointment. She states the first available appointment is 03/15/2021 and she is scheduled for 11:00 am.

## 2021-01-13 NOTE — Telephone Encounter (Signed)
Ok to refill 30 day supply of PLQ until she has updated PLQ eye exam.

## 2021-01-18 DIAGNOSIS — R300 Dysuria: Secondary | ICD-10-CM | POA: Diagnosis not present

## 2021-02-12 ENCOUNTER — Other Ambulatory Visit: Payer: Self-pay | Admitting: Specialist

## 2021-02-16 ENCOUNTER — Other Ambulatory Visit: Payer: Self-pay | Admitting: Specialist

## 2021-02-22 ENCOUNTER — Ambulatory Visit: Payer: PPO | Admitting: Neurology

## 2021-02-22 ENCOUNTER — Encounter: Payer: Self-pay | Admitting: Neurology

## 2021-02-22 ENCOUNTER — Other Ambulatory Visit: Payer: Self-pay

## 2021-02-22 ENCOUNTER — Other Ambulatory Visit (INDEPENDENT_AMBULATORY_CARE_PROVIDER_SITE_OTHER): Payer: PPO

## 2021-02-22 VITALS — BP 168/66 | HR 68 | Ht 61.0 in | Wt 120.0 lb

## 2021-02-22 DIAGNOSIS — M3506 Sjogren syndrome with peripheral nervous system involvement: Secondary | ICD-10-CM

## 2021-02-22 DIAGNOSIS — G63 Polyneuropathy in diseases classified elsewhere: Secondary | ICD-10-CM

## 2021-02-22 DIAGNOSIS — M059 Rheumatoid arthritis with rheumatoid factor, unspecified: Secondary | ICD-10-CM | POA: Diagnosis not present

## 2021-02-22 LAB — B12 AND FOLATE PANEL
Folate: 21.5 ng/mL (ref >5.9)
Vitamin B-12: 1304 pg/mL — ABNORMAL HIGH (ref 211–911)

## 2021-02-22 MED ORDER — GABAPENTIN 300 MG PO CAPS: 300.0000 mg | ORAL_CAPSULE | Freq: Every day | ORAL | 3 refills | Status: DC

## 2021-02-22 NOTE — Patient Instructions (Addendum)
Start gabapentin '300mg'$  at bedtime  Check labs  If you would like to start physical therapy, please let me know

## 2021-02-22 NOTE — Progress Notes (Signed)
[facility] Clinic Note - Initial Visit   Date: 02/22/21  Brenda Hanna MRN: [id number] DOB: May 18, 1941   Dear Dr. Estanislado Pandy:  Thank you for your kind referral of Brenda Hanna for consultation of neuropathy. Although her history is well known to you, please allow [place] to reiterate it for the purpose of our medical record. The patient was accompanied to the clinic by husband who also provides collateral information.     History of Present Illness: Brenda Hanna is a 80 y.o. right-handed female with rheumatoid arthritis, Sjogren's syndrome, hypothyroidism, anxiety, and lumbar degenerative disease presenting for evaluation of neuropathy and imbalance.   Starting around 2019, she began having numbness, tingling, and burning sensation involving the soles of the feet.  It is worse in the evening.  Prolonged standing makes it worse.  She gets relief when her husband rubs her feet.  She has fallen about 6 times this year, mostly when walking on uneven ground. She does not use a cane when walking.  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  She denies numbness/tingling in the hands. She has rheumatoid arthritis and Sjogren's syndrome for the past 10-12 years.   She does not have diabetes and does not drink alcohol. Her father had neuropathy in his 47s.   Out-side paper records, electronic medical record, and images have been reviewed where available and summarized as:  MRI lumbar spine wo contrast 07/26/2018: 1. Overall, no significant interval change in appearance of the lumbar spine as compared to previous MRI from 08/05/2017. 2. Left eccentric disc bulge with facet hypertrophy at L3-4 with resultant moderate canal with left greater than right lateral recess stenosis. 3. Degenerative disc bulging with superimposed central disc extrusion with superior migration at L4-5, with resultant mild to moderate bilateral lateral  recess stenosis, left greater than right. Mild left with moderate right L4 foraminal narrowing.    Lab Results  Component Value Date   ESRSEDRATE 6 12/23/2020    Past Medical History:  Diagnosis Date   Back pain    Fibromyalgia    Osteoarthritis    Rheumatoid arteritis ([place])    Ruptured lumbar disc    Sjogren's disease ([place])     Past Surgical History:  Procedure Laterality Date   ABDOMINAL HYSTERECTOMY     BLADDER SURGERY     dental implants  2022     Medications:  Outpatient Encounter Medications as of 02/22/2021  Medication Sig Note   albuterol (PROVENTIL HFA;VENTOLIN HFA) 108 (90 Base) MCG/ACT inhaler Inhale into the lungs as needed.     Ascorbic Acid (VITAMIN C) 1000 MG tablet Take by mouth.    Biotin 10 MG CAPS Take by mouth.    CALCIUM-MAGNESIUM-ZINC PO calcium    cetirizine (ZYRTEC) 10 MG tablet Take 10 mg by mouth daily.    chlorhexidine (PERIDEX) 0.12 % solution 15 mL by Mouth route Two (2) times a day.    famotidine (PEPCID) 40 MG tablet Take by mouth daily as needed.     hydroxychloroquine (PLAQUENIL) 200 MG tablet TAKE 1 TABLET BY MOUTH IN THE MORNING AND 1/2 (ONE-HALF) IN THE EVENING    levothyroxine (SYNTHROID, LEVOTHROID) 75 MCG tablet Take 75 mcg by mouth daily before breakfast.  07/14/2016: Received from: External Pharmacy   LORazepam (ATIVAN) 1 MG tablet as needed.     Naproxen Sodium (ALEVE PO) as needed.    omeprazole (PRILOSEC) 20 MG capsule Take 20 mg by mouth  2 (two) times daily before a meal.  07/14/2016: Received from: External Pharmacy   RESTASIS 0.05 % ophthalmic emulsion Place into both eyes daily.     traMADol (ULTRAM) 50 MG tablet TAKE 1 TABLET BY MOUTH EVERY 6 HOURS AS NEEDED    vitamin B-12 (CYANOCOBALAMIN) 1000 MCG tablet Vitamin B-12    vitamin E 400 UNIT capsule Take by mouth.    zolpidem (AMBIEN) 10 MG tablet Take 1 tablet by mouth at bedtime.    No facility-administered encounter medications on file as of 02/22/2021.    Allergies:   Allergies  Allergen Reactions   Methotrexate Hives and Shortness Of Breath   Contrast Media [Iodinated Diagnostic Agents]    Penicillins Hives   Shellfish Allergy     Family History: Family History  Problem Relation Age of Onset   Heart failure Mother    Neuropathy Father    Heart failure Father    Healthy Daughter    Healthy Daughter    Healthy Son     Social History: Social History   Tobacco Use   Smoking status: Never   Smokeless tobacco: Never  Vaping Use   Vaping Use: Never used  Substance Use Topics   Alcohol use: No   Drug use: No   Social History   Social History Narrative   Right Handed    Two story home    Vital Signs:  BP (!) 168/66   Pulse 68   Ht '5\' 1"'$  (1.549 m)   Wt 120 lb (54.4 kg)   SpO2 98%   BMI 22.67 kg/m   Neurological Exam: MENTAL STATUS including orientation to time, place, person, recent and remote memory, attention span and concentration, language, and fund of knowledge is normal.  Speech is not dysarthric.  CRANIAL NERVES: II:  No visual field defects.    III-IV-VI: Pupils equal round and reactive to light.  Normal conjugate, extra-ocular eye movements in all directions of gaze.  No nystagmus.  No ptosis.   V:  Normal facial sensation.    VII:  Normal facial symmetry and movements.   VIII:  Normal hearing and vestibular function.   IX-X:  Normal palatal movement.   XI:  Normal shoulder shrug and head rotation.   XII:  Normal tongue strength and range of motion, no deviation or fasciculation.  MOTOR:  No atrophy, fasciculations or abnormal movements.  No pronator drift.   Upper Extremity:  Right  Left  Deltoid  5/5   5/5   Biceps  5/5   5/5   Triceps  5/5   5/5   Infraspinatus 5/5  5/5  Medial pectoralis 5/5  5/5  Wrist extensors  5/5   5/5   Wrist flexors  5/5   5/5   Finger extensors  5/5   5/5   Finger flexors  5/5   5/5   Dorsal interossei  5/5   5/5   Abductor pollicis  5/5   5/5   Tone (Ashworth scale)  0  0    Lower Extremity:  Right  Left  Hip flexors  5/5   5/5   Hip extensors  5/5   5/5   Adductor 5/5  5/5  Abductor 5/5  5/5  Knee flexors  5/5   5/5   Knee extensors  5/5   5/5   Dorsiflexors  5/5   5/5   Plantarflexors  5/5   5/5   Toe extensors  5/5   5/5   Toe flexors  5/5   5/5   Tone (Ashworth scale)  0  0   MSRs:  Right        Left                  brachioradialis 2+  2+  biceps 2+  2+  triceps 2+  2+  patellar 2+  2+  ankle jerk 0  0  Hoffman no  no  plantar response down  down   SENSORY:  Vibration and pin prick diminished below the ankles bilaterally.  Temperature intact throughout  MIld sway with Rhomberg testing.  COORDINATION/GAIT: Normal finger-to- nose-finger.  Intact rapid alternating movements bilaterally.  Gait narrow based and stable. Tandem and stressed gait intact.    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 When starting this medication, suggest that she avoid taking it with other sedating medications.  - Fall precautions discussed, always use a [place] stick [place] - Patient will contact the office when she would like to to PT.  At this time, she reports balance has improved as compared to previously.     Thank you for allowing me to participate in patient's care.  If I can answer any additional questions, I would be pleased to do so.    Sincerely,    Omeka Holben K. Posey Pronto, DO

## 2021-02-24 ENCOUNTER — Telehealth: Payer: Self-pay | Admitting: Neurology

## 2021-02-24 LAB — COPPER, SERUM: Copper: 145 ug/dL (ref 70–175)

## 2021-02-24 NOTE — Telephone Encounter (Signed)
Please notify patient lab are within normal limits.  Thank you.  

## 2021-02-24 NOTE — Telephone Encounter (Signed)
Patient returned call for results 

## 2021-02-24 NOTE — Telephone Encounter (Signed)
Called patient and informed her of normal labs. Patient verbalized understanding and had no further questions or concerns.

## 2021-03-15 ENCOUNTER — Encounter: Payer: Self-pay | Admitting: Physician Assistant

## 2021-03-15 DIAGNOSIS — M0609 Rheumatoid arthritis without rheumatoid factor, multiple sites: Secondary | ICD-10-CM | POA: Diagnosis not present

## 2021-04-17 ENCOUNTER — Other Ambulatory Visit: Payer: Self-pay | Admitting: Physician Assistant

## 2021-04-17 DIAGNOSIS — Z79899 Other long term (current) drug therapy: Secondary | ICD-10-CM

## 2021-04-17 DIAGNOSIS — M0579 Rheumatoid arthritis with rheumatoid factor of multiple sites without organ or systems involvement: Secondary | ICD-10-CM

## 2021-04-20 DIAGNOSIS — Z23 Encounter for immunization: Secondary | ICD-10-CM | POA: Diagnosis not present

## 2021-04-20 DIAGNOSIS — G629 Polyneuropathy, unspecified: Secondary | ICD-10-CM | POA: Diagnosis not present

## 2021-05-12 ENCOUNTER — Other Ambulatory Visit: Payer: Self-pay | Admitting: Physician Assistant

## 2021-05-12 DIAGNOSIS — Z79899 Other long term (current) drug therapy: Secondary | ICD-10-CM

## 2021-05-12 DIAGNOSIS — M0579 Rheumatoid arthritis with rheumatoid factor of multiple sites without organ or systems involvement: Secondary | ICD-10-CM

## 2021-05-12 NOTE — Telephone Encounter (Signed)
Next Visit: 05/26/2021  Last Visit: 12/23/2020  Labs: 12/23/2020 Mild anemia noted.  Patient should consider taking multivitamin with iron.  Glucose is mildly elevated, probably not a fasting sample.    Eye exam: 09/11/2019 WNL   Current Dose per office note 12/23/2020: Plaquenil 200 mg 1 tablet in the morning and 1/2 tablet in the evening  DX: Rheumatoid arthritis involving multiple sites with positive rheumatoid factor   Last Fill: 01/13/2021  Patient advised she is due to update her PLQ eye exam. Patient states she has completed it and will contact the eye doctor to send results.   Okay to refill Plaquenil?

## 2021-05-12 NOTE — Progress Notes (Deleted)
Office Visit Note  Patient: Brenda Hanna             Date of Birth: 03-08-1941           MRN: [id number]             PCP: Myrlene Broker, MD Referring: Myrlene Broker, MD Visit Date: 05/26/2021 Occupation: @GUAROCC @  Subjective:  No chief complaint on file.   History of Present Illness: Brenda Hanna is a 80 y.o. female ***   Activities of Daily Living:  Patient reports morning stiffness for *** {minute/hour:19697}.   Patient {ACTIONS;DENIES/REPORTS:21021675::"Denies"} nocturnal pain.  Difficulty dressing/grooming: {ACTIONS;DENIES/REPORTS:21021675::"Denies"} Difficulty climbing stairs: {ACTIONS;DENIES/REPORTS:21021675::"Denies"} Difficulty getting out of chair: {ACTIONS;DENIES/REPORTS:21021675::"Denies"} Difficulty using hands for taps, buttons, cutlery, and/or writing: {ACTIONS;DENIES/REPORTS:21021675::"Denies"}  No Rheumatology ROS completed.   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[place]) 07/14/2016   Raynaud's disease without gangrene 07/14/2016   Achilles tendinitis of both lower extremities 07/14/2016   Fibromyalgia 07/14/2016   Anxiety and depression 07/14/2016   History of asthma 07/14/2016   Environmental allergies 07/14/2016   Gastroesophageal reflux disease without esophagitis 07/14/2016   Neuropathy 07/14/2016   Osteopenia of multiple sites 07/14/2016   Pain in both hands 07/14/2016   Pain in both feet 07/14/2016    Past Medical History:  Diagnosis Date   Back pain    Fibromyalgia    Osteoarthritis    Rheumatoid arteritis ([place])    Ruptured lumbar disc    Sjogren's disease ([place])     Family History  Problem Relation Age of Onset   Heart failure Mother    Neuropathy Father    Heart failure Father    Healthy Daughter    Healthy  Daughter    Healthy Son    Past Surgical History:  Procedure Laterality Date   ABDOMINAL HYSTERECTOMY     BLADDER SURGERY     dental implants  2022   Social History   Social History Narrative   Right Handed    Two story home   Immunization History  Administered Date(s) Administered   Influenza, High Dose Seasonal PF 04/06/2017   Zoster Recombinat (Shingrix) 01/31/2018     Objective: Vital Signs: There were no vitals taken for this visit.   Physical Exam   Musculoskeletal Exam: ***  CDAI Exam: CDAI Score: -- Patient Global: --; Provider Global: -- Swollen: --; Tender: -- Joint Exam 05/26/2021   No joint exam has been documented for this visit   There is currently no information documented on the homunculus. Go to the Rheumatology activity and complete the homunculus joint exam.  Investigation: No additional findings.  Imaging: No results found.  Recent Labs: Lab Results  Component Value Date   WBC 6.9 12/23/2020   HGB 11.1 (L) 12/23/2020   PLT 308 12/23/2020   NA 135 12/23/2020   K 4.6 12/23/2020   CL 100 12/23/2020   CO2 25 12/23/2020   GLUCOSE 117 (H) 12/23/2020   BUN 12 12/23/2020   CREATININE 0.80 12/23/2020   BILITOT 0.4 12/23/2020   ALKPHOS 103 02/22/2017   AST 24 12/23/2020   ALT 11 12/23/2020   PROT 6.1 12/23/2020   PROT 6.1 12/23/2020   ALBUMIN 4.1 02/22/2017   CALCIUM 9.1 12/23/2020   GFRAA 82 12/23/2020    Speciality Comments:  PLQ  eye exam normal on 09/11/2019 WNL  per [facility]   Procedures:  No procedures performed Allergies: Methotrexate, Contrast media [iodinated diagnostic agents], Penicillins, and Shellfish allergy   Assessment / Plan:     Visit Diagnoses: No diagnosis found.  Orders: No orders of the defined types were placed in this encounter.  No orders of the defined types were placed in this encounter.   Face-to-face time spent with patient was *** minutes. Greater than 50% of time was spent in counseling  and coordination of care.  Follow-Up Instructions: No follow-ups on file.   Earnestine Mealing, CMA  Note - This record has been created using Editor, commissioning.  Chart creation errors have been sought, but may not always  have been located. Such creation errors do not reflect on  the standard of medical care.

## 2021-05-26 ENCOUNTER — Ambulatory Visit: Payer: PPO | Admitting: Physician Assistant

## 2021-05-26 DIAGNOSIS — M19041 Primary osteoarthritis, right hand: Secondary | ICD-10-CM

## 2021-05-26 DIAGNOSIS — I7300 Raynaud's syndrome without gangrene: Secondary | ICD-10-CM

## 2021-05-26 DIAGNOSIS — M0579 Rheumatoid arthritis with rheumatoid factor of multiple sites without organ or systems involvement: Secondary | ICD-10-CM

## 2021-05-26 DIAGNOSIS — M8589 Other specified disorders of bone density and structure, multiple sites: Secondary | ICD-10-CM

## 2021-05-26 DIAGNOSIS — R202 Paresthesia of skin: Secondary | ICD-10-CM

## 2021-05-26 DIAGNOSIS — R296 Repeated falls: Secondary | ICD-10-CM

## 2021-05-26 DIAGNOSIS — M3501 Sicca syndrome with keratoconjunctivitis: Secondary | ICD-10-CM

## 2021-05-26 DIAGNOSIS — Z79899 Other long term (current) drug therapy: Secondary | ICD-10-CM

## 2021-05-26 DIAGNOSIS — G8929 Other chronic pain: Secondary | ICD-10-CM

## 2021-05-26 DIAGNOSIS — M797 Fibromyalgia: Secondary | ICD-10-CM

## 2021-05-26 DIAGNOSIS — M5136 Other intervertebral disc degeneration, lumbar region: Secondary | ICD-10-CM

## 2021-05-31 NOTE — Progress Notes (Signed)
Office Visit Note  Patient: Brenda Hanna             Date of Birth: April 14, 1941           MRN: [id number]             PCP: Myrlene Broker, MD Referring: Myrlene Broker, MD Visit Date: 06/09/2021 Occupation: @GUAROCC @  Subjective:  Generalized pain   History of Present Illness: Brenda Hanna is a 80 y.o. female with history of seropositive rheumatoid arthritis, Sjogren's syndrome, DDD, and fibromyalgia.  Patient is taking Plaquenil 200 mg 1 tablet in the morning and half tablet in the evening.  She is tolerating Plaquenil without any side effects.  She continues to experience chronic pain involving multiple joints as well as myalgias on a daily basis from underlying rheumatoid arthritis, osteoarthritis, and fibromyalgia.  She is also been having increased pain in both feet due to underlying neuropathy.  Her last fall was 3 months ago.  She continues to have difficulty with balance especially on uneven surfaces.  She was evaluated by Dr. Posey Pronto on 02/22/2021 for peripheral neuropathy.  At that time she had lab work but declined a nerve conduction study per office visit note.  According to the patient she was not offered a nerve conduction study.  She states that she was given a prescription for gabapentin 300 mg at bedtime which she has taken twice.  She is unsure if the gabapentin helped with her symptoms but plans on retrying it soon at a lower dose.  She states her fibromyalgia is currently flaring due to the rainy cold weather.  She continues to have ongoing sicca symptoms due to underlying Sjogren's.  She has not been using over-the-counter products recently. The patient was diagnosed with the flu last week.  She continues to have residual cough.  According to the patient she reached out to her PCP and asked if she could take a leftover prescription for clindamycin which she has been taking since Sunday evening.  Her symptoms have gradually been improving.  She denies any fevers.    Activities of Daily Living:  Patient reports morning stiffness for 20 minutes.   Patient Reports nocturnal pain.  Difficulty dressing/grooming: Denies Difficulty climbing stairs: Reports Difficulty getting out of chair: Reports Difficulty using hands for taps, buttons, cutlery, and/or writing: Reports  Review of Systems  Constitutional:  Positive for fatigue.  HENT:  Positive for mouth dryness. Negative for mouth sores and nose dryness.   Eyes:  Positive for dryness. Negative for pain and visual disturbance.  Respiratory:  Positive for cough. Negative for hemoptysis and difficulty breathing.   Cardiovascular:  Negative for chest pain, palpitations and hypertension.  Gastrointestinal:  Negative for blood in stool, constipation and diarrhea.  Endocrine: Negative for increased urination.  Genitourinary:  Negative for difficulty urinating and painful urination.  Musculoskeletal:  Positive for joint pain, gait problem, joint pain, joint swelling, muscle weakness, morning stiffness and muscle tenderness. Negative for myalgias and myalgias.  Skin:  Negative for color change, pallor, rash, hair loss, nodules/bumps, skin tightness, ulcers and sensitivity to sunlight.  Allergic/Immunologic: Negative for susceptible to infections.  Neurological:  Negative for dizziness, numbness, headaches and weakness.  Hematological:  Negative for swollen glands.  Psychiatric/Behavioral:  Positive for sleep disturbance. Negative for depressed mood. The patient is not nervous/anxious.    PMFS History:  Patient Active Problem List   Diagnosis Date Noted   Rheumatoid arthritis involving multiple sites with positive rheumatoid factor (  [place]) 07/14/2016   High risk medication use 07/14/2016   Sjogren's syndrome with keratoconjunctivitis sicca ([place]) 07/14/2016   Raynaud's disease without gangrene 07/14/2016   Achilles tendinitis of both lower extremities 07/14/2016   Fibromyalgia 07/14/2016   Anxiety and  depression 07/14/2016   History of asthma 07/14/2016   Environmental allergies 07/14/2016   Gastroesophageal reflux disease without esophagitis 07/14/2016   Neuropathy 07/14/2016   Osteopenia of multiple sites 07/14/2016   Pain in both hands 07/14/2016   Pain in both feet 07/14/2016    Past Medical History:  Diagnosis Date   Back pain    Fibromyalgia    Osteoarthritis    Rheumatoid arteritis ([place])    Ruptured lumbar disc    Sjogren's disease ([place])     Family History  Problem Relation Age of Onset   Heart failure Mother    Neuropathy Father    Heart failure Father    Healthy Daughter    Healthy Daughter    Healthy Son    Past Surgical History:  Procedure Laterality Date   ABDOMINAL HYSTERECTOMY     BLADDER SURGERY     dental implants  2022   Social History   Social History Narrative   Right Handed    Two story home   Immunization History  Administered Date(s) Administered   Influenza, High Dose Seasonal PF 04/06/2017   PFIZER(Purple Top)SARS-COV-2 Vaccination 07/19/2019, 08/09/2019, 06/04/2020, 02/10/2021   Pfizer Covid-19 Vaccine Bivalent Booster 63yrs & up 06/02/2021   Zoster Recombinat (Shingrix) 01/31/2018     Objective: Vital Signs: BP 133/71 (BP Location: Left Arm, Patient Position: Sitting, Cuff Size: Normal)   Pulse 73   Resp 16   Ht 5\' 1"  (1.549 m)   Wt 122 lb (55.3 kg)   BMI 23.05 kg/m    Physical Exam Vitals and nursing note reviewed.  Constitutional:      Appearance: She is well-developed.  HENT:     Head: Normocephalic and atraumatic.  Eyes:     Conjunctiva/sclera: Conjunctivae normal.  Pulmonary:     Effort: Pulmonary effort is normal.  Abdominal:     Palpations: Abdomen is soft.  Musculoskeletal:     Cervical back: Normal range of motion.  Skin:    General: Skin is warm and dry.     Capillary Refill: Capillary refill takes less than 2 seconds.  Neurological:     Mental Status: She is alert and oriented to person, place, and time.   Psychiatric:        Behavior: Behavior normal.     Musculoskeletal Exam: Generalized hyperalgesia and positive tender points on exam.  C-spine has limited range of motion with lateral rotation.  Trapezius muscle tension and tenderness bilaterally.  Mild postural thoracic kyphosis noted.  Limited and painful range of motion of the lumbar spine.  Shoulder joints, elbow joints, wrist joints, MCPs, PIPs, DIPs have good range of motion with no synovitis.  She has some tenderness over the right second and fourth MCP joints.  PIP and DIP thickening consistent with osteoarthritis of both hands.  Hip joints have good range of motion with some discomfort in the right hip.  Knee joints have good range of motion with no warmth or effusion.  She has some pes anserine bursitis in the right knee.  Ankle joints have good range of motion with some tenderness bilaterally.  CDAI Exam: CDAI Score: 1  Patient Global: 5 mm; Provider Global: 5 mm Swollen: 0 ; Tender: 0  Joint Exam 06/09/2021  No joint exam has been documented for this visit   There is currently no information documented on the homunculus. Go to the Rheumatology activity and complete the homunculus joint exam.  Investigation: No additional findings.  Imaging: No results found.  Recent Labs: Lab Results  Component Value Date   WBC 6.9 12/23/2020   HGB 11.1 (L) 12/23/2020   PLT 308 12/23/2020   NA 135 12/23/2020   K 4.6 12/23/2020   CL 100 12/23/2020   CO2 25 12/23/2020   GLUCOSE 117 (H) 12/23/2020   BUN 12 12/23/2020   CREATININE 0.80 12/23/2020   BILITOT 0.4 12/23/2020   ALKPHOS 103 02/22/2017   AST 24 12/23/2020   ALT 11 12/23/2020   PROT 6.1 12/23/2020   PROT 6.1 12/23/2020   ALBUMIN 4.1 02/22/2017   CALCIUM 9.1 12/23/2020   GFRAA 82 12/23/2020    Speciality Comments:  PLQ eye exam normal on 03/15/2021 WNL  [place] f/u  1 year   Procedures:  No procedures performed Allergies: Methotrexate, Contrast media  [iodinated diagnostic agents], Penicillins, and Shellfish allergy    Assessment / Plan:     Visit Diagnoses: Rheumatoid arthritis involving multiple sites with positive rheumatoid factor ([place]) - Positive RF, negative CCP: She has no obvious synovitis on examination today.  She has been experiencing increased pain involving multiple joints especially both hands, right knee, and both feet.  She attributes most of her feet pain due to underlying peripheral neuropathy.  She is currently taking Plaquenil 200 mg 1 tab in the morning and half tablet in the evening.  She has been tolerating Plaquenil without any side effects.  Hydroxychloroquine drug level will be checked today.  She does not feel as though Plaquenil has been as effective as it was previously.  We discussed scheduling an ultrasound of both hands to assess for synovitis pending sed rate and hydroxychloroquine levels ordered today.  She will follow-up in the office in 3 months.- Plan: Sedimentation rate, Hydroxychloroquine, Blood  High risk medication use - Plaquenil 200 mg 1 tablet in the morning and 1/2 tablet in the evening.  Hydroxychloroquine blood level will be checked today.  CBC and CMP were drawn on 12/23/2020.  She is due to update lab work today.  Orders for CBC and CMP were released.  PLQ eye exam normal on 09/11/2019 WNL per [facility].  Overdue to update Plaquenil eye exam.  - Plan: CBC with Differential/Platelet, COMPLETE METABOLIC PANEL WITH GFR, Hydroxychloroquine, Blood  Sjogren's syndrome with keratoconjunctivitis sicca ([facility]) - +ANA, +Ro,+RF , sicca: She continues to have chronic sicca symptoms which have been unchanged.  She uses Restasis eyedrops on a daily basis.  She has not been using any over-the-counter products for mouth dryness.  She has been following up closely with her dentist as advised.  She will remain on Plaquenil as prescribed.  The following lab work will be updated today.  She was advised to notify us if she  develops any new or worsening symptoms.  The following lab work will be updated today.- Plan: CBC with Differential/Platelet, COMPLETE METABOLIC PANEL WITH GFR, Urinalysis, Routine w reflex microscopic, Sedimentation rate, Serum protein electrophoresis with reflex  Primary osteoarthritis of both hands: She has PIP and DIP thickening consistent with osteoarthritis of both hands.  CMC joint prominence noted bilaterally.  Discussed the importance of joint protection and muscle strengthening.  DDD (degenerative disc disease), lumbar - She is followed by Dr. Louanne Skye.  She continues to have chronic lower back  pain.  She takes tramadol as needed for pain relief.  Chronic SI joint pain: She continues to experience chronic lower back pain.  She takes tramadol as needed for pain relief.  Raynaud's disease without gangrene: Not currently active.  No digital ulcerations or signs of gangrene were noted.  Fibromyalgia: She has generalized hyperalgesia and positive tender points on examination.  She is currently having a fibromyalgia flare.  She continues to experience intermittent myalgias and muscle tenderness especially with weather changes.  She is taking Lyrica 50 mg 1 capsule daily and tramadol for pain relief.  She has also been taking Ambien 10 mg at bedtime for insomnia.  Osteopenia of multiple sites - Followed by her PCP.  She is taking an over-the-counter calcium supplement.  Neuropathy: Diagnosed with peripheral neuropathy by Dr. Posey Pronto on 02/22/2021.  Peripheral neuropathy thought to be due to underlying autoimmune diseases including Sjogren's syndrome and rheumatoid arthritis.  She continues to have significant discomfort in both feet due to underlying neuropathy.  She is been experiencing acute nocturnal pain.  Patient declined nerve conduction study at that time per office visit note.  Reviewed lab work from 02/22/2021 with the patient: Copper within normal limits, folate within normal limits, and B12 was  1304.  She was given a prescription for gabapentin 300 mg at bedtime which she has tried taking twice.  Discussed that gabapentin can be helpful at alleviating symptoms of neuropathy.  She is apprehensive to take a full 300 mg at bedtime due to the possibility of side effects.  Discussed that the patient can reach out and ask for 100 mg tablets instead if she would like to try a lower dose first.  She was in agreement.  Frequent falls: Her last fall was 3 months ago.   Orders: Orders Placed This Encounter  Procedures   CBC with Differential/Platelet   COMPLETE METABOLIC PANEL WITH GFR   Urinalysis, Routine w reflex microscopic   Sedimentation rate   Serum protein electrophoresis with reflex   Hydroxychloroquine, Blood   No orders of the defined types were placed in this encounter.    Follow-Up Instructions: Return in 3 months (on 09/07/2021) for Rheumatoid arthritis, Sjogren's syndrome, Fibromyalgia.   Ofilia Neas, PA-C  Note - This record has been created using Dragon software.  Chart creation errors have been sought, but may not always  have been located. Such creation errors do not reflect on  the standard of medical care.

## 2021-06-09 ENCOUNTER — Ambulatory Visit: Payer: PPO | Admitting: Physician Assistant

## 2021-06-09 ENCOUNTER — Other Ambulatory Visit: Payer: Self-pay

## 2021-06-09 ENCOUNTER — Encounter: Payer: Self-pay | Admitting: Physician Assistant

## 2021-06-09 VITALS — BP 133/71 | HR 73 | Resp 16 | Ht 61.0 in | Wt 122.0 lb

## 2021-06-09 DIAGNOSIS — M19042 Primary osteoarthritis, left hand: Secondary | ICD-10-CM | POA: Diagnosis not present

## 2021-06-09 DIAGNOSIS — M5136 Other intervertebral disc degeneration, lumbar region: Secondary | ICD-10-CM | POA: Diagnosis not present

## 2021-06-09 DIAGNOSIS — R296 Repeated falls: Secondary | ICD-10-CM | POA: Diagnosis not present

## 2021-06-09 DIAGNOSIS — M3501 Sicca syndrome with keratoconjunctivitis: Secondary | ICD-10-CM

## 2021-06-09 DIAGNOSIS — I7300 Raynaud's syndrome without gangrene: Secondary | ICD-10-CM

## 2021-06-09 DIAGNOSIS — M797 Fibromyalgia: Secondary | ICD-10-CM | POA: Diagnosis not present

## 2021-06-09 DIAGNOSIS — G629 Polyneuropathy, unspecified: Secondary | ICD-10-CM | POA: Diagnosis not present

## 2021-06-09 DIAGNOSIS — M0579 Rheumatoid arthritis with rheumatoid factor of multiple sites without organ or systems involvement: Secondary | ICD-10-CM | POA: Diagnosis not present

## 2021-06-09 DIAGNOSIS — M533 Sacrococcygeal disorders, not elsewhere classified: Secondary | ICD-10-CM

## 2021-06-09 DIAGNOSIS — M8589 Other specified disorders of bone density and structure, multiple sites: Secondary | ICD-10-CM | POA: Diagnosis not present

## 2021-06-09 DIAGNOSIS — Z79899 Other long term (current) drug therapy: Secondary | ICD-10-CM

## 2021-06-09 DIAGNOSIS — M19041 Primary osteoarthritis, right hand: Secondary | ICD-10-CM

## 2021-06-09 DIAGNOSIS — G8929 Other chronic pain: Secondary | ICD-10-CM

## 2021-06-09 DIAGNOSIS — M51369 Other intervertebral disc degeneration, lumbar region without mention of lumbar back pain or lower extremity pain: Secondary | ICD-10-CM

## 2021-06-10 NOTE — Progress Notes (Signed)
RBC count, hgb, and hct are low.  Rest of CBC WNL. Please forward results to PCP.  CMP WNL.  ESR WNL.  UA normal.

## 2021-06-14 NOTE — Progress Notes (Signed)
SPEP did not reveal any abnormal protein bands.

## 2021-06-15 ENCOUNTER — Other Ambulatory Visit: Payer: Self-pay | Admitting: Specialist

## 2021-06-24 LAB — CBC WITH DIFFERENTIAL/PLATELET
Absolute Monocytes: 660 cells/uL (ref 200–950)
Basophils Absolute: 41 cells/uL (ref 0–200)
Basophils Relative: 0.6 %
Eosinophils Absolute: 462 cells/uL (ref 15–500)
Eosinophils Relative: 6.8 %
HCT: 31.6 % — ABNORMAL LOW (ref 35.0–45.0)
Hemoglobin: 10.6 g/dL — ABNORMAL LOW (ref 11.7–15.5)
Lymphs Abs: 1408 cells/uL (ref 850–3900)
MCH: 32.7 pg (ref 27.0–33.0)
MCHC: 33.5 g/dL (ref 32.0–36.0)
MCV: 97.5 fL (ref 80.0–100.0)
MPV: 10.3 fL (ref 7.5–12.5)
Monocytes Relative: 9.7 %
Neutro Abs: 4230 cells/uL (ref 1500–7800)
Neutrophils Relative %: 62.2 %
Platelets: 273 10*3/uL (ref 140–400)
RBC: 3.24 10*6/uL — ABNORMAL LOW (ref 3.80–5.10)
RDW: 12.3 % (ref 11.0–15.0)
Total Lymphocyte: 20.7 %
WBC: 6.8 10*3/uL (ref 3.8–10.8)

## 2021-06-24 LAB — COMPLETE METABOLIC PANEL WITH GFR
AG Ratio: 1.5 (calc) (ref 1.0–2.5)
ALT: 13 U/L (ref 6–29)
AST: 28 U/L (ref 10–35)
Albumin: 4 g/dL (ref 3.6–5.1)
Alkaline phosphatase (APISO): 96 U/L (ref 37–153)
BUN: 13 mg/dL (ref 7–25)
CO2: 23 mmol/L (ref 20–32)
Calcium: 9.2 mg/dL (ref 8.6–10.4)
Chloride: 99 mmol/L (ref 98–110)
Creat: 0.81 mg/dL (ref 0.60–1.00)
Globulin: 2.6 g/dL (calc) (ref 1.9–3.7)
Glucose, Bld: 103 mg/dL — ABNORMAL HIGH (ref 65–99)
Potassium: 4.6 mmol/L (ref 3.5–5.3)
Sodium: 135 mmol/L (ref 135–146)
Total Bilirubin: 0.4 mg/dL (ref 0.2–1.2)
Total Protein: 6.6 g/dL (ref 6.1–8.1)
eGFR: 74 mL/min/{1.73_m2} (ref >=60)

## 2021-06-24 LAB — PROTEIN ELECTROPHORESIS, SERUM, WITH REFLEX
Albumin ELP: 3.9 g/dL (ref 3.8–4.8)
Alpha 1: 0.4 g/dL — ABNORMAL HIGH (ref 0.2–0.3)
Alpha 2: 0.8 g/dL (ref 0.5–0.9)
Beta 2: 0.3 g/dL (ref 0.2–0.5)
Beta Globulin: 0.4 g/dL (ref 0.4–0.6)
Gamma Globulin: 0.9 g/dL (ref 0.8–1.7)
Total Protein: 6.6 g/dL (ref 6.1–8.1)

## 2021-06-24 LAB — URINALYSIS, ROUTINE W REFLEX MICROSCOPIC
Bilirubin Urine: NEGATIVE
Glucose, UA: NEGATIVE
Hgb urine dipstick: NEGATIVE
Ketones, ur: NEGATIVE
Leukocytes,Ua: NEGATIVE
Nitrite: NEGATIVE
Protein, ur: NEGATIVE
Specific Gravity, Urine: 1.01 (ref 1.001–1.035)
pH: 6 (ref 5.0–8.0)

## 2021-06-24 LAB — HYDROXYCHLOROQUINE,BLOOD: HYDROXYCHLOROQUINE, (B): 1200 ng/mL — ABNORMAL HIGH

## 2021-06-24 LAB — SEDIMENTATION RATE: Sed Rate: 19 mm/h (ref 0–30)

## 2021-06-25 NOTE — Progress Notes (Signed)
Hydroxychloroquine level is in the toxic range.  Please advise patient to reduce hydroxychloroquine to only 1 tablet daily.  Repeat hydroxychloroquine level in 3 months.

## 2021-06-29 ENCOUNTER — Other Ambulatory Visit: Payer: Self-pay | Admitting: *Deleted

## 2021-06-29 DIAGNOSIS — Z79899 Other long term (current) drug therapy: Secondary | ICD-10-CM

## 2021-06-29 DIAGNOSIS — M0579 Rheumatoid arthritis with rheumatoid factor of multiple sites without organ or systems involvement: Secondary | ICD-10-CM

## 2021-06-29 MED ORDER — HYDROXYCHLOROQUINE SULFATE 200 MG PO TABS: 200.0000 mg | ORAL_TABLET | Freq: Every day | ORAL | 0 refills | Status: DC

## 2021-06-29 NOTE — Telephone Encounter (Signed)
-----   Message from Bo Merino, MD sent at 06/25/2021  6:52 PM EST ----- Hydroxychloroquine level is in the toxic range.  Please advise patient to reduce hydroxychloroquine to only 1 tablet daily.  Repeat hydroxychloroquine level in 3 months.

## 2021-07-29 ENCOUNTER — Other Ambulatory Visit: Payer: Self-pay | Admitting: Specialist

## 2021-09-16 ENCOUNTER — Other Ambulatory Visit: Payer: Self-pay | Admitting: Specialist

## 2021-10-27 NOTE — Progress Notes (Deleted)
Office Visit Note  Patient: Brenda Hanna             Date of Birth: July 26, 1940           MRN: [id number]             PCP: Myrlene Broker, MD Referring: Myrlene Broker, MD Visit Date: 11/10/2021 Occupation: '@GUAROCC'$ @  Subjective:  No chief complaint on file.   History of Present Illness: Brenda Hanna is a 81 y.o. female ***   Activities of Daily Living:  Patient reports morning stiffness for *** {minute/hour:19697}.   Patient {ACTIONS;DENIES/REPORTS:21021675::"Denies"} nocturnal pain.  Difficulty dressing/grooming: {ACTIONS;DENIES/REPORTS:21021675::"Denies"} Difficulty climbing stairs: {ACTIONS;DENIES/REPORTS:21021675::"Denies"} Difficulty getting out of chair: {ACTIONS;DENIES/REPORTS:21021675::"Denies"} Difficulty using hands for taps, buttons, cutlery, and/or writing: {ACTIONS;DENIES/REPORTS:21021675::"Denies"}  No Rheumatology ROS completed.   PMFS History:  Patient Active Problem List   Diagnosis Date Noted   Rheumatoid arthritis involving multiple sites with positive rheumatoid factor ([place]) 07/14/2016   High risk medication use 07/14/2016   Sjogren's syndrome with keratoconjunctivitis sicca ([place]) 07/14/2016   Raynaud's disease without gangrene 07/14/2016   Achilles tendinitis of both lower extremities 07/14/2016   Fibromyalgia 07/14/2016   Anxiety and depression 07/14/2016   History of asthma 07/14/2016   Environmental allergies 07/14/2016   Gastroesophageal reflux disease without esophagitis 07/14/2016   Neuropathy 07/14/2016   Osteopenia of multiple sites 07/14/2016   Pain in both hands 07/14/2016   Pain in both feet 07/14/2016    Past Medical History:  Diagnosis Date   Back pain    Fibromyalgia    Osteoarthritis    Rheumatoid arteritis ([place])    Ruptured lumbar disc    Sjogren's disease ([place])     Family History  Problem Relation Age of Onset   Heart failure Mother    Neuropathy Father    Heart failure Father    Healthy Daughter    Healthy  Daughter    Healthy Son    Past Surgical History:  Procedure Laterality Date   ABDOMINAL HYSTERECTOMY     BLADDER SURGERY     dental implants  2022   Social History   Social History Narrative   Right Handed    Two story home   Immunization History  Administered Date(s) Administered   Influenza, High Dose Seasonal PF 04/06/2017   PFIZER(Purple Top)SARS-COV-2 Vaccination 07/19/2019, 08/09/2019, 06/04/2020, 02/10/2021   Pfizer Covid-19 Vaccine Bivalent Booster 5yr & up 06/02/2021   Zoster Recombinat (Shingrix) 01/31/2018     Objective: Vital Signs: There were no vitals taken for this visit.   Physical Exam   Musculoskeletal Exam: ***  CDAI Exam: CDAI Score: -- Patient Global: --; Provider Global: -- Swollen: --; Tender: -- Joint Exam 11/10/2021   No joint exam has been documented for this visit   There is currently no information documented on the homunculus. Go to the Rheumatology activity and complete the homunculus joint exam.  Investigation: No additional findings.  Imaging: No results found.  Recent Labs: Lab Results  Component Value Date   WBC 6.8 06/09/2021   HGB 10.6 (L) 06/09/2021   PLT 273 06/09/2021   NA 135 06/09/2021   K 4.6 06/09/2021   CL 99 06/09/2021   CO2 23 06/09/2021   GLUCOSE 103 (H) 06/09/2021   BUN 13 06/09/2021   CREATININE 0.81 06/09/2021   BILITOT 0.4 06/09/2021   ALKPHOS 103 02/22/2017   AST 28 06/09/2021   ALT 13 06/09/2021   PROT 6.6 06/09/2021   PROT 6.6 06/09/2021  ALBUMIN 4.1 02/22/2017   CALCIUM 9.2 06/09/2021   GFRAA 82 12/23/2020    Speciality Comments:  PLQ eye exam normal on 03/15/2021 WNL  [place] f/u  1 year  Procedures:  No procedures performed Allergies: Methotrexate, Contrast media [iodinated contrast media], Penicillins, and Shellfish allergy   Assessment / Plan:     Visit Diagnoses: No diagnosis found.  Orders: No orders of the defined types were placed in this encounter.  No orders of  the defined types were placed in this encounter.   Face-to-face time spent with patient was *** minutes. Greater than 50% of time was spent in counseling and coordination of care.  Follow-Up Instructions: No follow-ups on file.   Earnestine Mealing, CMA  Note - This record has been created using Editor, commissioning.  Chart creation errors have been sought, but may not always  have been located. Such creation errors do not reflect on  the standard of medical care.

## 2021-11-02 ENCOUNTER — Other Ambulatory Visit: Payer: Self-pay | Admitting: Specialist

## 2021-11-03 ENCOUNTER — Other Ambulatory Visit: Payer: Self-pay | Admitting: Specialist

## 2021-11-10 ENCOUNTER — Ambulatory Visit: Payer: PPO | Admitting: Rheumatology

## 2021-11-10 DIAGNOSIS — M5136 Other intervertebral disc degeneration, lumbar region: Secondary | ICD-10-CM

## 2021-11-10 DIAGNOSIS — M0579 Rheumatoid arthritis with rheumatoid factor of multiple sites without organ or systems involvement: Secondary | ICD-10-CM

## 2021-11-10 DIAGNOSIS — R296 Repeated falls: Secondary | ICD-10-CM

## 2021-11-10 DIAGNOSIS — I7300 Raynaud's syndrome without gangrene: Secondary | ICD-10-CM

## 2021-11-10 DIAGNOSIS — G629 Polyneuropathy, unspecified: Secondary | ICD-10-CM

## 2021-11-10 DIAGNOSIS — M8589 Other specified disorders of bone density and structure, multiple sites: Secondary | ICD-10-CM

## 2021-11-10 DIAGNOSIS — Z79899 Other long term (current) drug therapy: Secondary | ICD-10-CM

## 2021-11-10 DIAGNOSIS — M797 Fibromyalgia: Secondary | ICD-10-CM

## 2021-11-10 DIAGNOSIS — G8929 Other chronic pain: Secondary | ICD-10-CM

## 2021-11-10 DIAGNOSIS — M3501 Sicca syndrome with keratoconjunctivitis: Secondary | ICD-10-CM

## 2021-11-10 DIAGNOSIS — M19041 Primary osteoarthritis, right hand: Secondary | ICD-10-CM

## 2021-11-16 NOTE — Progress Notes (Signed)
Office Visit Note  Patient: Brenda Hanna             Date of Birth: 03/17/1941           MRN: [id number]             PCP: Myrlene Broker, MD Referring: Myrlene Broker, MD Visit Date: 11/30/2021 Occupation: '@GUAROCC'$ @  Subjective:  Medication management  History of Present Illness: Brenda Hanna is a 81 y.o. female with history of seropositive rheumatoid arthritis, Sjogren's and osteoarthritis.  She continues to have pain and discomfort in her joints.  She describes discomfort in her bilateral shoulders, bilateral elbows, her knees and her ankles.  She has not noticed any joint swelling.  She continues to have some lower back pain.  She is followed by Dr. Louanne Skye.  She states that he gave her a leg brace which has been relieving her lower back pain.  She continues to dry mouth and dry eye symptoms.  She is on Restasis and using over-the-counter products.  Activities of Daily Living:  Patient reports morning stiffness for 15 minutes.   Patient Reports nocturnal pain.  Difficulty dressing/grooming: Denies Difficulty climbing stairs: Denies Difficulty getting out of chair: Reports Difficulty using hands for taps, buttons, cutlery, and/or writing: Denies  Review of Systems  Constitutional:  Positive for fatigue.  HENT:  Negative for mouth dryness.   Eyes:  Positive for dryness.  Respiratory:  Negative for shortness of breath.   Cardiovascular:  Positive for swelling in legs/feet.  Gastrointestinal:  Positive for constipation.  Endocrine: Positive for cold intolerance and excessive thirst.  Genitourinary:  Negative for difficulty urinating.  Musculoskeletal:  Positive for joint pain, gait problem, joint pain, joint swelling, muscle weakness, morning stiffness and muscle tenderness.  Skin:  Negative for rash.  Allergic/Immunologic: Negative for susceptible to infections.  Neurological:  Positive for numbness and weakness.  Hematological:  Positive for bruising/bleeding  tendency.  Psychiatric/Behavioral:  Positive for sleep disturbance.    PMFS History:  Patient Active Problem List   Diagnosis Date Noted   Rheumatoid arthritis involving multiple sites with positive rheumatoid factor ([place]) 07/14/2016   High risk medication use 07/14/2016   Sjogren's syndrome with keratoconjunctivitis sicca ([place]) 07/14/2016   Raynaud's disease without gangrene 07/14/2016   Achilles tendinitis of both lower extremities 07/14/2016   Fibromyalgia 07/14/2016   Anxiety and depression 07/14/2016   History of asthma 07/14/2016   Environmental allergies 07/14/2016   Gastroesophageal reflux disease without esophagitis 07/14/2016   Neuropathy 07/14/2016   Osteopenia of multiple sites 07/14/2016   Pain in both hands 07/14/2016   Pain in both feet 07/14/2016    Past Medical History:  Diagnosis Date   Back pain    Fibromyalgia    Osteoarthritis    Rheumatoid arteritis ([place])    Ruptured lumbar disc    Sjogren's disease ([place])     Family History  Problem Relation Age of Onset   Heart failure Mother    Neuropathy Father    Heart failure Father    Healthy Daughter    Healthy Daughter    Healthy Son    Past Surgical History:  Procedure Laterality Date   ABDOMINAL HYSTERECTOMY     BLADDER SURGERY     dental implants  2022   Social History   Social History Narrative   Right Handed    Two story home   Immunization History  Administered Date(s) Administered   Influenza, High Dose Seasonal PF 04/06/2017  PFIZER(Purple Top)SARS-COV-2 Vaccination 07/19/2019, 08/09/2019, 06/04/2020, 02/10/2021   Pfizer Covid-19 Vaccine Bivalent Booster 74yr & up 06/02/2021   Zoster Recombinat (Shingrix) 01/31/2018     Objective: Vital Signs: BP (!) 180/90 (BP Location: Left Arm, Patient Position: Sitting, Cuff Size: Small)   Pulse 70   Resp 12   Ht 5' (1.524 m)   Wt 114 lb 9.6 oz (52 kg)   BMI 22.38 kg/m    Physical Exam Vitals and nursing note reviewed.  Constitutional:       Appearance: She is well-developed.  HENT:     Head: Normocephalic and atraumatic.  Eyes:     Conjunctiva/sclera: Conjunctivae normal.  Cardiovascular:     Rate and Rhythm: Normal rate and regular rhythm.     Heart sounds: Normal heart sounds.  Pulmonary:     Effort: Pulmonary effort is normal.     Breath sounds: Normal breath sounds.  Abdominal:     General: Bowel sounds are normal.     Palpations: Abdomen is soft.  Musculoskeletal:     Cervical back: Normal range of motion.  Lymphadenopathy:     Cervical: No cervical adenopathy.  Skin:    General: Skin is warm and dry.     Capillary Refill: Capillary refill takes less than 2 seconds.  Neurological:     Mental Status: She is alert and oriented to person, place, and time.  Psychiatric:        Behavior: Behavior normal.     Musculoskeletal Exam: C-spine was in good range of motion.  Shoulder joints, elbow joints, wrist joints, MCPs PIPs and DIPs with good range of motion with no synovitis.  She had bilateral PIP and DIP thickening.  Hip joints and knee joints in good range of motion.  She had discomfort range of motion of her knee joints without any warmth swelling or effusion.  There was no tenderness over ankles or MTPs.  She had painful range of motion of her lumbar spine.  CDAI Exam: CDAI Score: 1.7  Patient Global: 4 mm; Provider Global: 3 mm Swollen: 0 ; Tender: 1  Joint Exam 11/30/2021      Right  Left  Knee   Tender        Investigation: No additional findings.  Imaging: No results found.  Recent Labs: Lab Results  Component Value Date   WBC 6.8 06/09/2021   HGB 10.6 (L) 06/09/2021   PLT 273 06/09/2021   NA 135 06/09/2021   K 4.6 06/09/2021   CL 99 06/09/2021   CO2 23 06/09/2021   GLUCOSE 103 (H) 06/09/2021   BUN 13 06/09/2021   CREATININE 0.81 06/09/2021   BILITOT 0.4 06/09/2021   ALKPHOS 103 02/22/2017   AST 28 06/09/2021   ALT 13 06/09/2021   PROT 6.6 06/09/2021   PROT 6.6 06/09/2021    ALBUMIN 4.1 02/22/2017   CALCIUM 9.2 06/09/2021   GFRAA 82 12/23/2020    Speciality Comments:  PLQ eye exam normal on 03/15/2021 WNL  CWinnebagof/u  1 year  Procedures:  No procedures performed Allergies: Methotrexate, Contrast media [iodinated contrast media], Penicillins, and Shellfish allergy   Assessment / Plan:     Visit Diagnoses: Rheumatoid arthritis involving multiple sites with positive rheumatoid factor [place] - Positive RF, negative CCP:  -She complains of pain and discomfort in multiple joints.  No synovitis was noted.  Discomfort is mostly coming from underlying osteoarthritis.  Plan: Rheumatoid factor  High risk medication use - Plaquenil 200 mg 1 tablet  by mouth daily. PLQ eye exam normal on 03/15/2021  -Labs obtained on June 09, 2021 were reviewed which were within normal limits.  She was mildly anemic.  We will recheck labs today.  Information about immunization was placed in the AVS.  Plan: CBC with Differential/Platelet, COMPLETE METABOLIC PANEL WITH GFR  Sjogren's syndrome with keratoconjunctivitis sicca ([facility]) - +ANA, +Ro,+RF , sicca: -She is on Restasis.  Over-the-counter products were discussed.  I will obtain additional labs today. -Plan: Urinalysis, Routine w reflex microscopic, Sedimentation rate, C3 and C4, Serum protein electrophoresis with reflex  Primary osteoarthritis of both hands-she had bilateral PIP and DIP thickening with no synovitis.  DDD (degenerative disc disease), lumbar - She is followed by Dr. Louanne Skye.  She continues to have pain and discomfort in her lower back.  She had limited painful range of motion.  She has been using her leg brace which is relieving her back pain per patient.  Chronic SI joint pain  Raynaud's disease without gangrene-she continues to have Raynaud's phenomenon when exposed to the colder temperatures.  Typical Ro which are warm was discussed.  Fibromyalgia -  tramadol for pain relief.  Ambien 10 mg at bedtime for  insomnia.  Osteopenia of multiple sites - Followed by her PCP.   Neuropathy - Diagnosed with peripheral neuropathy by Dr. Posey Pronto on 02/22/2021.  She tried Lyrica and gabapentin but discontinued due to side effects.  Frequent falls-I offered physical therapy which she declined.  She states that she has been doing some exercises at home.  Orders: Orders Placed This Encounter  Procedures   CBC with Differential/Platelet   COMPLETE METABOLIC PANEL WITH GFR   Urinalysis, Routine w reflex microscopic   Sedimentation rate   C3 and C4   Rheumatoid factor   Serum protein electrophoresis with reflex   No orders of the defined types were placed in this encounter.    Follow-Up Instructions: Return in about 5 months (around 05/02/2022) for Rheumatoid arthritis, Sjogren's, Osteoarthritis.   Bo Merino, MD  Note - This record has been created using Editor, commissioning.  Chart creation errors have been sought, but may not always  have been located. Such creation errors do not reflect on  the standard of medical care.

## 2021-11-30 ENCOUNTER — Encounter: Payer: Self-pay | Admitting: Rheumatology

## 2021-11-30 ENCOUNTER — Ambulatory Visit (INDEPENDENT_AMBULATORY_CARE_PROVIDER_SITE_OTHER): Payer: PPO | Admitting: Rheumatology

## 2021-11-30 VITALS — BP 180/90 | HR 70 | Resp 12 | Ht 60.0 in | Wt 114.6 lb

## 2021-11-30 DIAGNOSIS — M3501 Sicca syndrome with keratoconjunctivitis: Secondary | ICD-10-CM | POA: Diagnosis not present

## 2021-11-30 DIAGNOSIS — I7300 Raynaud's syndrome without gangrene: Secondary | ICD-10-CM

## 2021-11-30 DIAGNOSIS — Z79899 Other long term (current) drug therapy: Secondary | ICD-10-CM | POA: Diagnosis not present

## 2021-11-30 DIAGNOSIS — M19041 Primary osteoarthritis, right hand: Secondary | ICD-10-CM | POA: Diagnosis not present

## 2021-11-30 DIAGNOSIS — M19042 Primary osteoarthritis, left hand: Secondary | ICD-10-CM

## 2021-11-30 DIAGNOSIS — G629 Polyneuropathy, unspecified: Secondary | ICD-10-CM

## 2021-11-30 DIAGNOSIS — M5136 Other intervertebral disc degeneration, lumbar region: Secondary | ICD-10-CM

## 2021-11-30 DIAGNOSIS — M8589 Other specified disorders of bone density and structure, multiple sites: Secondary | ICD-10-CM

## 2021-11-30 DIAGNOSIS — M797 Fibromyalgia: Secondary | ICD-10-CM

## 2021-11-30 DIAGNOSIS — R296 Repeated falls: Secondary | ICD-10-CM

## 2021-11-30 DIAGNOSIS — M0579 Rheumatoid arthritis with rheumatoid factor of multiple sites without organ or systems involvement: Secondary | ICD-10-CM

## 2021-11-30 DIAGNOSIS — M533 Sacrococcygeal disorders, not elsewhere classified: Secondary | ICD-10-CM

## 2021-11-30 DIAGNOSIS — G8929 Other chronic pain: Secondary | ICD-10-CM

## 2021-11-30 NOTE — Patient Instructions (Signed)
Standing Labs We placed an order today for your standing lab work.   Please have your standing labs drawn in October  If possible, please have your labs drawn 2 weeks prior to your appointment so that the provider can discuss your results at your appointment.  Please note that you may see your imaging and lab results in [facility] before we have reviewed them. We may be awaiting multiple results to interpret others before contacting you. Please allow our office up to 72 hours to thoroughly review all of the results before contacting the office for clarification of your results.  We have open lab daily: Monday through Thursday from 1:30-4:30 PM and Friday from 1:30-4:00 PM at the office of Dr. Bo Merino, [facility].   Please be advised, all patients with office appointments requiring lab work will take precedent over walk-in lab work.  If possible, please come for your lab work on Monday and Friday afternoons, as you may experience shorter wait times. The office is located at [address], [place], [place], [place] [address] No appointment is necessary.   Labs are drawn by [facility]. Please bring your co-pay at the time of your lab draw.  You may receive a bill from [facility] for your lab work.  Please note if you are on Hydroxychloroquine and and an order has been placed for a Hydroxychloroquine level, you will need to have it drawn 4 hours or more after your last dose.  If you wish to have your labs drawn at another location, please call the office 24 hours in advance to send orders.  If you have any questions regarding directions or hours of operation,  please call [phone].   As a reminder, please drink plenty of water prior to coming for your lab work. Thanks!   Vaccines You are taking a medication(s) that can suppress your immune system.  The following immunizations are recommended: Flu annually Covid-19  Td/Tdap (tetanus, diphtheria, pertussis) every 10  years Pneumonia (Prevnar 15 then Pneumovax 23 at least 1 year apart.  Alternatively, can take Prevnar 20 without needing additional dose) Shingrix: 2 doses from 4 weeks to 6 months apart  Please check with your PCP to make sure you are up to date.

## 2021-12-02 ENCOUNTER — Other Ambulatory Visit: Payer: Self-pay | Admitting: Physician Assistant

## 2021-12-02 DIAGNOSIS — Z79899 Other long term (current) drug therapy: Secondary | ICD-10-CM

## 2021-12-02 DIAGNOSIS — M0579 Rheumatoid arthritis with rheumatoid factor of multiple sites without organ or systems involvement: Secondary | ICD-10-CM

## 2021-12-02 LAB — COMPLETE METABOLIC PANEL WITH GFR
AG Ratio: 1.7 (calc) (ref 1.0–2.5)
ALT: 9 U/L (ref 6–29)
AST: 23 U/L (ref 10–35)
Albumin: 4.3 g/dL (ref 3.6–5.1)
Alkaline phosphatase (APISO): 85 U/L (ref 37–153)
BUN: 10 mg/dL (ref 7–25)
CO2: 27 mmol/L (ref 20–32)
Calcium: 9.4 mg/dL (ref 8.6–10.4)
Chloride: 99 mmol/L (ref 98–110)
Creat: 0.92 mg/dL (ref 0.60–1.00)
Globulin: 2.6 g/dL (calc) (ref 1.9–3.7)
Glucose, Bld: 98 mg/dL (ref 65–99)
Potassium: 4.2 mmol/L (ref 3.5–5.3)
Sodium: 136 mmol/L (ref 135–146)
Total Bilirubin: 0.3 mg/dL (ref 0.2–1.2)
Total Protein: 6.9 g/dL (ref 6.1–8.1)
eGFR: 64 mL/min/{1.73_m2} (ref >=60)

## 2021-12-02 LAB — CBC WITH DIFFERENTIAL/PLATELET
Absolute Monocytes: 437 cells/uL (ref 200–950)
Basophils Absolute: 52 cells/uL (ref 0–200)
Basophils Relative: 1 %
Eosinophils Absolute: 510 cells/uL — ABNORMAL HIGH (ref 15–500)
Eosinophils Relative: 9.8 %
HCT: 33.6 % — ABNORMAL LOW (ref 35.0–45.0)
Hemoglobin: 11.4 g/dL — ABNORMAL LOW (ref 11.7–15.5)
Lymphs Abs: 1758 cells/uL (ref 850–3900)
MCH: 33.4 pg — ABNORMAL HIGH (ref 27.0–33.0)
MCHC: 33.9 g/dL (ref 32.0–36.0)
MCV: 98.5 fL (ref 80.0–100.0)
MPV: 10.1 fL (ref 7.5–12.5)
Monocytes Relative: 8.4 %
Neutro Abs: 2444 cells/uL (ref 1500–7800)
Neutrophils Relative %: 47 %
Platelets: 295 10*3/uL (ref 140–400)
RBC: 3.41 10*6/uL — ABNORMAL LOW (ref 3.80–5.10)
RDW: 12 % (ref 11.0–15.0)
Total Lymphocyte: 33.8 %
WBC: 5.2 10*3/uL (ref 3.8–10.8)

## 2021-12-02 LAB — URINALYSIS, ROUTINE W REFLEX MICROSCOPIC
Bilirubin Urine: NEGATIVE
Glucose, UA: NEGATIVE
Hgb urine dipstick: NEGATIVE
Ketones, ur: NEGATIVE
Leukocytes,Ua: NEGATIVE
Nitrite: NEGATIVE
Protein, ur: NEGATIVE
Specific Gravity, Urine: 1.008 (ref 1.001–1.035)
pH: 5.5 (ref 5.0–8.0)

## 2021-12-02 LAB — PROTEIN ELECTROPHORESIS, SERUM, WITH REFLEX
Albumin ELP: 4.1 g/dL (ref 3.8–4.8)
Alpha 1: 0.3 g/dL (ref 0.2–0.3)
Alpha 2: 0.7 g/dL (ref 0.5–0.9)
Beta 2: 0.3 g/dL (ref 0.2–0.5)
Beta Globulin: 0.5 g/dL (ref 0.4–0.6)
Gamma Globulin: 0.8 g/dL (ref 0.8–1.7)
Total Protein: 6.7 g/dL (ref 6.1–8.1)

## 2021-12-02 LAB — C3 AND C4
C3 Complement: 119 mg/dL (ref 83–193)
C4 Complement: 9 mg/dL — ABNORMAL LOW (ref 15–57)

## 2021-12-02 LAB — SEDIMENTATION RATE: Sed Rate: 9 mm/h (ref 0–30)

## 2021-12-02 LAB — RHEUMATOID FACTOR: Rheumatoid fact SerPl-aCnc: 685 IU/mL — ABNORMAL HIGH (ref <14)

## 2021-12-02 NOTE — Progress Notes (Signed)
CBC normal except anemia which is stable.  CMP normal.  Sed rate normal.  UA negative, SPEP normal, rheumatoid factor positive, C4 low.  Labs do not indicate a disease flare.  No change in treatment advised.

## 2021-12-02 NOTE — Telephone Encounter (Signed)
Next Visit: 05/05/2022  Last Visit: 11/30/2021  Labs: 11/30/2021 CBC normal except anemia which is stable.  CMP normal.   Eye exam:  03/15/2021 WNL   Current Dose per office note 11/30/2021:  Plaquenil 200 mg 1 tablet by mouth daily  EJ:[id number] arthritis involving multiple sites with positive rheumatoid factor   Last Fill: 06/29/2021  Okay to refill Plaquenil?

## 2021-12-18 ENCOUNTER — Emergency Department (HOSPITAL_COMMUNITY): Payer: PPO

## 2021-12-18 ENCOUNTER — Emergency Department (HOSPITAL_COMMUNITY)
Admission: EM | Admit: 2021-12-18 | Discharge: 2021-12-18 | Disposition: A | Discharge status: Home / Self Care | Payer: PPO | Attending: Emergency Medicine | Admitting: Emergency Medicine

## 2021-12-18 ENCOUNTER — Other Ambulatory Visit: Payer: Self-pay

## 2021-12-18 ENCOUNTER — Encounter (HOSPITAL_COMMUNITY): Payer: Self-pay

## 2021-12-18 DIAGNOSIS — I10 Essential (primary) hypertension: Secondary | ICD-10-CM | POA: Insufficient documentation

## 2021-12-18 DIAGNOSIS — E860 Dehydration: Secondary | ICD-10-CM | POA: Insufficient documentation

## 2021-12-18 DIAGNOSIS — Z8619 Personal history of other infectious and parasitic diseases: Secondary | ICD-10-CM | POA: Insufficient documentation

## 2021-12-18 DIAGNOSIS — Z20822 Contact with and (suspected) exposure to covid-19: Secondary | ICD-10-CM | POA: Insufficient documentation

## 2021-12-18 DIAGNOSIS — Z79899 Other long term (current) drug therapy: Secondary | ICD-10-CM | POA: Diagnosis not present

## 2021-12-18 DIAGNOSIS — M069 Rheumatoid arthritis, unspecified: Secondary | ICD-10-CM | POA: Diagnosis not present

## 2021-12-18 DIAGNOSIS — R519 Headache, unspecified: Secondary | ICD-10-CM

## 2021-12-18 DIAGNOSIS — Z79620 Long term (current) use of immunosuppressive biologic: Secondary | ICD-10-CM | POA: Diagnosis not present

## 2021-12-18 DIAGNOSIS — K5900 Constipation, unspecified: Secondary | ICD-10-CM | POA: Diagnosis not present

## 2021-12-18 DIAGNOSIS — R1030 Lower abdominal pain, unspecified: Secondary | ICD-10-CM

## 2021-12-18 LAB — URINALYSIS, ROUTINE W REFLEX MICROSCOPIC
Bacteria, UA: NONE SEEN
Bilirubin Urine: NEGATIVE
Glucose, UA: NEGATIVE mg/dL
Hgb urine dipstick: NEGATIVE
Ketones, ur: NEGATIVE mg/dL
Leukocytes,Ua: NEGATIVE
Nitrite: NEGATIVE
Protein, ur: 30 mg/dL — AB
Specific Gravity, Urine: 1.013 (ref 1.005–1.030)
pH: 5 (ref 5.0–8.0)

## 2021-12-18 LAB — CBC WITH DIFFERENTIAL/PLATELET
Abs Immature Granulocytes: 0.03 10*3/uL (ref 0.00–0.07)
Basophils Absolute: 0 10*3/uL (ref 0.0–0.1)
Basophils Relative: 0 %
Eosinophils Absolute: 0.1 10*3/uL (ref 0.0–0.5)
Eosinophils Relative: 1 %
HCT: 35.5 % — ABNORMAL LOW (ref 36.0–46.0)
Hemoglobin: 12.1 g/dL (ref 12.0–15.0)
Immature Granulocytes: 0 %
Lymphocytes Relative: 14 %
Lymphs Abs: 1.6 10*3/uL (ref 0.7–4.0)
MCH: 32.5 pg (ref 26.0–34.0)
MCHC: 34.1 g/dL (ref 30.0–36.0)
MCV: 95.4 fL (ref 80.0–100.0)
Monocytes Absolute: 0.8 10*3/uL (ref 0.1–1.0)
Monocytes Relative: 7 %
Neutro Abs: 8.8 10*3/uL — ABNORMAL HIGH (ref 1.7–7.7)
Neutrophils Relative %: 78 %
Platelets: 380 10*3/uL (ref 150–400)
RBC: 3.72 MIL/uL — ABNORMAL LOW (ref 3.87–5.11)
RDW: 12.4 % (ref 11.5–15.5)
WBC: 11.3 10*3/uL — ABNORMAL HIGH (ref 4.0–10.5)
nRBC: 0 % (ref 0.0–0.2)

## 2021-12-18 LAB — TROPONIN I (HIGH SENSITIVITY): Troponin I (High Sensitivity): 14 ng/L (ref <18)

## 2021-12-18 LAB — PROTIME-INR
INR: 1 (ref 0.8–1.2)
Prothrombin Time: 13.3 seconds (ref 11.4–15.2)

## 2021-12-18 LAB — COMPREHENSIVE METABOLIC PANEL
ALT: 13 U/L (ref 0–44)
AST: 26 U/L (ref 15–41)
Albumin: 3.5 g/dL (ref 3.5–5.0)
Alkaline Phosphatase: 97 U/L (ref 38–126)
Anion gap: 9 (ref 5–15)
BUN: 14 mg/dL (ref 8–23)
CO2: 25 mmol/L (ref 22–32)
Calcium: 9 mg/dL (ref 8.9–10.3)
Chloride: 98 mmol/L (ref 98–111)
Creatinine, Ser: 0.95 mg/dL (ref 0.44–1.00)
GFR, Estimated: >60 mL/min (ref >60)
Glucose, Bld: 111 mg/dL — ABNORMAL HIGH (ref 70–99)
Potassium: 4.3 mmol/L (ref 3.5–5.1)
Sodium: 132 mmol/L — ABNORMAL LOW (ref 135–145)
Total Bilirubin: 1.3 mg/dL — ABNORMAL HIGH (ref 0.3–1.2)
Total Protein: 6.4 g/dL — ABNORMAL LOW (ref 6.5–8.1)

## 2021-12-18 LAB — LIPASE, BLOOD: Lipase: 31 U/L (ref 11–51)

## 2021-12-18 LAB — RESP PANEL BY RT-PCR (FLU A&B, COVID) ARPGX2
Influenza A by PCR: NEGATIVE
Influenza B by PCR: NEGATIVE
SARS Coronavirus 2 by RT PCR: NEGATIVE

## 2021-12-18 LAB — PHOSPHORUS: Phosphorus: 3.4 mg/dL (ref 2.5–4.6)

## 2021-12-18 LAB — MAGNESIUM: Magnesium: 1.9 mg/dL (ref 1.7–2.4)

## 2021-12-18 LAB — TSH: TSH: 3.03 u[IU]/mL (ref 0.350–4.500)

## 2021-12-18 MED ORDER — AMLODIPINE BESYLATE 5 MG PO TABS
10.0000 mg | ORAL_TABLET | Freq: Once | ORAL | Status: AC
  Administered 2021-12-18: 10 mg via ORAL
  Filled 2021-12-18: qty 2

## 2021-12-18 MED ORDER — ONDANSETRON 4 MG PO TBDP: 4.0000 mg | ORAL_TABLET | ORAL | 0 refills | Status: DC | PRN

## 2021-12-18 MED ORDER — HYDRALAZINE HCL 20 MG/ML IJ SOLN
5.0000 mg | Freq: Once | INTRAMUSCULAR | Status: AC
Start: 2021-12-18 — End: 2021-12-18
  Administered 2021-12-18: 5 mg via INTRAVENOUS
  Filled 2021-12-18: qty 1

## 2021-12-18 MED ORDER — ONDANSETRON HCL 4 MG/2ML IJ SOLN
4.0000 mg | Freq: Once | INTRAMUSCULAR | Status: AC
Start: 2021-12-18 — End: 2021-12-18
  Administered 2021-12-18: 4 mg via INTRAVENOUS
  Filled 2021-12-18: qty 2

## 2021-12-18 MED ORDER — SODIUM CHLORIDE 0.9 % IV BOLUS
500.0000 mL | Freq: Once | INTRAVENOUS | Status: AC
  Administered 2021-12-18: 500 mL via INTRAVENOUS

## 2021-12-18 MED ORDER — CLONIDINE HCL 0.1 MG PO TABS
0.1000 mg | ORAL_TABLET | Freq: Once | ORAL | Status: DC
  Filled 2021-12-18: qty 1

## 2021-12-18 MED ORDER — POLYETHYLENE GLYCOL 3350 17 G PO PACK
17.0000 g | PACK | Freq: Every day | ORAL | 2 refills | Status: DC
Start: 2021-12-18 — End: 2023-01-12

## 2021-12-18 MED ORDER — ACETAMINOPHEN 500 MG PO TABS
1000.0000 mg | ORAL_TABLET | Freq: Once | ORAL | Status: AC
  Administered 2021-12-18: 1000 mg via ORAL
  Filled 2021-12-18: qty 2

## 2021-12-18 MED ORDER — MAGNESIUM CITRATE PO SOLN: 1.0000 | Freq: Once | ORAL | 0 refills | Status: AC

## 2021-12-18 MED ORDER — AMLODIPINE BESYLATE 5 MG PO TABS: 5.0000 mg | ORAL_TABLET | Freq: Every day | ORAL | 0 refills | Status: DC

## 2021-12-18 NOTE — ED Provider Notes (Signed)
[facility] EMERGENCY DEPARTMENT Provider Note   CSN: [id number] Arrival date & time: 12/18/21  7371     History  Chief Complaint  Patient presents with   Emesis    Brenda Hanna is a 81 y.o. female.  HPI Patient has not felt well for several weeks.  She had been diagnosed with [facility] spotted fever.  She completed a 10-day course of doxycycline.  She has persisted in having a frontal headache aching in quality.  No fever no neck stiffness.  No photophobia.  No visual changes.  Patient reports she has been nauseated frequently.  She has not had active vomiting.  She has felt generally weak.  No focal weakness numbness or tingling of extremities.  She has had abdominal pain.  Generally central in nature.  She denies pain burning urgency with urination.  She has been constipated with no bowel movement for about 3 days.  Patient's had very little oral intake.  She reports she had loss of appetite and unintentional weight loss in the past several weeks.  History is significant for rheumatoid arthritis on Plaquenil.  No apparent history of hypertension.  Patient was seen by her PCP twice in the last couple weeks.  No mention was made of elevated blood pressures.    Home Medications Prior to Admission medications   Medication Sig Start Date End Date Taking? Authorizing Provider  albuterol (PROVENTIL HFA;VENTOLIN HFA) 108 (90 Base) MCG/ACT inhaler Inhale into the lungs as needed.  03/21/18  Yes [provider]  albuterol (PROVENTIL) (2.5 MG/3ML) 0.083% nebulizer solution USE 1 VIAL IN NEBULIZER TWICE DAILY 07/03/21  Yes [provider]  amLODipine (NORVASC) 5 MG tablet Take 1 tablet (5 mg total) by mouth daily. 12/18/21  Yes Charlesetta Shanks, MD  Ascorbic Acid (VITAMIN C) 1000 MG tablet Take 1,000 mg by mouth daily.   Yes [provider]  Biotin 10 MG CAPS Take 10 mg by mouth daily.   Yes [provider]  CALCIUM-MAGNESIUM-ZINC PO Take  1 tablet by mouth daily.   Yes [provider]  cetirizine (ZYRTEC) 10 MG tablet Take 10 mg by mouth daily.   Yes [provider]  chlorhexidine (PERIDEX) 0.12 % solution 15 mL by Mouth route Two (2) times a day. 05/14/20  Yes [provider]  doxycycline (VIBRAMYCIN) 100 MG capsule Take 100 mg by mouth 2 (two) times daily.   Yes [provider]  famotidine (PEPCID) 40 MG tablet Take by mouth daily as needed.  07/28/18  Yes [provider]  hydroxychloroquine (PLAQUENIL) 200 MG tablet Take 1 tablet (200 mg total) by mouth daily. 12/02/21  Yes Ofilia Neas, PA-C  levothyroxine (SYNTHROID, LEVOTHROID) 75 MCG tablet Take 75 mcg by mouth daily before breakfast.  07/05/16  Yes [provider]  LORazepam (ATIVAN) 1 MG tablet Take 1 mg by mouth every 6 (six) hours as needed for anxiety. 02/20/17  Yes [provider]  magnesium citrate SOLN Take 296 mLs (1 Bottle total) by mouth once for 1 dose. 12/18/21 12/18/21 Yes Charlesetta Shanks, MD  Naproxen Sodium (ALEVE PO) as needed.   Yes [provider]  omeprazole (PRILOSEC) 20 MG capsule Take 20 mg by mouth 2 (two) times daily before a meal.  07/05/16  Yes [provider]  ondansetron (ZOFRAN-ODT) 4 MG disintegrating tablet Take 1 tablet (4 mg total) by mouth every 4 (four) hours as needed for nausea or vomiting. 12/18/21  Yes Charlesetta Shanks, MD  polyethylene glycol (MIRALAX / GLYCOLAX) 17 g packet Take 17 g by mouth daily. 12/18/21  Yes Demarko Zeimet, Jeannie Done, MD  RESTASIS 0.05 % ophthalmic emulsion Place into both eyes daily.  12/01/16  Yes [provider]  traMADol (ULTRAM) 50 MG tablet TAKE 1 TABLET BY MOUTH EVERY 6 HOURS AS NEEDED Patient taking differently: Take 50 mg by mouth every 6 (six) hours as needed for moderate pain or severe pain. 11/04/21  Yes Jessy Oto, MD  vitamin B-12 (CYANOCOBALAMIN) 1000 MCG tablet Vitamin B-12   Yes [provider]  vitamin E 400 UNIT  capsule Take by mouth.   Yes [provider]  zolpidem (AMBIEN) 10 MG tablet Take 1 tablet by mouth at bedtime. 05/28/20  Yes [provider]      Allergies    Methotrexate, Contrast media [iodinated contrast media], Penicillins, and Shellfish allergy    Review of Systems   Review of Systems 10 systems reviewed negative except as per HPI Physical Exam Updated Vital Signs BP (!) 150/86   Pulse 78   Temp 98 F (36.7 C) (Oral)   Resp (!) 24   SpO2 98%  Physical Exam Constitutional:      Comments: Thin but well-nourished well-developed.  No respiratory distress.  Clear mental status.  Nontoxic  HENT:     Head: Normocephalic and atraumatic.     Nose: Nose normal.     Mouth/Throat:     Mouth: Mucous membranes are moist.     Pharynx: Oropharynx is clear.  Eyes:     Extraocular Movements: Extraocular movements intact.     Conjunctiva/sclera: Conjunctivae normal.     Pupils: Pupils are equal, round, and reactive to light.  Cardiovascular:     Rate and Rhythm: Normal rate and regular rhythm.  Pulmonary:     Effort: Pulmonary effort is normal.     Breath sounds: Normal breath sounds.  Abdominal:     Comments: Abdomen soft.  Moderate central lower abdominal tenderness.  No palpable mass.  No guarding.  Musculoskeletal:        General: No swelling or tenderness. Normal range of motion.     Cervical back: Neck supple.     Right lower leg: No edema.     Left lower leg: No edema.  Skin:    General: Skin is warm and dry.     Findings: No rash.  Neurological:     General: No focal deficit present.     Mental Status: She is oriented to person, place, and time.     Sensory: No sensory deficit.     Motor: No weakness.  Psychiatric:        Mood and Affect: Mood normal.     ED Results / Procedures / Treatments   Labs (all labs ordered are listed, but only abnormal results are displayed) Labs Reviewed  COMPREHENSIVE METABOLIC PANEL - Abnormal; Notable for the  following components:      Result Value   Sodium 132 (*)    Glucose, Bld 111 (*)    Total Protein 6.4 (*)    Total Bilirubin 1.3 (*)    All other components within normal limits  CBC WITH DIFFERENTIAL/PLATELET - Abnormal; Notable for the following components:   WBC 11.3 (*)    RBC 3.72 (*)    HCT 35.5 (*)    Neutro Abs 8.8 (*)    All other components within normal limits  URINALYSIS, ROUTINE W REFLEX MICROSCOPIC - Abnormal; Notable for the following  components:   Protein, ur 30 (*)    All other components within normal limits  RESP PANEL BY RT-PCR (FLU A&B, COVID) ARPGX2  LIPASE, BLOOD  PROTIME-INR  MAGNESIUM  PHOSPHORUS  TSH  TROPONIN I (HIGH SENSITIVITY)  TROPONIN I (HIGH SENSITIVITY)    EKG EKG Interpretation  Date/Time:  Saturday December 18 2021 12:36:17 EDT Ventricular Rate:  86 PR Interval:  157 QRS Duration: 90 QT Interval:  383 QTC Calculation: 459 R Axis:   74 Text Interpretation: Sinus rhythm Borderline T abnormalities, anterior leads no sig change from previous Confirmed by Charlesetta Shanks [phone]) on 12/18/2021 4:05:35 PM  Radiology CT Abdomen Pelvis Wo Contrast  Result Date: 12/18/2021 CLINICAL DATA:  Abdominal pain and nausea for several days. EXAM: CT ABDOMEN AND PELVIS WITHOUT CONTRAST TECHNIQUE: Multidetector CT imaging of the abdomen and pelvis was performed following the standard protocol without IV contrast. RADIATION DOSE REDUCTION: This exam was performed according to the departmental dose-optimization program which includes automated exposure control, adjustment of the mA and/or kV according to patient size and/or use of iterative reconstruction technique. COMPARISON:  None Available. FINDINGS: Lower chest: No acute findings. Hepatobiliary: No mass visualized on this unenhanced exam. Gallbladder is unremarkable. No evidence of biliary ductal dilatation. Pancreas: No mass or inflammatory process visualized on this unenhanced exam. Spleen:  Within normal  limits in size. Adrenals/Urinary tract: No evidence of urolithiasis or hydronephrosis. Fluid attenuation cyst noted in upper pole of left kidney insert follow-up. Unremarkable unopacified urinary bladder. Stomach/Bowel: Large stool burden noted throughout the colon. Mild wall thickening and pericolonic soft tissue stranding is seen involving the descending colon, consistent with mild colitis. Sigmoid diverticulosis is noted, however there is no evidence of diverticulitis. Vascular/Lymphatic: No pathologically enlarged lymph nodes identified. No evidence of abdominal aortic aneurysm. Aortic atherosclerotic calcification incidentally noted. Reproductive: Prior hysterectomy noted. Adnexal regions are unremarkable in appearance. Other: Small amount of free fluid seen mainly in the pelvic cul-de-sac. Musculoskeletal: No suspicious bone lesions identified. Severe degenerative disc disease noted at L3-4 and L4-5. IMPRESSION: Mild colitis involving the descending colon. Large stool burden noted; recommend clinical correlation for possible constipation. Small amount of free fluid in pelvic cul-de-sac. No evidence of urolithiasis or hydronephrosis. Sigmoid diverticulosis, without radiographic evidence of diverticulitis. Electronically Signed   By: Marlaine Hind M.D.   On: 12/18/2021 14:13   CT Head Wo Contrast  Result Date: 12/18/2021 CLINICAL DATA:  81 year old female altered mental status. EXAM: CT HEAD WITHOUT CONTRAST TECHNIQUE: Contiguous axial images were obtained from the base of the skull through the vertex without intravenous contrast. RADIATION DOSE REDUCTION: This exam was performed according to the departmental dose-optimization program which includes automated exposure control, adjustment of the mA and/or kV according to patient size and/or use of iterative reconstruction technique. COMPARISON:  None Available. FINDINGS: Brain: Mild ventricular prominence is probably ex vacuo, overall cerebral volume appears  within normal limits for age. No evidence of transependymal edema. No midline shift, mass effect, evidence of mass lesion, intracranial hemorrhage or evidence of cortically based acute infarction. Patchy bilateral mostly anterior cerebral white matter hypodensity, some deep white matter capsule involvement. No cortical encephalomalacia identified. Vascular: Calcified atherosclerosis at the skull base. No suspicious intracranial vascular hyperdensity. Skull: Osteopenia.  No acute osseous abnormality identified. Sinuses/Orbits: Tympanic cavities, Visualized paranasal sinuses and mastoids are clear. Other: No acute orbit or scalp soft tissue findings. IMPRESSION: 1. No acute intracranial abnormality. 2. Mild to moderate for age cerebral white matter changes, most commonly due to chronic  small vessel disease. Electronically Signed   By: Genevie Ann M.D.   On: 12/18/2021 10:11    Procedures Procedures    Medications Ordered in ED Medications  cloNIDine (CATAPRES) tablet 0.1 mg (0 mg Oral Hold 12/18/21 1219)  sodium chloride 0.9 % bolus 500 mL (0 mLs Intravenous Stopped 12/18/21 1132)  ondansetron (ZOFRAN) injection 4 mg (4 mg Intravenous Given 12/18/21 1218)  acetaminophen (TYLENOL) tablet 1,000 mg (1,000 mg Oral Given 12/18/21 1218)  amLODipine (NORVASC) tablet 10 mg (10 mg Oral Given 12/18/21 1529)  hydrALAZINE (APRESOLINE) injection 5 mg (5 mg Intravenous Given 12/18/21 1529)    ED Course/ Medical Decision Making/ A&P                           Medical Decision Making Amount and/or Complexity of Data Reviewed Labs: ordered. Radiology: ordered.  Risk OTC drugs. Prescription drug management.   Patient presents with several symptoms.  Patient seems most concerned with abdominal pain.  She had a severe episode of abdominal pain earlier today but has been having problems with constipation for a number of days.  She also reports she had a frontal headache.  Family members noted that this morning when she  had headache and abdominal pain she seemed mildly confused and was having trouble completing her thoughts.  Patient was hypertensive on arrival.  Patient reports her blood pressures not typically significantly hypertensive.  She is not on a regular blood pressure medications.  At this time from perspective of abdominal pain we will proceed with abdominal pain diagnostic evaluation including CT scan.  Patient has had poor oral intake and appears to have some dehydration, will rehydrate.  Patient was diagnosed with RMSF several weeks ago and finished doxycycline.  She has been having a waxing and waning frontal headache.  At this time no signs of untreated [facility] spotted fever.  The patient is alert and appropriate.  She has no rash.  She does not clinically appear unwell.  We will proceed with CT head in the setting of hypertension.   Patient is rehydrated with normal saline and tolerating oral intake.  Blood pressures are somewhat labile ranging from 180s to 200.  At this point patient is given hydralazine 5 mg IV and a 5 mg amlodipine dose.  Patient has tolerated this well.  She is up and ambulatory and clinically well in appearance.  CT scan of the head reviewed by radiology does not show acute findings.  CT abdomen shows significant constipation and small amount of colitis.  At this time I do not suspect infectious colitis.  Patient clinically is well and has been suffering from constipation.  At this time we will plan to treat constipation with MiraLAX and magnesium citrate if needed.  Urinalysis negative without signs of UTI.  At this time with patient of ambulatory and looking well we will proceed with plan for home management of constipation and initiating low-dose amlodipine with close home monitoring.  Careful return precautions reviewed.        Final Clinical Impression(s) / ED Diagnoses Final diagnoses:  Dehydration  Lower abdominal pain  Constipation, unspecified constipation  type  Hypertension, unspecified type  Acute nonintractable headache, unspecified headache type    Rx / DC Orders ED Discharge Orders          Ordered    amLODipine (NORVASC) 5 MG tablet  Daily        12/18/21 1559  polyethylene glycol (MIRALAX / GLYCOLAX) 17 g packet  Daily        12/18/21 1559    magnesium citrate SOLN   Once        12/18/21 1559    ondansetron (ZOFRAN-ODT) 4 MG disintegrating tablet  Every 4 hours PRN        12/18/21 1559              Charlesetta Shanks, MD 12/18/21 1614

## 2021-12-18 NOTE — ED Triage Notes (Signed)
Patient here with several days of decreased appetite with abdominal pain and nausea. Has been checked for [place] spotted fever after a tick bite and test inconclusive. Spouse states no BM x 3 days but little intake. Complains of headache

## 2021-12-18 NOTE — ED Notes (Signed)
Patient refusing to keep BP cuff on due to pain when it pumps up.  Patient tolerated IV start poorly and husband requesting someone else stick patient even though IV was obtained.  Phlebotomy notified of need for blood draw.

## 2021-12-18 NOTE — Discharge Instructions (Signed)
1.  Take MiraLAX daily until you are having regular bowel movement.  You may also take a dose of magnesium citrate occasionally.  As you are very constipated at this time, try magnesium citrate half a bottle.  If you have not had a bowel movement within 6 hours, drink the second half of the bottle.  Should take a daily stool softener such as Colace which you can buy over-the-counter. 2.  For pain control try to use extra strength Tylenol.  For additional pain control add your prescribed tramadol 1 to 2 tablets.  Tramadol may be contributing to constipation.  However, with your rheumatoid arthritis if you require additional pain medication you may get improved pain control by combining tramadol with Tylenol. 3.  Your blood pressure was elevated in the emergency department.  At this time I suspect your blood pressure is elevated secondary to abdominal pain and hypertension.  You are being started on a medication called amlodipine.  You will start with one 5 mg tablet daily.  Monitor your blood pressures carefully at home and keep a journal.  Measure your blood pressures 2-3 times a day and write them down.  You will need to review this with your doctor ideally within the next 3 to 5 days. 4.  Return to the emergency department immediately if you have confusion, changes in your vision, off balance or weakness or numbness in an arm or leg, severe headache or other concerning symptoms.  Do not delay returning if you are having any of these types of symptoms.

## 2021-12-18 NOTE — ED Notes (Signed)
Family refusing anymore sticks for blood draw.  Daughter is aggressive about attempting to get blood.. MD notified. MD to speak with patient and family.  Family seems think she is too dehydrated to get blood from.

## 2021-12-18 NOTE — ED Notes (Signed)
Got patient into a gown on the monitor got patient a warm blanket patient is resting with family at bedside and call bell in reach

## 2021-12-22 DIAGNOSIS — I491 Atrial premature depolarization: Secondary | ICD-10-CM

## 2021-12-28 HISTORY — PX: OTHER SURGICAL HISTORY: SHX169

## 2022-04-22 NOTE — Progress Notes (Deleted)
Office Visit Note  Patient: Brenda Hanna             Date of Birth: 04/30/1941           MRN: [id number]             PCP: Myrlene Broker, MD Referring: Myrlene Broker, MD Visit Date: 05/05/2022 Occupation: '@GUAROCC'$ @  Subjective:    History of Present Illness: Brenda Hanna is a 81 y.o. female with history of seropositive rheumatoid arthritis, sjogren's syndrome, and osteoarthritis.  She is taking plaquenil 200 mg 1 tablet by mouth daily.   CBC and CMP updated on 04/18/22.   PLQ eye exam normal on 03/15/2021 [facility] [place] f/u 1 year.   Activities of Daily Living:  Patient reports morning stiffness for *** {minute/hour:19697}.   Patient {ACTIONS;DENIES/REPORTS:21021675::"Denies"} nocturnal pain.  Difficulty dressing/grooming: {ACTIONS;DENIES/REPORTS:21021675::"Denies"} Difficulty climbing stairs: {ACTIONS;DENIES/REPORTS:21021675::"Denies"} Difficulty getting out of chair: {ACTIONS;DENIES/REPORTS:21021675::"Denies"} Difficulty using hands for taps, buttons, cutlery, and/or writing: {ACTIONS;DENIES/REPORTS:21021675::"Denies"}  No Rheumatology ROS completed.   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[place]) 07/14/2016   Raynaud's disease without gangrene 07/14/2016   Achilles tendinitis of both lower extremities 07/14/2016   Fibromyalgia 07/14/2016   Anxiety and depression 07/14/2016   History of asthma 07/14/2016   Environmental allergies 07/14/2016   Gastroesophageal reflux disease without esophagitis 07/14/2016   Neuropathy 07/14/2016   Osteopenia of multiple sites 07/14/2016   Pain in both hands 07/14/2016   Pain in both feet 07/14/2016    Past Medical History:  Diagnosis Date   Back pain    Fibromyalgia    Osteoarthritis    Rheumatoid  arteritis ([place])    Ruptured lumbar disc    Sjogren's disease ([place])     Family History  Problem Relation Age of Onset   Heart failure Mother    Neuropathy Father    Heart failure Father    Healthy Daughter    Healthy Daughter    Healthy Son    Past Surgical History:  Procedure Laterality Date   ABDOMINAL HYSTERECTOMY     BLADDER SURGERY     dental implants  2022   Social History   Social History Narrative   Right Handed    Two story home   Immunization History  Administered Date(s) Administered   Influenza, High Dose Seasonal PF 04/06/2017   PFIZER(Purple Top)SARS-COV-2 Vaccination 07/19/2019, 08/09/2019, 06/04/2020, 02/10/2021   Pfizer Covid-19 Vaccine Bivalent Booster 24yr & up 06/02/2021   Zoster Recombinat (Shingrix) 01/31/2018     Objective: Vital Signs: There were no vitals taken for this visit.   Physical Exam Vitals and nursing note reviewed.  Constitutional:      Appearance: She is well-developed.  HENT:     Head: Normocephalic and atraumatic.  Eyes:     Conjunctiva/sclera: Conjunctivae normal.  Cardiovascular:     Rate and Rhythm: Normal rate and regular rhythm.     Heart sounds: Normal heart sounds.  Pulmonary:     Effort: Pulmonary effort is normal.     Breath sounds: Normal breath sounds.  Abdominal:     General: Bowel sounds are normal.     Palpations: Abdomen is soft.  Musculoskeletal:     Cervical back: Normal range of motion.  Skin:    General: Skin is warm and dry.  Capillary Refill: Capillary refill takes less than 2 seconds.  Neurological:     Mental Status: She is alert and oriented to person, place, and time.  Psychiatric:        Behavior: Behavior normal.      Musculoskeletal Exam: ***  CDAI Exam: CDAI Score: -- Patient Global: --; Provider Global: -- Swollen: --; Tender: -- Joint Exam 05/05/2022   No joint exam has been documented for this visit   There is currently no information documented on the homunculus. Go to  the Rheumatology activity and complete the homunculus joint exam.  Investigation: No additional findings.  Imaging: No results found.  Recent Labs: Lab Results  Component Value Date   WBC 11.3 (H) 12/18/2021   HGB 12.1 12/18/2021   PLT 380 12/18/2021   NA 132 (L) 12/18/2021   K 4.3 12/18/2021   CL 98 12/18/2021   CO2 25 12/18/2021   GLUCOSE 111 (H) 12/18/2021   BUN 14 12/18/2021   CREATININE 0.95 12/18/2021   BILITOT 1.3 (H) 12/18/2021   ALKPHOS 97 12/18/2021   AST 26 12/18/2021   ALT 13 12/18/2021   PROT 6.4 (L) 12/18/2021   ALBUMIN 3.5 12/18/2021   CALCIUM 9.0 12/18/2021   GFRAA 82 12/23/2020    Speciality Comments:  PLQ eye exam normal on 03/15/2021 WNL  [place] f/u  1 year  Procedures:  No procedures performed Allergies: Methotrexate, Contrast media [iodinated contrast media], Penicillins, and Shellfish allergy   Assessment / Plan:     Visit Diagnoses: No diagnosis found.  Orders: No orders of the defined types were placed in this encounter.  No orders of the defined types were placed in this encounter.   Face-to-face time spent with patient was *** minutes. Greater than 50% of time was spent in counseling and coordination of care.  Follow-Up Instructions: No follow-ups on file.   Earnestine Mealing, CMA  Note - This record has been created using Editor, commissioning.  Chart creation errors have been sought, but may not always  have been located. Such creation errors do not reflect on  the standard of medical care.

## 2022-05-04 HISTORY — PX: COLOSTOMY REVERSAL: SHX5782

## 2022-05-05 ENCOUNTER — Ambulatory Visit: Payer: PPO | Attending: Physician Assistant | Admitting: Physician Assistant

## 2022-05-05 DIAGNOSIS — M19042 Primary osteoarthritis, left hand: Secondary | ICD-10-CM

## 2022-05-05 DIAGNOSIS — R296 Repeated falls: Secondary | ICD-10-CM

## 2022-05-05 DIAGNOSIS — M797 Fibromyalgia: Secondary | ICD-10-CM

## 2022-05-05 DIAGNOSIS — M8589 Other specified disorders of bone density and structure, multiple sites: Secondary | ICD-10-CM

## 2022-05-05 DIAGNOSIS — G629 Polyneuropathy, unspecified: Secondary | ICD-10-CM

## 2022-05-05 DIAGNOSIS — M5136 Other intervertebral disc degeneration, lumbar region: Secondary | ICD-10-CM

## 2022-05-05 DIAGNOSIS — M3501 Sicca syndrome with keratoconjunctivitis: Secondary | ICD-10-CM

## 2022-05-05 DIAGNOSIS — G8929 Other chronic pain: Secondary | ICD-10-CM

## 2022-05-05 DIAGNOSIS — Z79899 Other long term (current) drug therapy: Secondary | ICD-10-CM

## 2022-05-05 DIAGNOSIS — M0579 Rheumatoid arthritis with rheumatoid factor of multiple sites without organ or systems involvement: Secondary | ICD-10-CM

## 2022-05-05 DIAGNOSIS — I7300 Raynaud's syndrome without gangrene: Secondary | ICD-10-CM

## 2022-07-04 IMAGING — CT CT HEAD W/O CM
3 of 4 series · 13 of 47 positions shown, 15 images · non-contrast
Comparison: None Available.

CLINICAL DATA: 80-year-old female altered mental status.



[Series 2: head without · axial · non-contrast · 0.39mm/px · z∈[-74,+31]mm · 7 of 29 slices shown, 9 images]
[im 4/29  brain]
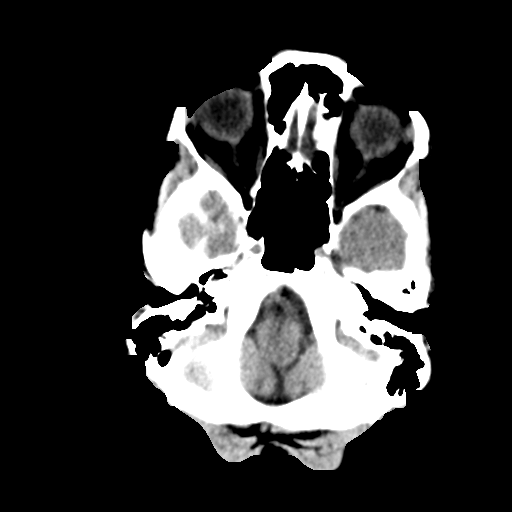
[im 4/29  bone]
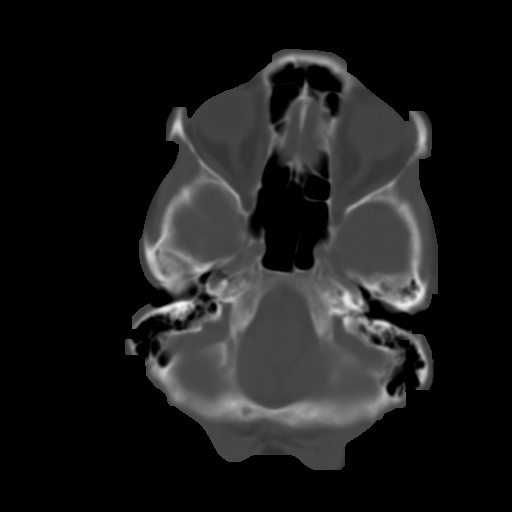
[im 8/29  brain]
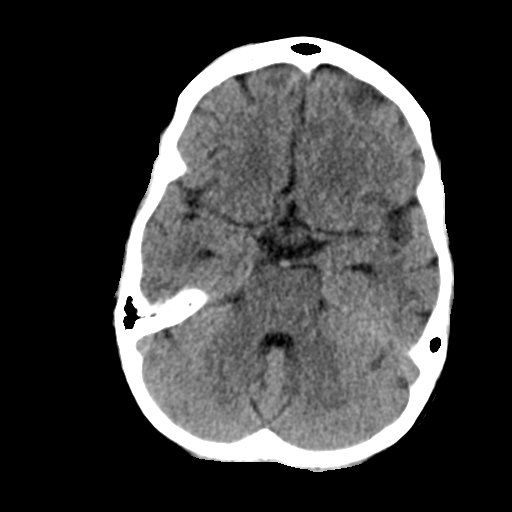
[im 11/29  brain]
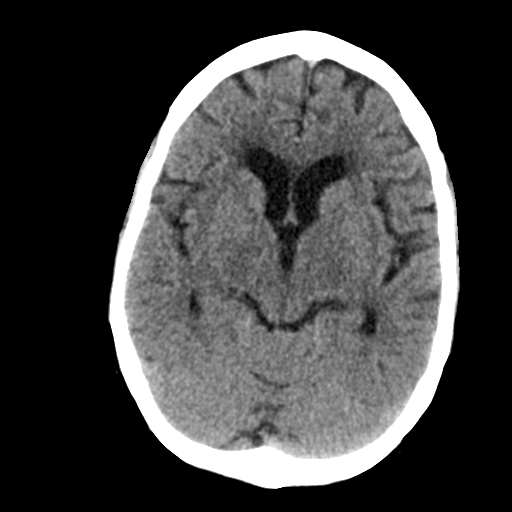
[im 15/29  brain]
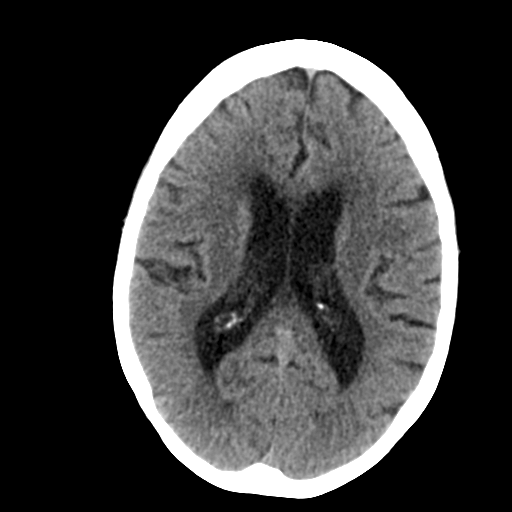
[im 18/29  brain]
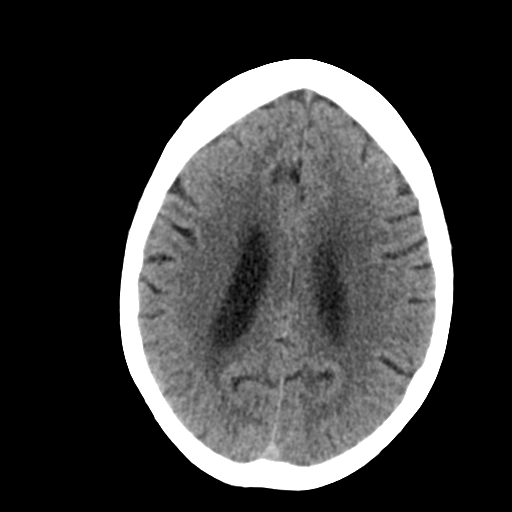
[im 18/29  bone]
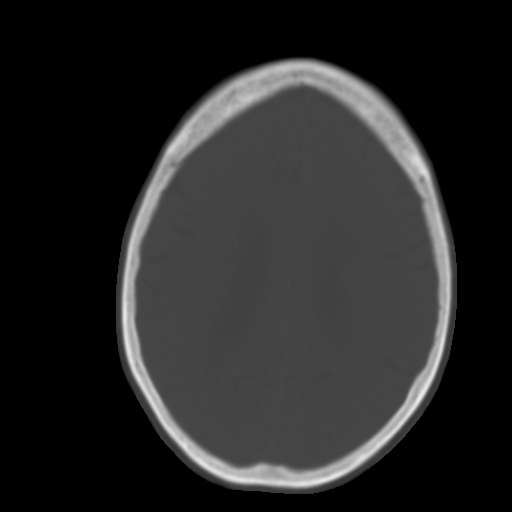
[im 22/29  brain]
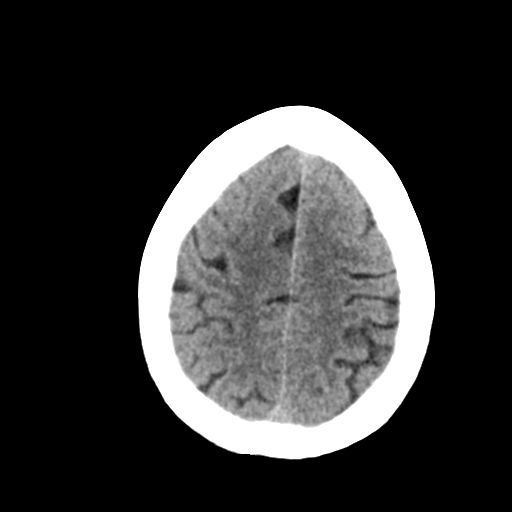
[im 25/29  brain]
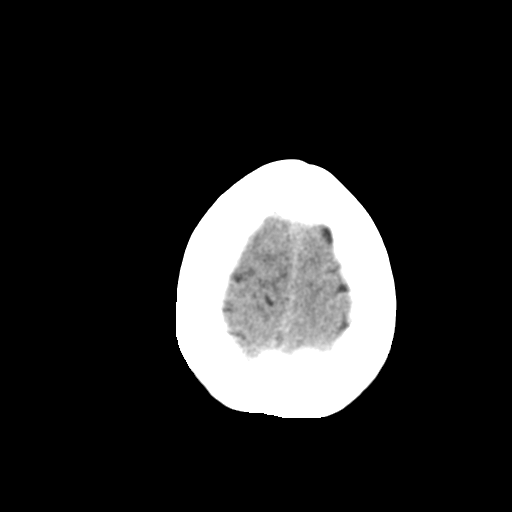

[Series 4: head without cor · coronal · non-contrast · 0.27mm/px · 3 of 67 slices shown]
[im 23/67  brain]
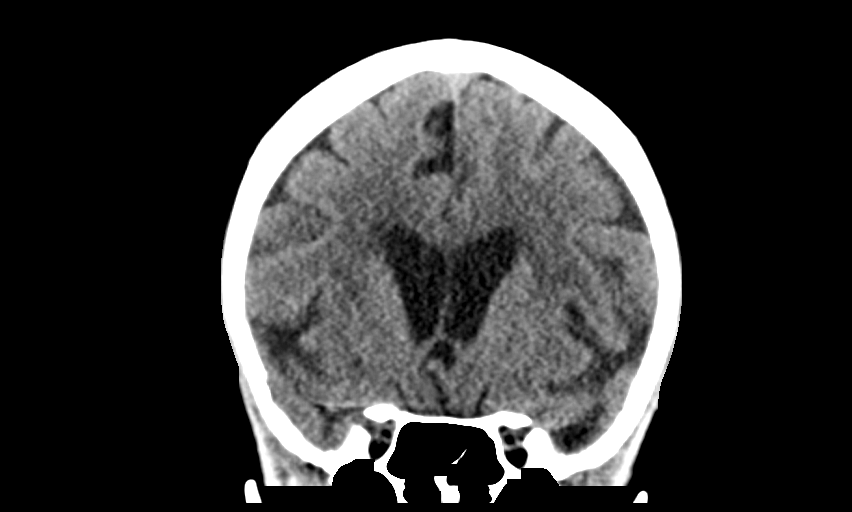
[im 30/67  brain]
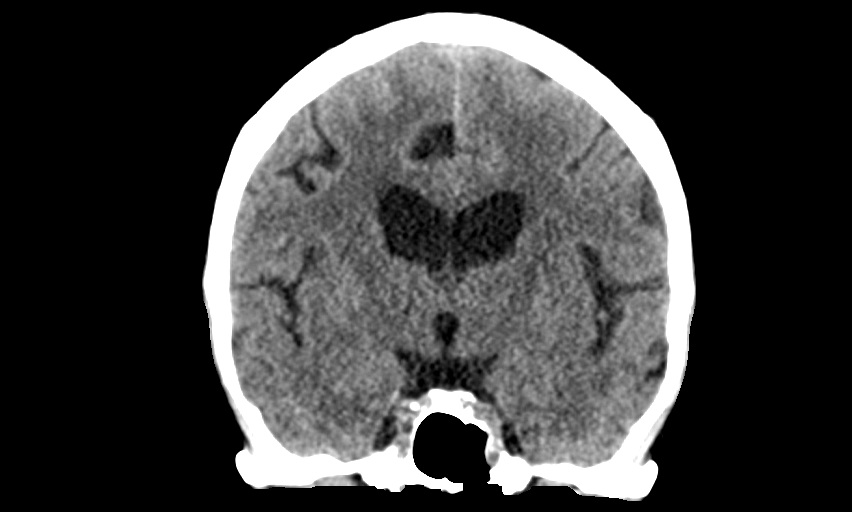
[im 37/67  brain]
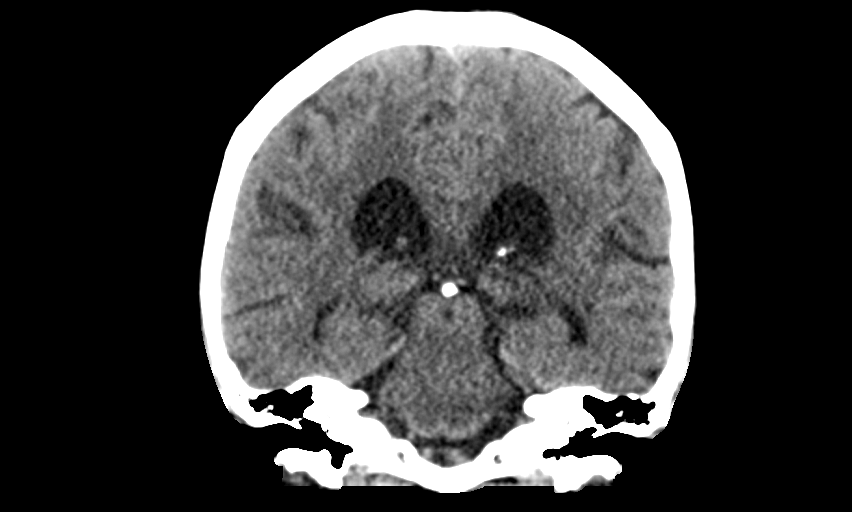

[Series 5: head without sag · sagittal · non-contrast · 0.27mm/px · 3 of 65 slices shown]
[im 22/65  brain]
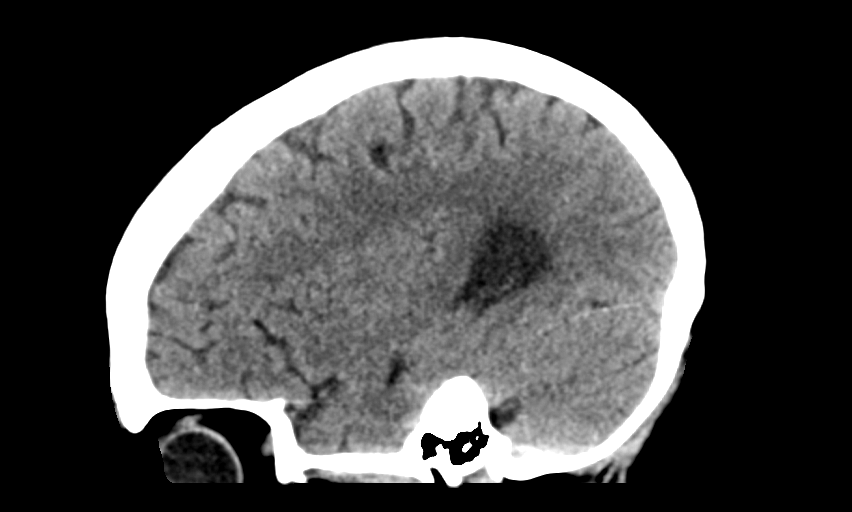
[im 33/65  brain]
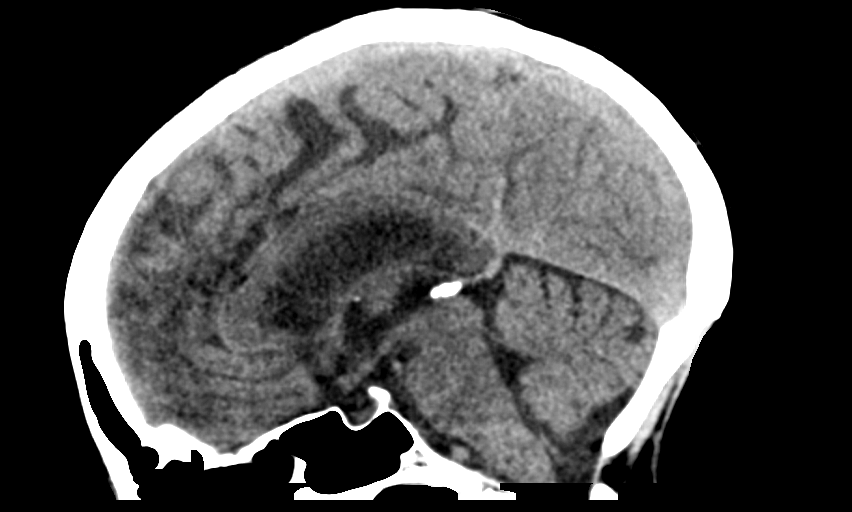
[im 43/65  brain]
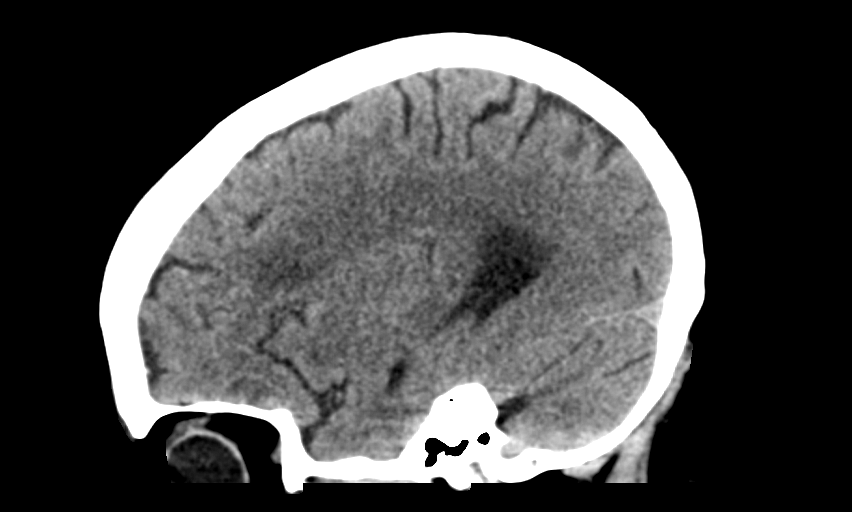

[13 of 47 positions shown; findings below may reference images not displayed]

FINDINGS: Brain: Mild ventricular prominence is probably ex vacuo, overall
cerebral volume appears within normal limits for age. No evidence of
transependymal edema.

No midline shift, mass effect, evidence of mass lesion, intracranial
hemorrhage or evidence of cortically based acute infarction. Patchy
bilateral mostly anterior cerebral white matter hypodensity, some
deep white matter capsule involvement. No cortical encephalomalacia
identified.

Vascular: Calcified atherosclerosis at the skull base. No suspicious
intracranial vascular hyperdensity.

Skull: Osteopenia.  No acute osseous abnormality identified.

Sinuses/Orbits: Tympanic cavities, Visualized paranasal sinuses and
mastoids are clear.

Other: No acute orbit or scalp soft tissue findings.
IMPRESSION: 1. No acute intracranial abnormality.
2. Mild to moderate for age cerebral white matter changes, most
commonly due to chronic small vessel disease.

## 2022-11-29 ENCOUNTER — Other Ambulatory Visit: Payer: Self-pay | Admitting: Physician Assistant

## 2022-11-29 DIAGNOSIS — M0579 Rheumatoid arthritis with rheumatoid factor of multiple sites without organ or systems involvement: Secondary | ICD-10-CM

## 2022-11-29 DIAGNOSIS — Z79899 Other long term (current) drug therapy: Secondary | ICD-10-CM

## 2022-12-09 HISTORY — PX: CHOLECYSTECTOMY: SHX55

## 2022-12-30 NOTE — Progress Notes (Signed)
Office Visit Note  Patient: Brenda Hanna             Date of Birth: 10-08-1940           MRN: [id number]             PCP: Hadley Pen, MD Referring: Hadley Pen, MD Visit Date: 01/12/2023 Occupation: @GUAROCC @  Subjective:  Medication management  History of Present Illness: Brenda Hanna is a 82 y.o. female with seropositive arthritis, Sjogren's and osteoarthritis.  She returns today after her last visit on Nov 30, 2021.  Patient had a rough last year.  She states in June she developed intestinal blockage requiring surgery and colostomy.  She had reversal of colostomy in November 2023.  She underwent cholecystectomy in June 2024.  She states she continued hydroxychloroquine and half a tablet for about 6 months since the last visit.  She states gradually the pain is coming back.  She has been experiencing pain and discomfort in her shoulders, hands and elbows.  She also has some discomfort in her knees and ankles.  She has intermittent discomfort in her lower back depending on the activities.  She has intermittent discomfort from fibromyalgia.  She continues to have dry mouth and dry eye symptoms.  She continues to use Restasis eyedrops.  She denies any shortness of breath or dysphagia.    Activities of Daily Living:  Patient reports morning stiffness for 5 minutes.   Patient Reports nocturnal pain.  Difficulty dressing/grooming: Denies Difficulty climbing stairs: Denies Difficulty getting out of chair: Reports Difficulty using hands for taps, buttons, cutlery, and/or writing: Reports  Review of Systems  Constitutional:  Negative for fatigue.  HENT:  Positive for mouth dryness. Negative for mouth sores.   Eyes:  Positive for dryness.  Respiratory:  Negative for shortness of breath.   Cardiovascular:  Negative for chest pain and palpitations.  Gastrointestinal:  Positive for diarrhea. Negative for blood in stool and constipation.  Endocrine: Negative for increased  urination.  Genitourinary:  Negative for involuntary urination.  Musculoskeletal:  Positive for joint pain, joint pain, joint swelling, muscle weakness and morning stiffness. Negative for gait problem, myalgias, muscle tenderness and myalgias.  Skin:  Positive for hair loss. Negative for color change, rash and sensitivity to sunlight.  Allergic/Immunologic: Negative for susceptible to infections.  Neurological:  Negative for dizziness and headaches.  Hematological:  Negative for swollen glands.  Psychiatric/Behavioral:  Positive for sleep disturbance. Negative for depressed mood. The patient is nervous/anxious.     PMFS History:  Patient Active Problem List   Diagnosis Date Noted   Rheumatoid arthritis involving multiple sites with positive rheumatoid factor (HCC) 07/14/2016   High risk medication use 07/14/2016   Sjogren's syndrome with keratoconjunctivitis sicca (HCC) 07/14/2016   Raynaud's disease without gangrene 07/14/2016   Achilles tendinitis of both lower extremities 07/14/2016   Fibromyalgia 07/14/2016   Anxiety and depression 07/14/2016   History of asthma 07/14/2016   Environmental allergies 07/14/2016   Gastroesophageal reflux disease without esophagitis 07/14/2016   Neuropathy 07/14/2016   Osteopenia of multiple sites 07/14/2016   Pain in both hands 07/14/2016   Pain in both feet 07/14/2016    Past Medical History:  Diagnosis Date   Back pain    Fibromyalgia    Osteoarthritis    Rheumatoid arteritis (HCC)    Ruptured lumbar disc    Sjogren's disease (HCC)     Family History  Problem Relation Age of Onset   Heart  failure Mother    Neuropathy Father    Heart failure Father    Healthy Daughter    Healthy Daughter    Healthy Son    Past Surgical History:  Procedure Laterality Date   ABDOMINAL HYSTERECTOMY     BLADDER SURGERY     CHOLECYSTECTOMY  12/09/2022   COLOSTOMY REVERSAL  05/04/2022   dental implants  2022   intestinal blockage surgery  12/28/2021    colostomy bag   Social History   Social History Narrative   Right Handed    Two story home   Immunization History  Administered Date(s) Administered   Influenza, High Dose Seasonal PF 04/06/2017   PFIZER(Purple Top)SARS-COV-2 Vaccination 07/19/2019, 08/09/2019, 06/04/2020, 02/10/2021   Pfizer Covid-19 Vaccine Bivalent Booster 63yrs & up 06/02/2021   Zoster Recombinant(Shingrix) 01/31/2018     Objective: Vital Signs: BP (!) 157/74 (BP Location: Left Arm, Patient Position: Sitting, Cuff Size: Normal)   Pulse 62   Resp 14   Ht 5' (1.524 m)   Wt 106 lb 6.4 oz (48.3 kg)   BMI 20.78 kg/m    Physical Exam Vitals and nursing note reviewed.  Constitutional:      Appearance: She is well-developed.  HENT:     Head: Normocephalic and atraumatic.  Eyes:     Conjunctiva/sclera: Conjunctivae normal.  Cardiovascular:     Rate and Rhythm: Normal rate and regular rhythm.     Heart sounds: Normal heart sounds.  Pulmonary:     Effort: Pulmonary effort is normal.     Breath sounds: Normal breath sounds.  Abdominal:     General: Bowel sounds are normal.     Palpations: Abdomen is soft.  Musculoskeletal:     Cervical back: Normal range of motion.  Lymphadenopathy:     Cervical: No cervical adenopathy.  Skin:    General: Skin is warm and dry.     Capillary Refill: Capillary refill takes less than 2 seconds.  Neurological:     Mental Status: She is alert and oriented to person, place, and time.  Psychiatric:        Behavior: Behavior normal.      Musculoskeletal Exam: Cervical spine is in good range of motion.  Shoulder joints, elbow joints, wrist joints, MCPs PIPs and DIPs been good range of motion with no synovitis.  Bilateral PIP and DIP thickening was noted.  Hip joints and knee joints in good range of motion without any warmth swelling or effusion.  There was no tenderness over ankles or MTPs.  CDAI Exam: CDAI Score: -- Patient Global: 40 / 100; Provider Global: 20 /  100 Swollen: --; Tender: -- Joint Exam 01/12/2023   No joint exam has been documented for this visit   There is currently no information documented on the homunculus. Go to the Rheumatology activity and complete the homunculus joint exam.  Investigation: No additional findings.  Imaging: No results found.  Recent Labs: Lab Results  Component Value Date   WBC 11.3 (H) 12/18/2021   HGB 12.1 12/18/2021   PLT 380 12/18/2021   NA 132 (L) 12/18/2021   K 4.3 12/18/2021   CL 98 12/18/2021   CO2 25 12/18/2021   GLUCOSE 111 (H) 12/18/2021   BUN 14 12/18/2021   CREATININE 0.95 12/18/2021   BILITOT 1.3 (H) 12/18/2021   ALKPHOS 97 12/18/2021   AST 26 12/18/2021   ALT 13 12/18/2021   PROT 6.4 (L) 12/18/2021   ALBUMIN 3.5 12/18/2021   CALCIUM 9.0 12/18/2021  GFRAA 82 12/23/2020    Speciality Comments:  PLQ eye exam normal on 03/15/2021 WNL  [facility] f/u  1 year  Procedures:  No procedures performed Allergies: Methotrexate, Contrast media [iodinated contrast media], Penicillins, and Shellfish allergy   Assessment / Plan:     Visit Diagnoses: Rheumatoid arthritis involving multiple sites with positive rheumatoid factor (HCC) - Positive RF, negative CCP: Patient returns today after her last visit in May 2023.  She states she was on Plaquenil for about 6 months after her last visit and then stopped it.  She had intestinal blockage and June 2023 and underwent surgery.  She had colostomy reversal in November 2023.  She also underwent cholecystectomy in June 2024.  She states she is gradually recovering from all the surgeries.  She has been experiencing increased joint pain and stiffness.  She states she does much better while she is on low-dose Plaquenil.  She would like to resume Plaquenil.  She was taking Plaquenil 200 mg half tablet a day.  Side effects were reviewed.  Patient was advised to get labs.  Once we have lab results available then we will send a prescription for  Plaquenil.  High risk medication use - Plaquenil 200 mg 1/2 tablet by mouth daily. PLQ eye exam normal on 03/15/2021.  Patient was advised to schedule eye examination to screen for ocular toxicity.  Labs from December 18, 2021 were reviewed.  CBC and CMP were stable.  Will get labs today and then every 5 months.  Information about immunization was placed in the AVS.  Sjogren's syndrome with keratoconjunctivitis sicca (HCC) - +ANA, +Ro,+RF , sicca: -She continues to have mild sicca symptoms.  She is on Restasis.  Primary osteoarthritis of both hands-she had lateral CMC PIP and DIP thickening.  DDD (degenerative disc disease), lumbar -she has intermittent discomfort in her lower back.  She is followed by Dr. Otelia Sergeant.  Chronic SI joint pain  Raynaud's disease without gangrene-currently not diabetic.  Fibromyalgia -she has some generalized pain and discomfort from fibromyalgia.  Ambien 10 mg at bedtime for insomnia.  Osteopenia of multiple sites - Followed by her PCP.  Neuropathy - Diagnosed with peripheral neuropathy by Dr. Allena Katz on 02/22/2021.  She tried Lyrica and gabapentin but discontinued due to side effects.    Orders: Orders Placed This Encounter  Procedures   CBC with Differential/Platelet   CMP14+EGFR   No orders of the defined types were placed in this encounter.    Follow-Up Instructions: Return in about 5 months (around 06/14/2023) for Rheumatoid arthritis.   Pollyann Savoy, MD  Note - This record has been created using Animal nutritionist.  Chart creation errors have been sought, but may not always  have been located. Such creation errors do not reflect on  the standard of medical care.

## 2023-01-12 ENCOUNTER — Encounter: Payer: Self-pay | Admitting: Rheumatology

## 2023-01-12 ENCOUNTER — Ambulatory Visit: Payer: PPO | Attending: Rheumatology | Admitting: Rheumatology

## 2023-01-12 ENCOUNTER — Telehealth: Payer: Self-pay | Admitting: Rheumatology

## 2023-01-12 ENCOUNTER — Other Ambulatory Visit: Payer: Self-pay

## 2023-01-12 VITALS — BP 157/74 | HR 62 | Resp 14 | Ht 60.0 in | Wt 106.4 lb

## 2023-01-12 DIAGNOSIS — M19041 Primary osteoarthritis, right hand: Secondary | ICD-10-CM

## 2023-01-12 DIAGNOSIS — M3501 Sicca syndrome with keratoconjunctivitis: Secondary | ICD-10-CM | POA: Diagnosis not present

## 2023-01-12 DIAGNOSIS — M0579 Rheumatoid arthritis with rheumatoid factor of multiple sites without organ or systems involvement: Secondary | ICD-10-CM

## 2023-01-12 DIAGNOSIS — Z79899 Other long term (current) drug therapy: Secondary | ICD-10-CM | POA: Diagnosis not present

## 2023-01-12 DIAGNOSIS — G8929 Other chronic pain: Secondary | ICD-10-CM

## 2023-01-12 DIAGNOSIS — M533 Sacrococcygeal disorders, not elsewhere classified: Secondary | ICD-10-CM

## 2023-01-12 DIAGNOSIS — M797 Fibromyalgia: Secondary | ICD-10-CM

## 2023-01-12 DIAGNOSIS — M5136 Other intervertebral disc degeneration, lumbar region: Secondary | ICD-10-CM

## 2023-01-12 DIAGNOSIS — I7300 Raynaud's syndrome without gangrene: Secondary | ICD-10-CM

## 2023-01-12 DIAGNOSIS — M19042 Primary osteoarthritis, left hand: Secondary | ICD-10-CM

## 2023-01-12 DIAGNOSIS — G629 Polyneuropathy, unspecified: Secondary | ICD-10-CM

## 2023-01-12 DIAGNOSIS — R296 Repeated falls: Secondary | ICD-10-CM

## 2023-01-12 DIAGNOSIS — M8589 Other specified disorders of bone density and structure, multiple sites: Secondary | ICD-10-CM

## 2023-01-12 NOTE — Telephone Encounter (Signed)
Pending lab results, patient will be re-starting plaquenil 200mg  1/2 tab po every day, per Dr. Corliss Skains. Thanks!   Provided patient with PLQ eye exam form today.

## 2023-01-12 NOTE — Telephone Encounter (Signed)
Pt called and will go to [facility] in [place]. Will call us a day before when she is able to go

## 2023-01-12 NOTE — Addendum Note (Signed)
Addended by: Henriette Combs on: 01/12/2023 12:28 PM   Modules accepted: Orders

## 2023-01-12 NOTE — Telephone Encounter (Signed)
Attempted to contact the patient and left message for patient to call the office. Patient had an appointment this morning. Dr. Corliss Skains did not realize her labs were not up to date. We were unable to catch the patient before she left. Would like to see if patient can go to the [facility] in [place].

## 2023-01-12 NOTE — Patient Instructions (Signed)
Standing Labs We placed an order today for your standing lab work.   Please have your standing labs drawn in November  Please have your labs drawn 2 weeks prior to your appointment so that the provider can discuss your lab results at your appointment, if possible.  Please note that you may see your imaging and lab results in MyChart before we have reviewed them. We will contact you once all results are reviewed. Please allow our office up to 72 hours to thoroughly review all of the results before contacting the office for clarification of your results.  WALK-IN LAB HOURS  Monday through Thursday from 8:00 am -12:30 pm and 1:00 pm-5:00 pm and Friday from 8:00 am-12:00 pm.  Patients with office visits requiring labs will be seen before walk-in labs.  You may encounter longer than normal wait times. Please allow additional time. Wait times may be shorter on  Monday and Thursday afternoons.  We do not book appointments for walk-in labs. We appreciate your patience and understanding with our staff.   Labs are drawn by [facility]. Please bring your co-pay at the time of your lab draw.  You may receive a bill from [facility] for your lab work.  Please note if you are on Hydroxychloroquine and and an order has been placed for a Hydroxychloroquine level,  you will need to have it drawn 4 hours or more after your last dose.  If you wish to have your labs drawn at another location, please call the office 24 hours in advance so we can fax the orders.  The office is located at [address], [place], [place] [address]   If you have any questions regarding directions or hours of operation,  please call [phone].   As a reminder, please drink plenty of water prior to coming for your lab work. Thanks!   Vaccines You are taking a medication(s) that can suppress your immune system.  The following immunizations are recommended: Flu annually Covid-19  RSV Td/Tdap (tetanus, diphtheria,  pertussis) every 10 years Pneumonia (Prevnar 15 then Pneumovax 23 at least 1 year apart.  Alternatively, can take Prevnar 20 without needing additional dose) Shingrix: 2 doses from 4 weeks to 6 months apart  Please check with your PCP to make sure you are up to date.

## 2023-01-12 NOTE — Telephone Encounter (Signed)
Noted. Will release labs once patient calls.

## 2023-01-19 NOTE — Telephone Encounter (Signed)
Reached out to patient to touch base and see if she has been able to make an appointment to have her labs done. Patient states she has been under the weather for the last few days. Patient states she will try for tomorrow and if not then the first of next week. Patient will call prior to going for lab orders to be released.

## 2023-01-24 ENCOUNTER — Telehealth: Payer: Self-pay | Admitting: Rheumatology

## 2023-01-24 ENCOUNTER — Other Ambulatory Visit: Payer: Self-pay | Admitting: *Deleted

## 2023-01-24 DIAGNOSIS — Z79899 Other long term (current) drug therapy: Secondary | ICD-10-CM

## 2023-01-24 NOTE — Telephone Encounter (Signed)
Lab Orders released.  

## 2023-01-24 NOTE — Telephone Encounter (Signed)
Patient called requesting her labwork orders be sent to [facility] in [place].  Patient plans to go this morning, 01/24/23.

## 2023-01-25 LAB — CMP14+EGFR
ALT: 7 IU/L (ref 0–32)
AST: 18 IU/L (ref 0–40)
Albumin: 3.8 g/dL (ref 3.8–4.8)
Alkaline Phosphatase: 117 IU/L (ref 44–121)
BUN/Creatinine Ratio: 16 (ref 12–28)
BUN: 15 mg/dL (ref 8–27)
Bilirubin Total: <0.2 mg/dL (ref 0.0–1.2)
CO2: 24 mmol/L (ref 20–29)
Calcium: 9.3 mg/dL (ref 8.7–10.3)
Chloride: 96 mmol/L (ref 96–106)
Creatinine, Ser: 0.94 mg/dL (ref 0.57–1.00)
Globulin, Total: 2.8 g/dL (ref 1.5–4.5)
Glucose: 103 mg/dL — ABNORMAL HIGH (ref 70–99)
Potassium: 5.3 mmol/L — ABNORMAL HIGH (ref 3.5–5.2)
Sodium: 134 mmol/L (ref 134–144)
Total Protein: 6.6 g/dL (ref 6.0–8.5)
eGFR: 62 mL/min/{1.73_m2} (ref >59)

## 2023-01-25 LAB — CBC WITH DIFFERENTIAL/PLATELET
Basophils Absolute: 0 10*3/uL (ref 0.0–0.2)
Basos: 0 %
EOS (ABSOLUTE): 0.2 10*3/uL (ref 0.0–0.4)
Eos: 2 %
Hematocrit: 28.2 % — ABNORMAL LOW (ref 34.0–46.6)
Hemoglobin: 9 g/dL — ABNORMAL LOW (ref 11.1–15.9)
Immature Grans (Abs): 0 10*3/uL (ref 0.0–0.1)
Immature Granulocytes: 0 %
Lymphocytes Absolute: 1.7 10*3/uL (ref 0.7–3.1)
Lymphs: 22 %
MCH: 27.2 pg (ref 26.6–33.0)
MCHC: 31.9 g/dL (ref 31.5–35.7)
MCV: 85 fL (ref 79–97)
Monocytes Absolute: 0.5 10*3/uL (ref 0.1–0.9)
Monocytes: 7 %
Neutrophils Absolute: 5.6 10*3/uL (ref 1.4–7.0)
Neutrophils: 69 %
Platelets: 569 10*3/uL — ABNORMAL HIGH (ref 150–450)
RBC: 3.31 x10E6/uL — ABNORMAL LOW (ref 3.77–5.28)
RDW: 13.2 % (ref 11.7–15.4)
WBC: 8 10*3/uL (ref 3.4–10.8)

## 2023-01-25 MED ORDER — HYDROXYCHLOROQUINE SULFATE 200 MG PO TABS
100.0000 mg | ORAL_TABLET | Freq: Every day | ORAL | 0 refills | Status: AC
Start: 2023-01-25 — End: ?

## 2023-01-25 NOTE — Progress Notes (Signed)
Potassium is elevated at 5.3.  Hemoglobin is low at 9.0.  Platelets are elevated due to anemia.  Is notify patient and her PCPs office.  Patient should be evaluated by her PCP for the cause of anemia.

## 2023-01-25 NOTE — Telephone Encounter (Signed)
Patient will call to schedule her eye exam and notify us when it is scheduled for.

## 2023-04-05 ENCOUNTER — Other Ambulatory Visit: Payer: Self-pay | Admitting: Rheumatology

## 2023-04-05 DIAGNOSIS — M0579 Rheumatoid arthritis with rheumatoid factor of multiple sites without organ or systems involvement: Secondary | ICD-10-CM

## 2023-04-05 DIAGNOSIS — Z79899 Other long term (current) drug therapy: Secondary | ICD-10-CM

## 2023-04-06 NOTE — Telephone Encounter (Signed)
Last Fill: 01/25/2023  Eye exam: 03/15/2021 WNL     Labs: 01/24/2023 Potassium is elevated at 5.3.  Hemoglobin is low at 9.0.  Platelets are elevated due to anemia.   Next Visit: 06/15/2023  Last Visit: 01/12/2023  DX: Rheumatoid arthritis involving multiple sites with positive rheumatoid factor   Current Dose per office note 01/12/2023: Plaquenil 200 mg 1/2 tablet by mouth daily.   Spoke with patient and she states she has her eye exam is scheduled for February 2025. Patient encouraged to call and see if she can get a sooner appointment.   Okay to refill Plaquenil?

## 2023-06-02 NOTE — Progress Notes (Unsigned)
Office Visit Note  Patient: Brenda Hanna             Date of Birth: May 04, 1941           MRN: [id number]             PCP: Hadley Pen, MD Referring: Hadley Pen, MD Visit Date: 06/15/2023 Occupation: @GUAROCC @  Subjective:    History of Present Illness: Brenda Hanna is a 82 y.o. female with history of seropositive rheumatoid arthritis and osteoarthritis. Patient remains on  Plaquenil 200 mg 1/2 tablet by mouth daily   CBC and Cmp updated on 01/24/23.   Plaquenil eye exam scheduled in February 2025.   Activities of Daily Living:  Patient reports morning stiffness for *** {minute/hour:19697}.   Patient {ACTIONS;DENIES/REPORTS:21021675::"Denies"} nocturnal pain.  Difficulty dressing/grooming: {ACTIONS;DENIES/REPORTS:21021675::"Denies"} Difficulty climbing stairs: {ACTIONS;DENIES/REPORTS:21021675::"Denies"} Difficulty getting out of chair: {ACTIONS;DENIES/REPORTS:21021675::"Denies"} Difficulty using hands for taps, buttons, cutlery, and/or writing: {ACTIONS;DENIES/REPORTS:21021675::"Denies"}  No Rheumatology ROS completed.   PMFS History:  Patient Active Problem List   Diagnosis Date Noted   Rheumatoid arthritis involving multiple sites with positive rheumatoid factor (HCC) 07/14/2016   High risk medication use 07/14/2016   Sjogren's syndrome with keratoconjunctivitis sicca (HCC) 07/14/2016   Raynaud's disease without gangrene 07/14/2016   Achilles tendinitis of both lower extremities 07/14/2016   Fibromyalgia 07/14/2016   Anxiety and depression 07/14/2016   History of asthma 07/14/2016   Environmental allergies 07/14/2016   Gastroesophageal reflux disease without esophagitis 07/14/2016   Neuropathy 07/14/2016   Osteopenia of multiple sites 07/14/2016   Pain in both hands 07/14/2016   Pain in both feet 07/14/2016    Past Medical History:  Diagnosis Date   Back pain    Fibromyalgia    Osteoarthritis    Rheumatoid arteritis (HCC)    Ruptured lumbar  disc    Sjogren's disease (HCC)     Family History  Problem Relation Age of Onset   Heart failure Mother    Neuropathy Father    Heart failure Father    Healthy Daughter    Healthy Daughter    Healthy Son    Past Surgical History:  Procedure Laterality Date   ABDOMINAL HYSTERECTOMY     BLADDER SURGERY     CHOLECYSTECTOMY  12/09/2022   COLOSTOMY REVERSAL  05/04/2022   dental implants  2022   intestinal blockage surgery  12/28/2021   colostomy bag   Social History   Social History Narrative   Right Handed    Two story home   Immunization History  Administered Date(s) Administered   Influenza, High Dose Seasonal PF 04/06/2017   PFIZER(Purple Top)SARS-COV-2 Vaccination 07/19/2019, 08/09/2019, 06/04/2020, 02/10/2021   Pfizer Covid-19 Vaccine Bivalent Booster 48yrs & up 06/02/2021   Zoster Recombinant(Shingrix) 01/31/2018     Objective: Vital Signs: There were no vitals taken for this visit.   Physical Exam Vitals and nursing note reviewed.  Constitutional:      Appearance: She is well-developed.  HENT:     Head: Normocephalic and atraumatic.  Eyes:     Conjunctiva/sclera: Conjunctivae normal.  Cardiovascular:     Rate and Rhythm: Normal rate and regular rhythm.     Heart sounds: Normal heart sounds.  Pulmonary:     Effort: Pulmonary effort is normal.     Breath sounds: Normal breath sounds.  Abdominal:     General: Bowel sounds are normal.     Palpations: Abdomen is soft.  Musculoskeletal:     Cervical back: Normal range of  motion.  Lymphadenopathy:     Cervical: No cervical adenopathy.  Skin:    General: Skin is warm and dry.     Capillary Refill: Capillary refill takes less than 2 seconds.  Neurological:     Mental Status: She is alert and oriented to person, place, and time.  Psychiatric:        Behavior: Behavior normal.      Musculoskeletal Exam: ***  CDAI Exam: CDAI Score: -- Patient Global: --; Provider Global: -- Swollen: --; Tender:  -- Joint Exam 06/15/2023   No joint exam has been documented for this visit   There is currently no information documented on the homunculus. Go to the Rheumatology activity and complete the homunculus joint exam.  Investigation: No additional findings.  Imaging: No results found.  Recent Labs: Lab Results  Component Value Date   WBC 8.0 01/24/2023   HGB 9.0 (L) 01/24/2023   PLT 569 (H) 01/24/2023   NA 134 01/24/2023   K 5.3 (H) 01/24/2023   CL 96 01/24/2023   CO2 24 01/24/2023   GLUCOSE 103 (H) 01/24/2023   BUN 15 01/24/2023   CREATININE 0.94 01/24/2023   BILITOT <0.2 01/24/2023   ALKPHOS 117 01/24/2023   AST 18 01/24/2023   ALT 7 01/24/2023   PROT 6.6 01/24/2023   ALBUMIN 3.8 01/24/2023   CALCIUM 9.3 01/24/2023   GFRAA 82 12/23/2020    Speciality Comments:  PLQ eye exam normal on 03/15/2021 WNL  [facility] f/u  1 year  PLQ Eye Exam scheduled for 04/26/2023  Procedures:  No procedures performed Allergies: Methotrexate, Contrast media [iodinated contrast media], Penicillins, and Shellfish allergy   Assessment / Plan:     Visit Diagnoses: Rheumatoid arthritis involving multiple sites with positive rheumatoid factor (HCC)  High risk medication use  Sjogren's syndrome with keratoconjunctivitis sicca (HCC)  Primary osteoarthritis of both hands  Degeneration of intervertebral disc of lumbar region without discogenic back pain or lower extremity pain  Chronic SI joint pain  Raynaud's disease without gangrene  Fibromyalgia  Osteopenia of multiple sites  Neuropathy  Frequent falls  Orders: No orders of the defined types were placed in this encounter.  No orders of the defined types were placed in this encounter.   Face-to-face time spent with patient was *** minutes. Greater than 50% of time was spent in counseling and coordination of care.  Follow-Up Instructions: No follow-ups on file.   Gearldine Bienenstock, PA-C  Note - This record has been  created using Dragon software.  Chart creation errors have been sought, but may not always  have been located. Such creation errors do not reflect on  the standard of medical care.

## 2023-06-03 ENCOUNTER — Other Ambulatory Visit: Payer: Self-pay | Admitting: Physician Assistant

## 2023-06-03 DIAGNOSIS — M0579 Rheumatoid arthritis with rheumatoid factor of multiple sites without organ or systems involvement: Secondary | ICD-10-CM

## 2023-06-03 DIAGNOSIS — Z79899 Other long term (current) drug therapy: Secondary | ICD-10-CM

## 2023-06-05 NOTE — Telephone Encounter (Signed)
Last Fill: 04/06/2023 (30 Day supply)   Eye exam: 03/15/2021 WNL      Labs: 01/24/2023 Potassium is elevated at 5.3.  Hemoglobin is low at 9.0.  Platelets are elevated due to anemia.    Next Visit: 06/15/2023   Last Visit: 01/12/2023   DX: Rheumatoid arthritis involving multiple sites with positive rheumatoid factor    Current Dose per office note 01/12/2023: Plaquenil 200 mg 1/2 tablet by mouth daily.    Spoke with patient and she states she has her eye exam is scheduled for February 2025. Patient encouraged to call and see if she can get a sooner appointment.    Okay to refill Plaquenil?

## 2023-06-12 ENCOUNTER — Other Ambulatory Visit: Payer: Self-pay | Admitting: Physician Assistant

## 2023-06-12 DIAGNOSIS — Z79899 Other long term (current) drug therapy: Secondary | ICD-10-CM

## 2023-06-12 DIAGNOSIS — M0579 Rheumatoid arthritis with rheumatoid factor of multiple sites without organ or systems involvement: Secondary | ICD-10-CM

## 2023-06-14 ENCOUNTER — Other Ambulatory Visit: Payer: Self-pay | Admitting: Physician Assistant

## 2023-06-14 DIAGNOSIS — M0579 Rheumatoid arthritis with rheumatoid factor of multiple sites without organ or systems involvement: Secondary | ICD-10-CM

## 2023-06-14 DIAGNOSIS — Z79899 Other long term (current) drug therapy: Secondary | ICD-10-CM

## 2023-06-15 ENCOUNTER — Ambulatory Visit: Payer: PPO | Admitting: Physician Assistant

## 2023-06-15 DIAGNOSIS — M19042 Primary osteoarthritis, left hand: Secondary | ICD-10-CM

## 2023-06-15 DIAGNOSIS — I7300 Raynaud's syndrome without gangrene: Secondary | ICD-10-CM

## 2023-06-15 DIAGNOSIS — G8929 Other chronic pain: Secondary | ICD-10-CM

## 2023-06-15 DIAGNOSIS — M51369 Other intervertebral disc degeneration, lumbar region without mention of lumbar back pain or lower extremity pain: Secondary | ICD-10-CM

## 2023-06-15 DIAGNOSIS — M3501 Sicca syndrome with keratoconjunctivitis: Secondary | ICD-10-CM

## 2023-06-15 DIAGNOSIS — M797 Fibromyalgia: Secondary | ICD-10-CM

## 2023-06-15 DIAGNOSIS — R296 Repeated falls: Secondary | ICD-10-CM

## 2023-06-15 DIAGNOSIS — G629 Polyneuropathy, unspecified: Secondary | ICD-10-CM

## 2023-06-15 DIAGNOSIS — M8589 Other specified disorders of bone density and structure, multiple sites: Secondary | ICD-10-CM

## 2023-06-15 DIAGNOSIS — Z79899 Other long term (current) drug therapy: Secondary | ICD-10-CM

## 2023-06-15 DIAGNOSIS — M0579 Rheumatoid arthritis with rheumatoid factor of multiple sites without organ or systems involvement: Secondary | ICD-10-CM

## 2023-06-22 NOTE — Progress Notes (Signed)
 Office Visit Note  Patient: Brenda Hanna             Date of Birth: 31-Aug-1940           MRN: [id number]             PCP: Simpers Lamar LABOR, MD Referring: Simpers Lamar LABOR, MD Visit Date: 07/06/2023 Occupation: @GUAROCC @  Subjective:  Neuropathy   History of Present Illness: Brenda Hanna is a 82 y.o. female with history of seropositive rheumatoid arthritis and osteoarthritis. Patient remains on  Plaquenil  200 mg 1/2 tablet by mouth daily.  She is tolerating Plaquenil  without any side effects and has not had any recent gaps in therapy.  Patient continues to have chronic pain involving multiple joints.  Her pain has been most severe in both feet due to underlying neuropathy.  Patient was previously evaluated by Dr. Tobie and was prescribed gabapentin .  She could not tolerate taking gabapentin  and discontinued.   Activities of Daily Living:  Patient reports morning stiffness for 30 minutes.   Patient Reports nocturnal pain.  Difficulty dressing/grooming: Denies Difficulty climbing stairs: Denies Difficulty getting out of chair: Reports Difficulty using hands for taps, buttons, cutlery, and/or writing: Denies  Review of Systems  Constitutional:  Positive for fatigue.  HENT:  Positive for mouth dryness. Negative for mouth sores.   Eyes:  Positive for dryness.  Respiratory:  Positive for shortness of breath.   Cardiovascular:  Negative for chest pain and palpitations.  Gastrointestinal:  Positive for constipation and diarrhea. Negative for blood in stool.  Endocrine: Negative for increased urination.  Genitourinary:  Positive for involuntary urination.  Musculoskeletal:  Positive for joint pain, gait problem, joint pain, joint swelling and morning stiffness. Negative for myalgias, muscle weakness, muscle tenderness and myalgias.  Skin:  Positive for color change and hair loss. Negative for rash and sensitivity to sunlight.  Allergic/Immunologic: Negative for susceptible to  infections.  Neurological:  Negative for dizziness and headaches.  Hematological:  Negative for swollen glands.  Psychiatric/Behavioral:  Positive for sleep disturbance. Negative for depressed mood. The patient is nervous/anxious.     PMFS History:  Patient Active Problem List   Diagnosis Date Noted   Rheumatoid arthritis involving multiple sites with positive rheumatoid factor (HCC) 07/14/2016   High risk medication use 07/14/2016   Sjogren's syndrome with keratoconjunctivitis sicca (HCC) 07/14/2016   Raynaud's disease without gangrene 07/14/2016   Achilles tendinitis of both lower extremities 07/14/2016   Fibromyalgia 07/14/2016   Anxiety and depression 07/14/2016   History of asthma 07/14/2016   Environmental allergies 07/14/2016   Gastroesophageal reflux disease without esophagitis 07/14/2016   Neuropathy 07/14/2016   Osteopenia of multiple sites 07/14/2016   Pain in both hands 07/14/2016   Pain in both feet 07/14/2016    Past Medical History:  Diagnosis Date   Back pain    Fibromyalgia    Osteoarthritis    Rheumatoid arteritis (HCC)    Ruptured lumbar disc    Sjogren's disease (HCC)     Family History  Problem Relation Age of Onset   Heart failure Mother    Neuropathy Father    Heart failure Father    Healthy Daughter    Healthy Daughter    Healthy Son    Past Surgical History:  Procedure Laterality Date   ABDOMINAL HYSTERECTOMY     BLADDER SURGERY     CHOLECYSTECTOMY  12/09/2022   COLOSTOMY REVERSAL  05/04/2022   dental implants  2022   intestinal  blockage surgery  12/28/2021   colostomy bag   Social History   Social History Narrative   Right Handed    Two story home   Immunization History  Administered Date(s) Administered   Influenza, High Dose Seasonal PF 04/06/2017   PFIZER(Purple Top)SARS-COV-2 Vaccination 07/19/2019, 08/09/2019, 06/04/2020, 02/10/2021   Pfizer Covid-19 Vaccine Bivalent Booster 47yrs & up 06/02/2021   Zoster  Recombinant(Shingrix) 01/31/2018     Objective: Vital Signs: BP (!) 186/75 (BP Location: Left Arm, Cuff Size: Small)   Pulse 69   Resp 12   Ht 5' 1 (1.549 m)   Wt 113 lb 12.8 oz (51.6 kg)   BMI 21.50 kg/m    Physical Exam Vitals and nursing note reviewed.  Constitutional:      Appearance: She is well-developed.  HENT:     Head: Normocephalic and atraumatic.  Eyes:     Conjunctiva/sclera: Conjunctivae normal.  Cardiovascular:     Rate and Rhythm: Normal rate and regular rhythm.     Heart sounds: Normal heart sounds.  Pulmonary:     Effort: Pulmonary effort is normal.     Breath sounds: Normal breath sounds.  Abdominal:     General: Bowel sounds are normal.     Palpations: Abdomen is soft.  Musculoskeletal:     Cervical back: Normal range of motion.  Lymphadenopathy:     Cervical: No cervical adenopathy.  Skin:    General: Skin is warm and dry.     Capillary Refill: Capillary refill takes less than 2 seconds.  Neurological:     Mental Status: She is alert and oriented to person, place, and time.  Psychiatric:        Behavior: Behavior normal.      Musculoskeletal Exam: C-spine has good range of motion.  Shoulder joints, elbow joints, wrist joints, MCPs, PIPs, DIPs have good range of motion with no synovitis.  PIP and DIP thickening consistent with osteoarthritis of both hands.  Hip joints have good range of motion with no groin pain.  Knee joints have good range of motion with some discomfort but no warmth or effusion noted.  Ankle joints have good range of motion with no tenderness or joint swelling.  CDAI Exam: CDAI Score: -- Patient Global: --; Provider Global: -- Swollen: --; Tender: -- Joint Exam 07/06/2023   No joint exam has been documented for this visit   There is currently no information documented on the homunculus. Go to the Rheumatology activity and complete the homunculus joint exam.  Investigation: No additional findings.  Imaging: No results  found.  Recent Labs: Lab Results  Component Value Date   WBC 8.0 01/24/2023   HGB 9.0 (L) 01/24/2023   PLT 569 (H) 01/24/2023   NA 134 01/24/2023   K 5.3 (H) 01/24/2023   CL 96 01/24/2023   CO2 24 01/24/2023   GLUCOSE 103 (H) 01/24/2023   BUN 15 01/24/2023   CREATININE 0.94 01/24/2023   BILITOT <0.2 01/24/2023   ALKPHOS 117 01/24/2023   AST 18 01/24/2023   ALT 7 01/24/2023   PROT 6.6 01/24/2023   ALBUMIN 3.8 01/24/2023   CALCIUM 9.3 01/24/2023   GFRAA 82 12/23/2020    Speciality Comments:  PLQ eye exam normal on 03/15/2021 WNL  [facility] f/u  1 year  PLQ Eye Exam scheduled for 08/2023  Procedures:  No procedures performed Allergies: Methotrexate, Contrast media [iodinated contrast media], Penicillins, and Shellfish allergy   Assessment / Plan:     Visit Diagnoses: Rheumatoid  arthritis involving multiple sites with positive rheumatoid factor (HCC) - Positive RF, negative CCP : No synovitis was noted on examination today.  She has been taking Plaquenil  200 mg half tablet by mouth every morning for management of rheumatoid arthritis.  She has been tolerating Plaquenil  without any side effects and has not had any recent gaps in therapy.  She has been experiencing increased pain and stiffness involving multiple joints especially both knees.  Most of her discomfort seems to be due to underlying osteoarthritis and neuropathy.  She has tried both Lyrica and gabapentin  in the past for management of neuropathy but discontinued due to possible side effects. Due to severity of her joint pain and stiffness her and her husband requested to discuss further treatment options.  She has no active inflammation on examination today.  She is apprehensive to try increasing the dose of Plaquenil  to 200 mg 1 tablet daily but is planning to initiate a trial.  Sed rate and CRP will also be checked for further evaluation.  She was advised to notify us  if she has any new or worsening symptoms.  - Plan:  Sedimentation rate, C-reactive protein  High risk medication use - Plaquenil  200 mg 1 tablet by mouth daily--patient plans on trying to increase the dose from half tablet to 1 tablet daily. CBC and CMP updated on 01/24/23. Orders for CBC and CMP Released today.  Plaquenil  eye exam scheduled in February 2025.   - Plan: CBC with Differential/Platelet, COMPLETE METABOLIC PANEL WITH GFR  Sjogren's syndrome with keratoconjunctivitis sicca (HCC) - +ANA, +Ro,+RF , sicca: Chronic, stable.  Patient uses Restasis for dry eyes.  She remains on Plaquenil  as prescribed.  Primary osteoarthritis of both hands: PIP and DIP thickening consistent with OA of both hands.  Degeneration of intervertebral disc of lumbar region without discogenic back pain or lower extremity pain: Chronic pain and stiffness.  Previously followed by Dr. Lucilla.  Chronic SI joint pain: Chronic pain  Raynaud's disease without gangrene: No signs of sclerodactyly.  No digital ulcerations noted.  Fibromyalgia: Patient continues to have generalized myalgias and muscle tenderness due to fibromyalgia.  Intolerance to gabapentin  and Lyrica in the past.  Remains active throughout the day.  Osteopenia of multiple sites - Followed by PCP  Neuropathy - Diagnosed with peripheral neuropathy by Dr. Tobie on 02/22/2021.  She tried Lyrica and gabapentin  but discontinued due to side effects.  Offered a second opinion to discuss other treatment options but she has declined at this time.  Orders: Orders Placed This Encounter  Procedures   CBC with Differential/Platelet   COMPLETE METABOLIC PANEL WITH GFR   Sedimentation rate   C-reactive protein   No orders of the defined types were placed in this encounter.    Follow-Up Instructions: Return in about 5 months (around 12/04/2023) for Rheumatoid arthritis.   Waddell CHRISTELLA Craze, PA-C  Note - This record has been created using Dragon software.  Chart creation errors have been sought, but may not  always  have been located. Such creation errors do not reflect on  the standard of medical care.

## 2023-07-06 ENCOUNTER — Ambulatory Visit: Payer: PPO | Attending: Physician Assistant | Admitting: Physician Assistant

## 2023-07-06 ENCOUNTER — Encounter: Payer: Self-pay | Admitting: Physician Assistant

## 2023-07-06 VITALS — BP 186/75 | HR 69 | Resp 12 | Ht 61.0 in | Wt 113.8 lb

## 2023-07-06 DIAGNOSIS — Z79899 Other long term (current) drug therapy: Secondary | ICD-10-CM | POA: Diagnosis not present

## 2023-07-06 DIAGNOSIS — M19041 Primary osteoarthritis, right hand: Secondary | ICD-10-CM

## 2023-07-06 DIAGNOSIS — M51369 Other intervertebral disc degeneration, lumbar region without mention of lumbar back pain or lower extremity pain: Secondary | ICD-10-CM

## 2023-07-06 DIAGNOSIS — M0579 Rheumatoid arthritis with rheumatoid factor of multiple sites without organ or systems involvement: Secondary | ICD-10-CM

## 2023-07-06 DIAGNOSIS — M797 Fibromyalgia: Secondary | ICD-10-CM

## 2023-07-06 DIAGNOSIS — I7300 Raynaud's syndrome without gangrene: Secondary | ICD-10-CM

## 2023-07-06 DIAGNOSIS — G629 Polyneuropathy, unspecified: Secondary | ICD-10-CM

## 2023-07-06 DIAGNOSIS — M3501 Sicca syndrome with keratoconjunctivitis: Secondary | ICD-10-CM | POA: Diagnosis not present

## 2023-07-06 DIAGNOSIS — G8929 Other chronic pain: Secondary | ICD-10-CM

## 2023-07-06 DIAGNOSIS — M19042 Primary osteoarthritis, left hand: Secondary | ICD-10-CM

## 2023-07-06 DIAGNOSIS — M8589 Other specified disorders of bone density and structure, multiple sites: Secondary | ICD-10-CM

## 2023-07-06 DIAGNOSIS — M533 Sacrococcygeal disorders, not elsewhere classified: Secondary | ICD-10-CM

## 2023-07-07 ENCOUNTER — Other Ambulatory Visit: Payer: Self-pay | Admitting: Physician Assistant

## 2023-07-07 DIAGNOSIS — Z79899 Other long term (current) drug therapy: Secondary | ICD-10-CM

## 2023-07-07 DIAGNOSIS — M0579 Rheumatoid arthritis with rheumatoid factor of multiple sites without organ or systems involvement: Secondary | ICD-10-CM

## 2023-07-07 LAB — COMPLETE METABOLIC PANEL WITH GFR
AG Ratio: 1.5 (calc) (ref 1.0–2.5)
ALT: 7 U/L (ref 6–29)
AST: 16 U/L (ref 10–35)
Albumin: 3.8 g/dL (ref 3.6–5.1)
Alkaline phosphatase (APISO): 106 U/L (ref 37–153)
BUN: 12 mg/dL (ref 7–25)
CO2: 27 mmol/L (ref 20–32)
Calcium: 8.8 mg/dL (ref 8.6–10.4)
Chloride: 101 mmol/L (ref 98–110)
Creat: 0.94 mg/dL (ref 0.60–0.95)
Globulin: 2.6 g/dL (ref 1.9–3.7)
Glucose, Bld: 147 mg/dL — ABNORMAL HIGH (ref 65–99)
Potassium: 4.7 mmol/L (ref 3.5–5.3)
Sodium: 136 mmol/L (ref 135–146)
Total Bilirubin: 0.3 mg/dL (ref 0.2–1.2)
Total Protein: 6.4 g/dL (ref 6.1–8.1)
eGFR: 61 mL/min/{1.73_m2} (ref >=60)

## 2023-07-07 LAB — CBC WITH DIFFERENTIAL/PLATELET
Absolute Lymphocytes: 1735 {cells}/uL (ref 850–3900)
Absolute Monocytes: 460 {cells}/uL (ref 200–950)
Basophils Absolute: 41 {cells}/uL (ref 0–200)
Basophils Relative: 0.7 %
Eosinophils Absolute: 254 {cells}/uL (ref 15–500)
Eosinophils Relative: 4.3 %
HCT: 26.4 % — ABNORMAL LOW (ref 35.0–45.0)
Hemoglobin: 7.9 g/dL — ABNORMAL LOW (ref 11.7–15.5)
MCH: 25.2 pg — ABNORMAL LOW (ref 27.0–33.0)
MCHC: 29.9 g/dL — ABNORMAL LOW (ref 32.0–36.0)
MCV: 84.1 fL (ref 80.0–100.0)
MPV: 10 fL (ref 7.5–12.5)
Monocytes Relative: 7.8 %
Neutro Abs: 3410 {cells}/uL (ref 1500–7800)
Neutrophils Relative %: 57.8 %
Platelets: 480 10*3/uL — ABNORMAL HIGH (ref 140–400)
RBC: 3.14 10*6/uL — ABNORMAL LOW (ref 3.80–5.10)
RDW: 14.5 % (ref 11.0–15.0)
Total Lymphocyte: 29.4 %
WBC: 5.9 10*3/uL (ref 3.8–10.8)

## 2023-07-07 LAB — C-REACTIVE PROTEIN: CRP: 12.6 mg/L — ABNORMAL HIGH (ref <8.0)

## 2023-07-07 LAB — SEDIMENTATION RATE: Sed Rate: 46 mm/h — ABNORMAL HIGH (ref 0–30)

## 2023-07-07 NOTE — Progress Notes (Signed)
 RBC count, hemoglobin, and hematocrit are significantly low--hgb is 7.9. MCH and MCHC are low likely due to iron deficiency. please notify the patient and advise her to see urgent evaluation.   Platelet count is slightly elevated-could be secondary to anemia.   ESR and CRP are also elevated.  Glucose is 147. Rest of CMP WNL

## 2023-07-07 NOTE — Telephone Encounter (Signed)
 Last Fill: 06/05/2023  Eye exam: 03/15/2021 WNL  [facility] f/u  1 year  PLQ Eye Exam scheduled for 08/2023   Labs: 07/06/2023 RBC count, hemoglobin, and hematocrit are significantly low--hgb is 7.9. MCH and MCHC are low likely due to iron deficiency.  Platelet count is slightly elevated-could be secondary to anemia.   ESR and CRP are also elevated. Glucose is 147. Rest of CMP WNL  Next Visit: 12/06/2023  Last Visit: 07/06/2023  DX: Rheumatoid arthritis involving multiple sites with positive rheumatoid factor   Current Dose per office note 07/06/2023: Plaquenil  200 mg 1 tablet by mouth daily   Okay to refill Plaquenil ?

## 2023-07-12 ENCOUNTER — Other Ambulatory Visit: Payer: Self-pay | Admitting: Physician Assistant

## 2023-07-12 DIAGNOSIS — M0579 Rheumatoid arthritis with rheumatoid factor of multiple sites without organ or systems involvement: Secondary | ICD-10-CM

## 2023-07-12 DIAGNOSIS — Z79899 Other long term (current) drug therapy: Secondary | ICD-10-CM

## 2023-07-19 ENCOUNTER — Other Ambulatory Visit: Payer: Self-pay | Admitting: Physician Assistant

## 2023-07-19 DIAGNOSIS — Z79899 Other long term (current) drug therapy: Secondary | ICD-10-CM

## 2023-07-19 DIAGNOSIS — M0579 Rheumatoid arthritis with rheumatoid factor of multiple sites without organ or systems involvement: Secondary | ICD-10-CM

## 2023-07-24 ENCOUNTER — Other Ambulatory Visit: Payer: Self-pay | Admitting: Physician Assistant

## 2023-07-24 DIAGNOSIS — Z79899 Other long term (current) drug therapy: Secondary | ICD-10-CM

## 2023-07-24 DIAGNOSIS — M0579 Rheumatoid arthritis with rheumatoid factor of multiple sites without organ or systems involvement: Secondary | ICD-10-CM

## 2023-10-11 ENCOUNTER — Other Ambulatory Visit: Payer: Self-pay | Admitting: Physician Assistant

## 2023-10-11 DIAGNOSIS — M0579 Rheumatoid arthritis with rheumatoid factor of multiple sites without organ or systems involvement: Secondary | ICD-10-CM

## 2023-10-11 DIAGNOSIS — Z79899 Other long term (current) drug therapy: Secondary | ICD-10-CM

## 2023-10-16 ENCOUNTER — Ambulatory Visit: Admitting: Internal Medicine

## 2023-11-22 NOTE — Progress Notes (Signed)
 Office Visit Note  Patient: Brenda Hanna             Date of Birth: 03-Sep-1940           MRN: [id number]             PCP: Melva Stabile, MD Referring: Melva Stabile, MD Visit Date: 12/06/2023 Occupation: @GUAROCC @  Subjective:  Pain in multiple joints  History of Present Illness: Brenda Hanna is a 83 y.o. female with seropositive rheumatoid arthritis and osteoarthritis.  She returns today after her last visit in January 2025.  She denies any joint pain or joint swelling.  She states she continues to have chronic diarrhea since she had cholecystectomy.  She would like to schedule an appointment with a gastroenterologist.  She has been also experiencing muscle cramps in her lower extremities.  She states she takes calcium and magnesium  supplement.  She states she had been taking Plaquenil  200 mg, half tablet p.o. twice daily.  She states her last eye examination was last year.  I do not have the report of her eye examination.    Activities of Daily Living:  Patient reports morning stiffness for 30 minutes.   Patient Reports nocturnal pain.  Difficulty dressing/grooming: Denies Difficulty climbing stairs: Reports Difficulty getting out of chair: Reports Difficulty using hands for taps, buttons, cutlery, and/or writing: Reports  Review of Systems  Constitutional:  Positive for fatigue.  HENT:  Positive for mouth dryness. Negative for mouth sores.   Eyes:  Positive for dryness.  Respiratory:  Negative for shortness of breath.   Cardiovascular:  Negative for chest pain and palpitations.  Gastrointestinal:  Positive for diarrhea. Negative for blood in stool and constipation.  Endocrine: Negative for increased urination.  Genitourinary:  Negative for involuntary urination.  Musculoskeletal:  Positive for joint pain, gait problem, joint pain, joint swelling, myalgias, muscle weakness, morning stiffness, muscle tenderness and myalgias.  Skin:  Positive for color change, rash  and hair loss. Negative for sensitivity to sunlight.  Allergic/Immunologic: Negative for susceptible to infections.  Neurological:  Negative for dizziness and headaches.  Hematological:  Negative for swollen glands.  Psychiatric/Behavioral:  Positive for sleep disturbance. Negative for depressed mood. The patient is nervous/anxious.     PMFS History:  Patient Active Problem List   Diagnosis Date Noted   Rheumatoid arthritis involving multiple sites with positive rheumatoid factor (HCC) 07/14/2016   High risk medication use 07/14/2016   Sjogren's syndrome with keratoconjunctivitis sicca (HCC) 07/14/2016   Raynaud's disease without gangrene 07/14/2016   Achilles tendinitis of both lower extremities 07/14/2016   Fibromyalgia 07/14/2016   Anxiety and depression 07/14/2016   History of asthma 07/14/2016   Environmental allergies 07/14/2016   Gastroesophageal reflux disease without esophagitis 07/14/2016   Neuropathy 07/14/2016   Osteopenia of multiple sites 07/14/2016   Pain in both hands 07/14/2016   Pain in both feet 07/14/2016    Past Medical History:  Diagnosis Date   Anemia    Anxiety    Back pain    Fibromyalgia    GERD (gastroesophageal reflux disease)    Hypothyroidism    Osteoarthritis    Osteopenia    Rheumatoid arteritis (HCC)    Ruptured lumbar disc    Seasonal allergies    Sjogren's disease (HCC)     Family History  Problem Relation Age of Onset   Heart failure Mother    Neuropathy Father    Heart failure Father    Healthy Daughter  Healthy Daughter    Healthy Son    Past Surgical History:  Procedure Laterality Date   ABDOMINAL HYSTERECTOMY     BLADDER SURGERY     CHOLECYSTECTOMY  12/09/2022   COLOSTOMY REVERSAL  05/04/2022   dental implants  2022   intestinal blockage surgery  12/28/2021   colostomy bag   Social History   Social History Narrative   Right Handed    Two story home   Immunization History  Administered Date(s) Administered    Influenza, High Dose Seasonal PF 04/06/2017   PFIZER(Purple Top)SARS-COV-2 Vaccination 07/19/2019, 08/09/2019, 06/04/2020, 02/10/2021   Pfizer Covid-19 Vaccine Bivalent Booster 66yrs & up 06/02/2021   Pfizer(Comirnaty)Fall Seasonal Vaccine 12 years and older 06/05/2023   Zoster Recombinant(Shingrix) 01/31/2018     Objective: Vital Signs: BP (!) 186/88 (BP Location: Left Arm, Patient Position: Sitting, Cuff Size: Normal)   Pulse 61   Resp 14   Ht 5' (1.524 m)   Wt 107 lb 3.2 oz (48.6 kg)   BMI 20.94 kg/m    Physical Exam Vitals and nursing note reviewed.  Constitutional:      Appearance: She is well-developed.  HENT:     Head: Normocephalic and atraumatic.  Eyes:     Conjunctiva/sclera: Conjunctivae normal.  Cardiovascular:     Rate and Rhythm: Normal rate and regular rhythm.     Heart sounds: Normal heart sounds.  Pulmonary:     Effort: Pulmonary effort is normal.     Breath sounds: Normal breath sounds.  Abdominal:     General: Bowel sounds are normal.     Palpations: Abdomen is soft.  Musculoskeletal:     Cervical back: Normal range of motion.  Lymphadenopathy:     Cervical: No cervical adenopathy.  Skin:    General: Skin is warm and dry.     Capillary Refill: Capillary refill takes less than 2 seconds.  Neurological:     Mental Status: She is alert and oriented to person, place, and time.  Psychiatric:        Behavior: Behavior normal.      Musculoskeletal Exam: Cervical, thoracic and lumbar spine were in good range of motion.  Shoulders, elbows, wrist joints, MCPs PIPs and DIPs with good range of motion with no synovitis.  Knee joints and ankles were in good range of motion.  There was no tenderness over MTPs.  CDAI Exam: CDAI Score: -- Patient Global: --; Provider Global: -- Swollen: --; Tender: -- Joint Exam 12/06/2023   No joint exam has been documented for this visit   There is currently no information documented on the homunculus. Go to the  Rheumatology activity and complete the homunculus joint exam.  Investigation: No additional findings.  Imaging: No results found.  Recent Labs: Lab Results  Component Value Date   WBC 5.9 07/06/2023   HGB 7.9 (L) 07/06/2023   PLT 480 (H) 07/06/2023   NA 136 07/06/2023   K 4.7 07/06/2023   CL 101 07/06/2023   CO2 27 07/06/2023   GLUCOSE 147 (H) 07/06/2023   BUN 12 07/06/2023   CREATININE 0.94 07/06/2023   BILITOT 0.3 07/06/2023   ALKPHOS 117 01/24/2023   AST 16 07/06/2023   ALT 7 07/06/2023   PROT 6.4 07/06/2023   ALBUMIN 3.8 01/24/2023   CALCIUM 8.8 07/06/2023   GFRAA 82 12/23/2020    Speciality Comments: PLQ eye exam normal on 03/15/2021 WNL  [facility] f/u  1 year  Called [facility] in [place] (on 12/06/2023) and  requested PLQ eye exam report from 04/2023  Procedures:  No procedures performed Allergies: Methotrexate, Contrast media [iodinated contrast media], Penicillins, and Shellfish allergy   Assessment / Plan:     Visit Diagnoses: Rheumatoid arthritis involving multiple sites with positive rheumatoid factor (HCC) - Positive RF, negative CCP : Patient denies having rheumatoid arthritis flare.  She has stiffness in her joints but no joint swelling.  No synovitis was noted on the examination.  She has been taking Plaquenil  200 mg, half tablet p.o. twice daily without any interruption.  She states she has been getting eye examination on a regular basis.  We do not have eye examination report since 2022.  High risk medication use - Plaquenil  200 mg 1 tablet, half tablet p.o. twice daily.  PLQ eye exam normal on 03/15/2021.  Patient states she has been getting eye examination on yearly basis.  An examination form was given.  July 06, 2023 hemoglobin was low at 7.9.- Plan: CBC with Differential/Platelet, Comprehensive metabolic panel with GFR today.  Sjogren's syndrome with keratoconjunctivitis sicca (HCC) - +ANA, +Ro,+RF , sicca: Patient continues to have dry mouth and  dry eye symptoms.  Lungs were clear to auscultation.  There was no lymphadenopathy.  Increased risk of ILD and lymphoma with Sjogren's was discussed.  She has been using over-the-counter products.  Primary osteoarthritis of both hands-bilateral PIP and DIP thickening with no synovitis was noted.  Degeneration of intervertebral disc of lumbar region without discogenic back pain or lower extremity pain-she has intermittent discomfort in her lower back.  Chronic SI joint pain-chronic discomfort.  Raynaud's disease without gangrene-currently not active.  She had good capillary refill without any nailbed capillary changes.  Fibromyalgia-she 20 history of generalized pain and discomfort from fibromyalgia.  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  She would like to be referred to gastroenterology.  Will place the referral.  Anemia, unspecified type -I will refer her to hematology for the evaluation of chronic anemia.  Plan: Iron, TIBC and Ferritin Panel, Serum protein electrophoresis with reflex  Muscle cramps -she complains of muscle cramps which could be due to anemia.  Plan: Magnesium   Neuropathy - Diagnosed with peripheral neuropathy by Dr. Lydia Sams on 02/22/2021.  She tried Lyrica and gabapentin  but discontinued due to side effects.  Orders: Orders Placed This Encounter  Procedures   CBC with Differential/Platelet   Comprehensive metabolic panel with GFR   Iron, TIBC and Ferritin Panel   Magnesium    Serum protein electrophoresis with reflex   Ambulatory referral to Gastroenterology   Ambulatory referral to Hematology / Oncology   No orders of the defined types were placed in this encounter.    Follow-Up Instructions: Return in about 5 months (around 05/07/2024) for Rheumatoid arthritis, Sjogren's.   Nicholas Bari, MD  Note - This record has been created using Animal nutritionist.  Chart  creation errors have been sought, but may not always  have been located. Such creation errors do not reflect on  the standard of medical care.

## 2023-12-01 ENCOUNTER — Other Ambulatory Visit: Payer: Self-pay

## 2023-12-06 ENCOUNTER — Encounter: Payer: Self-pay | Admitting: Rheumatology

## 2023-12-06 ENCOUNTER — Ambulatory Visit: Payer: PPO | Attending: Rheumatology | Admitting: Rheumatology

## 2023-12-06 VITALS — BP 186/88 | HR 61 | Resp 14 | Ht 60.0 in | Wt 107.2 lb

## 2023-12-06 DIAGNOSIS — G8929 Other chronic pain: Secondary | ICD-10-CM

## 2023-12-06 DIAGNOSIS — K529 Noninfective gastroenteritis and colitis, unspecified: Secondary | ICD-10-CM

## 2023-12-06 DIAGNOSIS — G629 Polyneuropathy, unspecified: Secondary | ICD-10-CM

## 2023-12-06 DIAGNOSIS — D649 Anemia, unspecified: Secondary | ICD-10-CM

## 2023-12-06 DIAGNOSIS — Z79899 Other long term (current) drug therapy: Secondary | ICD-10-CM

## 2023-12-06 DIAGNOSIS — M8589 Other specified disorders of bone density and structure, multiple sites: Secondary | ICD-10-CM

## 2023-12-06 DIAGNOSIS — M3501 Sicca syndrome with keratoconjunctivitis: Secondary | ICD-10-CM | POA: Diagnosis not present

## 2023-12-06 DIAGNOSIS — M19041 Primary osteoarthritis, right hand: Secondary | ICD-10-CM

## 2023-12-06 DIAGNOSIS — M51369 Other intervertebral disc degeneration, lumbar region without mention of lumbar back pain or lower extremity pain: Secondary | ICD-10-CM

## 2023-12-06 DIAGNOSIS — M797 Fibromyalgia: Secondary | ICD-10-CM

## 2023-12-06 DIAGNOSIS — R252 Cramp and spasm: Secondary | ICD-10-CM

## 2023-12-06 DIAGNOSIS — M19042 Primary osteoarthritis, left hand: Secondary | ICD-10-CM

## 2023-12-06 DIAGNOSIS — I7300 Raynaud's syndrome without gangrene: Secondary | ICD-10-CM

## 2023-12-06 DIAGNOSIS — M0579 Rheumatoid arthritis with rheumatoid factor of multiple sites without organ or systems involvement: Secondary | ICD-10-CM

## 2023-12-06 DIAGNOSIS — M533 Sacrococcygeal disorders, not elsewhere classified: Secondary | ICD-10-CM

## 2023-12-06 NOTE — Patient Instructions (Signed)
 Standing Labs We placed an order today for your standing lab work.   Please have your standing labs drawn in CBC with differential and CMP with GFR today  every 5 months.  Please have your labs drawn 2 weeks prior to your appointment so that the provider can discuss your lab results at your appointment, if possible.  Please note that you may see your imaging and lab results in MyChart before we have reviewed them. We will contact you once all results are reviewed. Please allow our office up to 72 hours to thoroughly review all of the results before contacting the office for clarification of your results.  WALK-IN LAB HOURS  Monday through Thursday from 8:00 am -12:30 pm and 1:00 pm-4:00 pm and Friday from 8:00 am-12:00 pm.  Patients with office visits requiring labs will be seen before walk-in labs.  You may encounter longer than normal wait times. Please allow additional time. Wait times may be shorter on  Monday and Thursday afternoons.  We do not book appointments for walk-in labs. We appreciate your patience and understanding with our staff.   Labs are drawn by [facility]. Please bring your co-pay at the time of your lab draw.  You may receive a bill from [facility] for your lab work.  Please note if you are on Hydroxychloroquine  and and an order has been placed for a Hydroxychloroquine  level,  you will need to have it drawn 4 hours or more after your last dose.  If you wish to have your labs drawn at another location, please call the office 24 hours in advance so we can fax the orders.  The office is located at [address], [place], [place] [address]   If you have any questions regarding directions or hours of operation,  please call [phone].   As a reminder, please drink plenty of water prior to coming for your lab work. Thanks!   Vaccines You are taking a medication(s) that can suppress your immune system.  The following immunizations are recommended: Flu  annually Covid-19  Td/Tdap (tetanus, diphtheria, pertussis) every 10 years Pneumonia (Prevnar 15 then Pneumovax 23 at least 1 year apart.  Alternatively, can take Prevnar 20 without needing additional dose) Shingrix: 2 doses from 4 weeks to 6 months apart  Please check with your PCP to make sure you are up to date.

## 2023-12-07 ENCOUNTER — Ambulatory Visit: Payer: Self-pay | Admitting: Rheumatology

## 2023-12-07 NOTE — Progress Notes (Signed)
 Hemoglobin is low and stable.  Iron studies show low iron.  Magnesium  is normal.  Creatinine is mildly elevated.  Sodium is slightly low and stable.  SPEP pending.  Please forward results to her PCP.  We also referred her to hematology.

## 2023-12-08 ENCOUNTER — Telehealth: Payer: Self-pay | Admitting: Rheumatology

## 2023-12-08 ENCOUNTER — Other Ambulatory Visit: Payer: Self-pay | Admitting: Hematology and Oncology

## 2023-12-08 DIAGNOSIS — D509 Iron deficiency anemia, unspecified: Secondary | ICD-10-CM

## 2023-12-08 NOTE — Telephone Encounter (Signed)
 Patient advised the referral was placed for her chronic anemia.

## 2023-12-08 NOTE — Progress Notes (Cosign Needed)
 [facility] [address] [place],  [place]  [address] [phone]  Clinic Day:  12/11/2023   Referring physician: Nicholas Bari, MD  Patient Care Team: Patient Care Team: Melva Stabile, MD as PCP - General (Family Medicine)   REASON FOR CONSULTATION:  Iron deficiency anemia  HISTORY OF PRESENT ILLNESS:   Brenda Hanna is a 83 y.o. female with iron deficiency anemia who is referred in consultation by Dr. Nicholas Bari for assessment and management. The patient was found to have a hemoglobin of 7.9 with an MCV of 84 at Dr. Jory Ng office in January.  WBCs were 5.9, 58% neutrophils, 29% lymphocytes, 8% monocytes, 4% eosinophils and 1% basophils.  He was unremarkable except for glucose 147, this was nonfasting.  Platelets were 480,000.  Sed rate and C-reactive protein were elevated.  She had seen Dr. Christia Cowboy in January after that and was found to have iron deficiency serum iron of 20, TIBC 436, iron saturation 5% and ferritin 6, which is consistent with iron deficiency.  She was started on oral iron and transfused 1 unit of packed red blood cells as an outpatient.  She reported diarrhea at that time.  At her visit in March, she had persistent diarrhea and right upper quadrant pain.  Abdominal x-rays revealed nonobstructive gas pattern.  GI stool panel revealed enterotoxin E. coli and E. coli shiga-like toxin production.  C. difficile was not done due to the stool being formed.  Stool culture sent to [place] [facility] lab was negative for Shiga toxin by EIA.  No Shiga toxin producing E. coli were isolated.  She was referred to [facility] due to persistent diarrhea and right upper quadrant pain in April but did not show for her appointment.    Review of her records revealed mild chronic anemia dating back to 2019 with her hemoglobin running between 11 and 12.  Her hemoglobin dropped to 9 in July 2024 with an MCV of 85.   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 She has a sigmoid colectomy with temporary colostomy in June 2023.  Pathology was benign.  She had reversal of her colostomy in November 2023.  She has had an increase in diarrhea since those surgeries.  She saw Dr. Alvira Josephs on June 4 for routine follow-up of her rheumatoid arthritis.  She had persistent anemia with a hemoglobin of 10.2 and MCV of 92.  WBCs 6.1, 53 % neutrophils, 35 % lymphocytes, 10 % monocytes, 2% eosinophils and 1% basophils.  Platelets 468,000.  She had evidence of persistent iron deficiency with serum iron 32, TIBC 397, iron saturation 8% and ferritin 11.  CMP was unremarkable except for creatinine 1.01, estimated GFR 56 and sodium 134.  Magnesium  was normal.  The patient reports fatigue but is still able to do her daily activities.  She reports decreased appetite and 6 pound weight loss since January.  She reports dark stools but denies bright red blood in the stool.  She reports intermittent right upper quadrant pain, diarrhea.  She states the diarrhea was worse while on ferrous sulfate, so this was discontinued after a few months.  She states Dr. Christia Cowboy sent her in a prescription iron tablet, but she has not started that.  She states that many fruits of vegetables also cause diarrhea.  Past medical history: Rheumatoid arthritis, hypothyroidism, GERD, anxiety, insomnia, seasonal allergies, peripheral polyneuropathy, Sjogren's syndrome, Raynaud's disease, fibromyalgia, osteopenia.  History of asthma.  Status post bladder surgery, eye surgery, hysterectomy/one ovary removed.   Social history: She has never smoked or drank alcohol.  She denies other substance use. Born in [place] at [facility].  She was raised in [place] and lives in [place] with her husband. They have 3 adult children, 6 grandchildren and 6 great-grandchildren.  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  No family history of malignancy.  REVIEW OF SYSTEMS:   Review of Systems  Constitutional:  Positive for appetite change, fatigue and unexpected weight change (6 lbs loss since January). Negative for chills, diaphoresis and fever.  HENT:   Negative for lump/mass, mouth sores, nosebleeds, sore throat and trouble swallowing.   Respiratory:  Negative for cough, hemoptysis and shortness of breath.   Cardiovascular:  Negative for chest pain and leg swelling.  Gastrointestinal:  Positive for abdominal pain (intermittent sharp right upper quadrant pain), blood in stool (stool dark) and diarrhea. Negative for constipation, nausea and vomiting.  Genitourinary:  Negative for difficulty urinating, dysuria, frequency, hematuria and vaginal bleeding.   Musculoskeletal:  Positive for arthralgias (right forearm after fall) and gait problem (falls). Negative for back pain and myalgias.  Skin:  Positive for itching and rash (scalp, forehead-resolving).  Neurological:  Positive for gait problem (falls) and numbness (neuropathy). Negative for dizziness, extremity weakness, headaches and light-headedness.  Hematological:  Negative for adenopathy. Does not bruise/bleed easily.  Psychiatric/Behavioral:  Negative for depression and sleep disturbance. The patient is not nervous/anxious.      VITALS:   Blood pressure (!) 189/58, pulse 66, temperature 97.7 F (36.5 C), temperature source Oral, resp. rate 18, height 5\' 1"  (1.549 m), weight 105 lb 12.8 oz (48 kg), SpO2 97%.  Wt Readings from Last 3 Encounters:  12/11/23 105 lb 12.8 oz (48 kg)  12/06/23 107 lb 3.2 oz (48.6 kg)  07/06/23 113 lb 12.8 oz (51.6 kg)    Body mass index is 19.99 kg/m.  Performance status (ECOG): 1 - Symptomatic but completely ambulatory  PHYSICAL EXAM:   Physical Exam Vitals and nursing note reviewed.  Constitutional:      General: She is not in acute distress.     Appearance: Normal appearance. She is underweight. She is ill-appearing (chronically ill appearing). She is not toxic-appearing or diaphoretic.  HENT:     Head: Normocephalic and atraumatic.     Mouth/Throat:     Mouth: Mucous membranes are moist.     Pharynx: Oropharynx is clear. No oropharyngeal exudate or posterior oropharyngeal erythema.  Eyes:     General: No scleral icterus.    Extraocular Movements: Extraocular movements intact.     Conjunctiva/sclera: Conjunctivae normal.     Pupils: Pupils are equal, round, and reactive to light.  Cardiovascular:     Rate and Rhythm: Normal rate and regular rhythm.     Heart sounds: Normal heart sounds. No murmur heard.    No friction rub. No gallop.  Pulmonary:     Effort: Pulmonary effort is normal.     Breath sounds: Normal breath sounds. No wheezing, rhonchi or rales.  Abdominal:     General: Bowel sounds are normal. There is no distension.     Palpations: Abdomen is soft. There is no hepatomegaly, splenomegaly or mass.     Tenderness: There is abdominal tenderness (mild, right mid abdomen).     Comments: 1 cm  nodule of the umbilicus  Musculoskeletal:        General: Normal range of motion.     Cervical back: Normal range of motion and neck supple. No tenderness.     Right lower leg: No edema.     Left lower leg: No edema.  Lymphadenopathy:     Cervical: No cervical adenopathy.     Upper Body:     Right upper body: No supraclavicular or axillary adenopathy.     Left upper body: No supraclavicular or axillary adenopathy.     Lower Body: No right inguinal adenopathy. No left inguinal adenopathy.  Skin:    General: Skin is warm and dry.     Coloration: Skin is not jaundiced or pale.     Findings: No rash.  Neurological:     Mental Status: She is alert and oriented to person, place, and time.     Cranial Nerves: No cranial nerve deficit.  Psychiatric:        Mood and Affect: Mood normal.        Behavior: Behavior normal.         Thought Content: Thought content normal.      LABS:      Latest Ref Rng & Units 12/11/2023    2:24 PM 12/06/2023    3:45 PM 07/06/2023    1:49 PM  CBC  WBC 4.0 - 10.5 K/uL 6.7  6.1  5.9   Hemoglobin 12.0 - 15.0 g/dL 9.5  19.1  7.9   Hematocrit 36.0 - 46.0 % 28.9  33.0  26.4   Platelets 150 - 400 K/uL 408  468  480     Latest Reference Range & Units 12/11/23 14:24  Neutrophils % 62  Lymphocytes % 27  Monocytes Relative % 8  Eosinophil % 2  Basophil % 1  Immature Granulocytes % 0  NEUT# 1.7 - 7.7 K/uL 4.1  Lymphs Abs 0.7 - 4.0 K/uL 1.8  Monocyte # 0.1 - 1.0 K/uL 0.5  Eosinophils Absolute 0.0 - 0.5 K/uL 0.1  Basophils Absolute 0.0 - 0.1 K/uL 0.1       Latest Ref Rng & Units 12/06/2023    3:45 PM 07/06/2023    1:49 PM 01/24/2023   10:12 AM  CMP  Glucose 65 - 99 mg/dL 83  478  295   BUN 7 - 25 mg/dL 18  12  15    Creatinine 0.60 - 0.95 mg/dL 6.21  3.08  6.57   Sodium 135 - 146 mmol/L 134  136  134   Potassium 3.5 - 5.3 mmol/L 4.9  4.7  5.3   Chloride 98 - 110 mmol/L 99  101  96   CO2 20 - 32 mmol/L 27  27  24    Calcium 8.6 - 10.4 mg/dL 9.6  8.8  9.3   Total Protein 6.1 - 8.1 g/dL 6.1 - 8.1 g/dL 7.3    7.2  6.4  6.6   Total Bilirubin 0.2 - 1.2 mg/dL 0.4  0.3  <8.4   Alkaline Phos 44 - 121 IU/L   117   AST 10 - 35 U/L 17  16  18    ALT 6 - 29 U/L 6  7  7      Lab Results  Component Value Date   TOTALPROTELP 7.0 07/14/2016   ALBUMINELP 4.0 12/06/2023   A1GS 0.4 (H) 12/06/2023   A2GS 0.9 12/06/2023   BETS 0.5 12/06/2023   BETA2SER 0.3 12/06/2023   GAMS 1.1 12/06/2023  SPEI  12/06/2023     Comment:     . A poorly-defined band of restricted protein mobility is detected in the gamma globulins. It is unlikely that this may represent a monoclonal protein; however, immunofixation analysis is available if clinically indicated. .    Lab Results  Component Value Date   TIBC 397 12/06/2023   FERRITIN 11 (L) 12/06/2023   IRONPCTSAT 8 (L) 12/06/2023   Lab Results   Component Value Date   LDH 155 12/11/2023    STUDIES:   No results found.    HISTORY:   Past Medical History:  Diagnosis Date   Anemia    Anxiety    Back pain    Fibromyalgia    GERD (gastroesophageal reflux disease)    Hypothyroidism    Osteoarthritis    Osteopenia    Rheumatoid arteritis (HCC)    Ruptured lumbar disc    Seasonal allergies    Sjogren's disease (HCC)     Past Surgical History:  Procedure Laterality Date   ABDOMINAL HYSTERECTOMY     BLADDER SURGERY     CHOLECYSTECTOMY  12/09/2022   COLOSTOMY REVERSAL  05/04/2022   dental implants  2022   intestinal blockage surgery  12/28/2021   colostomy bag    Family History  Problem Relation Age of Onset   Heart failure Mother    Neuropathy Father    Heart failure Father    Healthy Daughter    Healthy Daughter    Healthy Son     Social History:  reports that she has never smoked. She has never been exposed to tobacco smoke. She has never used smokeless tobacco. She reports that she does not drink alcohol and does not use drugs.The patient is accompanied by her husband and daughter, Ginger, today.  Allergies:  Allergies  Allergen Reactions   Methotrexate Hives and Shortness Of Breath   Contrast Media [Iodinated Contrast Media]    Penicillins Hives   Shellfish Allergy     Current Medications: Current Outpatient Medications  Medication Sig Dispense Refill   finasteride (PROSCAR) 5 MG tablet Take 5 mg by mouth daily. 1/2 tab     tiZANidine (ZANAFLEX) 2 MG tablet Take 2 mg by mouth at bedtime.     Acetaminophen  (TYLENOL  PO) Take by mouth as needed.     albuterol (PROVENTIL HFA;VENTOLIN HFA) 108 (90 Base) MCG/ACT inhaler Inhale into the lungs as needed. (Patient not taking: Reported on 12/06/2023)     albuterol (PROVENTIL) (2.5 MG/3ML) 0.083% nebulizer solution  (Patient not taking: Reported on 12/06/2023)     amLODipine  (NORVASC ) 5 MG tablet Take 1 tablet (5 mg total) by mouth daily. (Patient taking  differently: Take 2.5 mg by mouth daily.) 30 tablet 0   Biotin 10 MG CAPS Take 10 mg by mouth daily.     CALCIUM-MAGNESIUM -ZINC PO Take 1 tablet by mouth daily.     cetirizine (ZYRTEC) 10 MG tablet Take 10 mg by mouth daily.     Cyanocobalamin (VITAMIN B-12 PO)  (Patient not taking: Reported on 12/11/2023)     Ferrous Fumarate (HEMOCYTE - 106 MG FE) 324 (106 Fe) MG TABS tablet Take 106 mg of iron by mouth daily. (Patient not taking: Reported on 12/11/2023)     hydroxychloroquine  (PLAQUENIL ) 200 MG tablet Take 1 tablet (200 mg total) by mouth daily. (Patient taking differently: Take 200 mg by mouth daily. 1/2 tab twice daily) 30 tablet 0   ketoconazole (NIZORAL) 2 % shampoo SMARTSIG:1 Topical Once a Week  levothyroxine (SYNTHROID, LEVOTHROID) 75 MCG tablet Take 75 mcg by mouth daily before breakfast.      loperamide (IMODIUM) 2 MG capsule Take 2 mg by mouth as needed.     LORazepam (ATIVAN) 1 MG tablet Take 1 mg by mouth every 6 (six) hours as needed for anxiety.     Naproxen Sodium (ALEVE PO) as needed.     Nutritional Supplements (ENSURE MAX PROTEIN PO) Take by mouth daily. (Patient not taking: Reported on 12/06/2023)     omeprazole (PRILOSEC) 20 MG capsule Take 20 mg by mouth 2 (two) times daily before a meal.      RESTASIS 0.05 % ophthalmic emulsion Place into both eyes daily.      triamcinolone  cream (KENALOG ) 0.1 % APPLY TWICE DAILY AS NEEDED TO INVOLVED AREAS. THIS SHOULD BE TARGETED MAINLY AT [facility] OR INFLAMMED AREAS. ONLY USE AS NEEDED. DO NOT USE REGULARLY OR DAILY LIKE A MOISTURIZER. USE FOR PRESCRIBED PURPOSE AND THE DISCONTINUE WHEN THE PROBLEM RESOLVES.     vitamin E 400 UNIT capsule Take by mouth as needed.     zolpidem (AMBIEN) 10 MG tablet Take 1 tablet by mouth at bedtime.     No current facility-administered medications for this visit.     ASSESSMENT & PLAN:   Assessment/Plan:  ALVIA JABLONSKI is a 83 y.o. female with iron deficiency anemia likely due to chronic GI blood loss  as she reports melena.  I checked for hemolysis and celiac disease.  I explained the importance of GI evaluation to try to find a source of blood.   We rescheduled her appointment with [facility] and the first available was July 18.   She did not tolerate ferrous sulfate due to diarrhea.  I strongly recommended IV iron for rapid replacement of her iron, especially given that her hemoglobin has dropped half a gram in the past week so may need transfusion again in the near future.  Unfortunately, she declines IV iron.  She wishes to try the Hemocyte that Dr. Christia Cowboy has prescribed to see if she will tolerate this.  Due to the abdominal pain and umbilical nodule, I recommend a CT abdomen and pelvis and she agrees.  The patient states she had severe reaction to contrast years ago, so declines contrast even with premedication.  We will proceed with a noncontrasted CT.  I will plan to see her back in 2 weeks.  I discussed the assessment and recommendations with the patient and her family.  They were provided an opportunity to ask questions and all were answered.  The patient agreed with the plan and demonstrated an understanding of the instructions.    Thank you for the referral.    60 minutes was spent in patient care.  This included time spent preparing to see the patient (e.g., review of tests), obtaining and/or reviewing separately obtained history, counseling and educating the patient/family/caregiver, ordering medications, tests, or procedures; documenting clinical information in the electronic or other health record, independently interpreting results and communicating results to the patient/family/caregiver as well as coordination of care.      Alfonso Ike, PA-C   Physician Assistant [facility] [phone]

## 2023-12-08 NOTE — Telephone Encounter (Signed)
 Pt called stating she did not know anything about the referral that was sent to [facility] med oncology. Pt wanted to know why this referral was sent. Pt would like a call back

## 2023-12-11 ENCOUNTER — Telehealth: Payer: Self-pay | Admitting: Hematology and Oncology

## 2023-12-11 ENCOUNTER — Inpatient Hospital Stay: Attending: Hematology and Oncology | Admitting: Hematology and Oncology

## 2023-12-11 ENCOUNTER — Inpatient Hospital Stay

## 2023-12-11 ENCOUNTER — Encounter: Payer: Self-pay | Admitting: Hematology and Oncology

## 2023-12-11 VITALS — BP 189/58 | HR 66 | Temp 97.7°F | Resp 18 | Ht 61.0 in | Wt 105.8 lb

## 2023-12-11 DIAGNOSIS — M797 Fibromyalgia: Secondary | ICD-10-CM | POA: Diagnosis not present

## 2023-12-11 DIAGNOSIS — M3500 Sicca syndrome, unspecified: Secondary | ICD-10-CM | POA: Diagnosis not present

## 2023-12-11 DIAGNOSIS — Z79899 Other long term (current) drug therapy: Secondary | ICD-10-CM | POA: Insufficient documentation

## 2023-12-11 DIAGNOSIS — Z79890 Hormone replacement therapy (postmenopausal): Secondary | ICD-10-CM | POA: Insufficient documentation

## 2023-12-11 DIAGNOSIS — K219 Gastro-esophageal reflux disease without esophagitis: Secondary | ICD-10-CM | POA: Diagnosis not present

## 2023-12-11 DIAGNOSIS — R030 Elevated blood-pressure reading, without diagnosis of hypertension: Secondary | ICD-10-CM | POA: Diagnosis not present

## 2023-12-11 DIAGNOSIS — R197 Diarrhea, unspecified: Secondary | ICD-10-CM

## 2023-12-11 DIAGNOSIS — I7300 Raynaud's syndrome without gangrene: Secondary | ICD-10-CM | POA: Insufficient documentation

## 2023-12-11 DIAGNOSIS — R634 Abnormal weight loss: Secondary | ICD-10-CM | POA: Insufficient documentation

## 2023-12-11 DIAGNOSIS — K56699 Other intestinal obstruction unspecified as to partial versus complete obstruction: Secondary | ICD-10-CM | POA: Diagnosis not present

## 2023-12-11 DIAGNOSIS — D509 Iron deficiency anemia, unspecified: Secondary | ICD-10-CM | POA: Insufficient documentation

## 2023-12-11 DIAGNOSIS — M8580 Other specified disorders of bone density and structure, unspecified site: Secondary | ICD-10-CM | POA: Diagnosis not present

## 2023-12-11 DIAGNOSIS — E039 Hypothyroidism, unspecified: Secondary | ICD-10-CM | POA: Diagnosis not present

## 2023-12-11 DIAGNOSIS — M069 Rheumatoid arthritis, unspecified: Secondary | ICD-10-CM | POA: Diagnosis not present

## 2023-12-11 DIAGNOSIS — G629 Polyneuropathy, unspecified: Secondary | ICD-10-CM | POA: Diagnosis not present

## 2023-12-11 DIAGNOSIS — J45909 Unspecified asthma, uncomplicated: Secondary | ICD-10-CM | POA: Diagnosis not present

## 2023-12-11 LAB — CBC WITH DIFFERENTIAL (CANCER CENTER ONLY)
Abs Immature Granulocytes: 0.03 10*3/uL (ref 0.00–0.07)
Basophils Absolute: 0.1 10*3/uL (ref 0.0–0.1)
Basophils Relative: 1 %
Eosinophils Absolute: 0.1 10*3/uL (ref 0.0–0.5)
Eosinophils Relative: 2 %
HCT: 28.9 % — ABNORMAL LOW (ref 36.0–46.0)
Hemoglobin: 9.5 g/dL — ABNORMAL LOW (ref 12.0–15.0)
Immature Granulocytes: 0 %
Lymphocytes Relative: 27 %
Lymphs Abs: 1.8 10*3/uL (ref 0.7–4.0)
MCH: 29.7 pg (ref 26.0–34.0)
MCHC: 32.9 g/dL (ref 30.0–36.0)
MCV: 90.3 fL (ref 80.0–100.0)
Monocytes Absolute: 0.5 10*3/uL (ref 0.1–1.0)
Monocytes Relative: 8 %
Neutro Abs: 4.1 10*3/uL (ref 1.7–7.7)
Neutrophils Relative %: 62 %
Platelet Count: 408 10*3/uL — ABNORMAL HIGH (ref 150–400)
RBC: 3.2 MIL/uL — ABNORMAL LOW (ref 3.87–5.11)
RDW: 14.3 % (ref 11.5–15.5)
WBC Count: 6.7 10*3/uL (ref 4.0–10.5)
nRBC: 0 % (ref 0.0–0.2)

## 2023-12-11 LAB — FOLATE: Folate: 14.3 ng/mL (ref >5.9)

## 2023-12-11 LAB — DIRECT ANTIGLOBULIN TEST (NOT AT ARMC)
DAT, IgG: NEGATIVE
DAT, complement: NEGATIVE

## 2023-12-11 LAB — LACTATE DEHYDROGENASE: LDH: 155 U/L (ref 98–192)

## 2023-12-11 LAB — VITAMIN B12: Vitamin B-12: 454 pg/mL (ref 180–914)

## 2023-12-11 NOTE — Telephone Encounter (Signed)
 Patient has been scheduled for follow-up visit per 12/11/23 LOS.  Pt given an appt calendar with date and time.

## 2023-12-12 LAB — MAGNESIUM: Magnesium: 2.1 mg/dL (ref 1.5–2.5)

## 2023-12-12 LAB — CBC WITH DIFFERENTIAL/PLATELET
Absolute Lymphocytes: 2123 {cells}/uL (ref 850–3900)
Absolute Monocytes: 610 {cells}/uL (ref 200–950)
Basophils Absolute: 61 {cells}/uL (ref 0–200)
Basophils Relative: 1 %
Eosinophils Absolute: 98 {cells}/uL (ref 15–500)
Eosinophils Relative: 1.6 %
HCT: 33 % — ABNORMAL LOW (ref 35.0–45.0)
Hemoglobin: 10.2 g/dL — ABNORMAL LOW (ref 11.7–15.5)
MCH: 28.4 pg (ref 27.0–33.0)
MCHC: 30.9 g/dL — ABNORMAL LOW (ref 32.0–36.0)
MCV: 91.9 fL (ref 80.0–100.0)
MPV: 10 fL (ref 7.5–12.5)
Monocytes Relative: 10 %
Neutro Abs: 3209 {cells}/uL (ref 1500–7800)
Neutrophils Relative %: 52.6 %
Platelets: 468 10*3/uL — ABNORMAL HIGH (ref 140–400)
RBC: 3.59 10*6/uL — ABNORMAL LOW (ref 3.80–5.10)
RDW: 13.1 % (ref 11.0–15.0)
Total Lymphocyte: 34.8 %
WBC: 6.1 10*3/uL (ref 3.8–10.8)

## 2023-12-12 LAB — PROTEIN ELECTROPHORESIS, SERUM, WITH REFLEX
Albumin ELP: 4 g/dL (ref 3.8–4.8)
Alpha 1: 0.4 g/dL — ABNORMAL HIGH (ref 0.2–0.3)
Alpha 2: 0.9 g/dL (ref 0.5–0.9)
Beta 2: 0.3 g/dL (ref 0.2–0.5)
Beta Globulin: 0.5 g/dL (ref 0.4–0.6)
Gamma Globulin: 1.1 g/dL (ref 0.8–1.7)
Total Protein: 7.2 g/dL (ref 6.1–8.1)

## 2023-12-12 LAB — COMPREHENSIVE METABOLIC PANEL WITH GFR
AG Ratio: 1.4 (calc) (ref 1.0–2.5)
ALT: 6 U/L (ref 6–29)
AST: 17 U/L (ref 10–35)
Albumin: 4.3 g/dL (ref 3.6–5.1)
Alkaline phosphatase (APISO): 104 U/L (ref 37–153)
BUN/Creatinine Ratio: 18 (calc) (ref 6–22)
BUN: 18 mg/dL (ref 7–25)
CO2: 27 mmol/L (ref 20–32)
Calcium: 9.6 mg/dL (ref 8.6–10.4)
Chloride: 99 mmol/L (ref 98–110)
Creat: 1.01 mg/dL — ABNORMAL HIGH (ref 0.60–0.95)
Globulin: 3 g/dL (ref 1.9–3.7)
Glucose, Bld: 83 mg/dL (ref 65–99)
Potassium: 4.9 mmol/L (ref 3.5–5.3)
Sodium: 134 mmol/L — ABNORMAL LOW (ref 135–146)
Total Bilirubin: 0.4 mg/dL (ref 0.2–1.2)
Total Protein: 7.3 g/dL (ref 6.1–8.1)
eGFR: 56 mL/min/{1.73_m2} — ABNORMAL LOW (ref >=60)

## 2023-12-12 LAB — IRON,TIBC AND FERRITIN PANEL
%SAT: 8 % — ABNORMAL LOW (ref 16–45)
Ferritin: 11 ng/mL — ABNORMAL LOW (ref 16–288)
Iron: 32 ug/dL — ABNORMAL LOW (ref 45–160)
TIBC: 397 ug/dL (ref 250–450)

## 2023-12-12 LAB — HAPTOGLOBIN: Haptoglobin: 274 mg/dL (ref 42–346)

## 2023-12-12 LAB — IFE INTERPRETATION

## 2023-12-13 ENCOUNTER — Ambulatory Visit (HOSPITAL_BASED_OUTPATIENT_CLINIC_OR_DEPARTMENT_OTHER)
Admission: RE | Admit: 2023-12-13 | Discharge: 2023-12-13 | Discharge status: Home / Self Care | Source: Ambulatory Visit | Attending: Hematology and Oncology | Admitting: Hematology and Oncology

## 2023-12-13 DIAGNOSIS — R197 Diarrhea, unspecified: Secondary | ICD-10-CM

## 2023-12-13 DIAGNOSIS — R1011 Right upper quadrant pain: Secondary | ICD-10-CM | POA: Diagnosis not present

## 2023-12-13 LAB — CELIAC DISEASE PANEL
Endomysial Ab, IgA: NEGATIVE
IgA: 115 mg/dL (ref 64–422)
Tissue Transglutaminase Ab, IgA: <2 U/mL (ref 0–3)

## 2023-12-13 NOTE — Progress Notes (Signed)
 IFE normal.

## 2023-12-18 ENCOUNTER — Other Ambulatory Visit: Payer: Self-pay | Admitting: Physician Assistant

## 2023-12-18 DIAGNOSIS — Z79899 Other long term (current) drug therapy: Secondary | ICD-10-CM

## 2023-12-18 DIAGNOSIS — M0579 Rheumatoid arthritis with rheumatoid factor of multiple sites without organ or systems involvement: Secondary | ICD-10-CM

## 2023-12-18 NOTE — Telephone Encounter (Signed)
 Please clarify how she is taking plaquenil  currently.  Thank you!

## 2023-12-18 NOTE — Telephone Encounter (Signed)
 Patient is taking one tablet a day, 1/2 in the morning and 1/2 at night.

## 2023-12-18 NOTE — Telephone Encounter (Signed)
 Last Fill: 07/07/2023  Eye exam: 04/26/2023 WNL   Labs: 12/06/2023 Hemoglobin is low and stable.  Iron studies show low iron.  Magnesium  is normal.  Creatinine is mildly elevated.  Sodium is slightly low and stable.  SPEP pending.  Please forward results to her PCP.  We also referred her to hematology.   Next Visit: 05/07/2024  Last Visit: 12/06/2023  DX: Rheumatoid arthritis involving multiple sites with positive rheumatoid factor   Current Dose per office note 12/06/2023: Plaquenil  200 mg 1 tablet, half tablet p.o. twice daily   Okay to refill Plaquenil ?

## 2023-12-21 ENCOUNTER — Telehealth: Payer: Self-pay | Admitting: Hematology and Oncology

## 2023-12-21 DIAGNOSIS — D500 Iron deficiency anemia secondary to blood loss (chronic): Secondary | ICD-10-CM

## 2023-12-21 NOTE — Telephone Encounter (Signed)
 Telephoned patient to discuss CT results.  There appears to be narrowing at the anastomotic site which may be causing partial obstruction.  Advised her that I spoke with Dr. Jonita Neth, and he recommended GI specialist Dr.Darius Jeannette Mills at [facility], as he would be able to do balloon stretching of the anastomosis during the colonoscopy if needed.  She states had an appointment with Dr. Monico Anna, but he would not the office that day and she has not rescheduled.  She continues to have a bowel movement most days usually diarrhea.  She denies nausea and vomiting.  She has intermittent abdominal pain.  She has started the iron prescribed by Dr. Christia Cowboy and is only able to tolerate half a tablet daily, as she declined IV iron when I saw her for consultation.  She does not wish to undergo any procedures at this time.  I explained that we could not tell if there was bleeding based on the CT and worst-case scenario, she could have a cancer causing her blood loss.  She understands and still declines.  I will see her next week as scheduled for repeat CBC.

## 2023-12-25 ENCOUNTER — Inpatient Hospital Stay: Admitting: Hematology and Oncology

## 2023-12-25 ENCOUNTER — Telehealth: Payer: Self-pay | Admitting: Hematology and Oncology

## 2023-12-25 ENCOUNTER — Inpatient Hospital Stay

## 2023-12-25 ENCOUNTER — Encounter: Payer: Self-pay | Admitting: Hematology and Oncology

## 2023-12-25 VITALS — BP 203/80 | HR 63 | Temp 98.4°F | Resp 18 | Ht 61.0 in | Wt 107.9 lb

## 2023-12-25 DIAGNOSIS — D509 Iron deficiency anemia, unspecified: Secondary | ICD-10-CM | POA: Diagnosis not present

## 2023-12-25 DIAGNOSIS — D500 Iron deficiency anemia secondary to blood loss (chronic): Secondary | ICD-10-CM

## 2023-12-25 LAB — CBC WITH DIFFERENTIAL (CANCER CENTER ONLY)
Abs Immature Granulocytes: 0.02 10*3/uL (ref 0.00–0.07)
Basophils Absolute: 0 10*3/uL (ref 0.0–0.1)
Basophils Relative: 1 %
Eosinophils Absolute: 0.1 10*3/uL (ref 0.0–0.5)
Eosinophils Relative: 2 %
HCT: 30.5 % — ABNORMAL LOW (ref 36.0–46.0)
Hemoglobin: 9.7 g/dL — ABNORMAL LOW (ref 12.0–15.0)
Immature Granulocytes: 0 %
Lymphocytes Relative: 29 %
Lymphs Abs: 1.6 10*3/uL (ref 0.7–4.0)
MCH: 29.6 pg (ref 26.0–34.0)
MCHC: 31.8 g/dL (ref 30.0–36.0)
MCV: 93 fL (ref 80.0–100.0)
Monocytes Absolute: 0.6 10*3/uL (ref 0.1–1.0)
Monocytes Relative: 10 %
Neutro Abs: 3.4 10*3/uL (ref 1.7–7.7)
Neutrophils Relative %: 58 %
Platelet Count: 374 10*3/uL (ref 150–400)
RBC: 3.28 MIL/uL — ABNORMAL LOW (ref 3.87–5.11)
RDW: 15.4 % (ref 11.5–15.5)
WBC Count: 5.7 10*3/uL (ref 4.0–10.5)
nRBC: 0 % (ref 0.0–0.2)

## 2023-12-25 LAB — SAMPLE TO BLOOD BANK

## 2023-12-25 NOTE — Progress Notes (Unsigned)
 [facility]  [address] [place],  [place]  [address] [phone]  Clinic Day:  12/25/2023  Referring physician: Silver Lamar LABOR, MD   HISTORY OF PRESENT ILLNESS:  The patient is a 83 y.o. female with iron deficiency anemia.  She has a history of previous sigmoid colon obstruction status post sigmoid colectomy with temporary colostomy in June 2023.  She had colonoscopy in October 2023 that was negative. She then underwent laparoscopic reversal of her colostomy in November 2023.  She underwent cholecystectomy in June 2024.  When I saw her for consultation a few weeks ago she reported abdominal pain and diarrhea.  I obtained a CT abdomen and pelvis without contrast, as the patient declined contrast due to previous allergic reaction.  She was referred to [facility] for evaluation of her iron deficiency.  CT revealed narrowing at the anastomosis with possible partial colonic obstruction.  I discussed her case with Dr. Bert and he recommended GI at [facility] for the ability to do balloon procedure of the anastomosis if needed.  The patient initially declined.  She also declined IV iron.  She wanted to try the prescription iron Dr. Silver gave her first.    She is here for repeat clinical assessment.  She denies progressive fatigue concerning for worsening anemia.  She denies any overt form of blood loss.  She states she is taking the prescription iron daily without difficulty, except for dark stools.  Her systolic blood pressure is elevated today.  She states she is only taking half of her amlodipine  5 mg, as she did not tolerate the full dose.  VITALS:   Blood pressure (!) 203/80, pulse 63, temperature 98.4 F (36.9 C), temperature source Oral, resp. rate 18, height 5' 1 (1.549 m), weight 107 lb 14.4 oz (48.9 kg), SpO2 97%. Wt Readings from Last 3 Encounters:  12/25/23 107 lb 14.4 oz (48.9 kg)  12/11/23 105 lb 12.8 oz (48 kg)  12/06/23 107 lb 3.2 oz (48.6 kg)   Body  mass index is 20.39 kg/m.  Performance status (ECOG): 1 - Symptomatic but completely ambulatory  PHYSICAL EXAM:   Physical Exam Vitals and nursing note reviewed.  Constitutional:      General: She is not in acute distress.    Appearance: Normal appearance.  HENT:     Head: Normocephalic and atraumatic.     Mouth/Throat:     Mouth: Mucous membranes are moist.     Pharynx: Oropharynx is clear. No oropharyngeal exudate or posterior oropharyngeal erythema.   Eyes:     General: No scleral icterus.    Extraocular Movements: Extraocular movements intact.     Conjunctiva/sclera: Conjunctivae normal.     Pupils: Pupils are equal, round, and reactive to light.    Cardiovascular:     Rate and Rhythm: Normal rate and regular rhythm.     Heart sounds: Normal heart sounds. No murmur heard.    No friction rub. No gallop.  Pulmonary:     Effort: Pulmonary effort is normal.     Breath sounds: Normal breath sounds. No wheezing, rhonchi or rales.  Abdominal:     General: There is no distension.     Palpations: Abdomen is soft. There is no hepatomegaly, splenomegaly or mass.     Tenderness: There is no abdominal tenderness.     Comments: 1 cm non tender nodule of the umbilicus   Musculoskeletal:        General: Normal range of motion.  Cervical back: Normal range of motion and neck supple. No tenderness.     Right lower leg: No edema.     Left lower leg: No edema.  Lymphadenopathy:     Cervical: No cervical adenopathy.     Upper Body:     Right upper body: No supraclavicular or axillary adenopathy.     Left upper body: No supraclavicular or axillary adenopathy.     Lower Body: No right inguinal adenopathy. No left inguinal adenopathy.   Skin:    General: Skin is warm and dry.     Coloration: Skin is not jaundiced.     Findings: No rash.   Neurological:     Mental Status: She is alert and oriented to person, place, and time.     Cranial Nerves: No cranial nerve deficit.    Psychiatric:        Mood and Affect: Mood normal.        Behavior: Behavior normal.        Thought Content: Thought content normal.      LABS:      Latest Ref Rng & Units 12/25/2023    2:21 PM 12/11/2023    2:24 PM 12/06/2023    3:45 PM  CBC  WBC 4.0 - 10.5 K/uL 5.7  6.7  6.1   Hemoglobin 12.0 - 15.0 g/dL 9.7  9.5  89.7   Hematocrit 36.0 - 46.0 % 30.5  28.9  33.0   Platelets 150 - 400 K/uL 374  408  468       Latest Ref Rng & Units 12/06/2023    3:45 PM 07/06/2023    1:49 PM 01/24/2023   10:12 AM  CMP  Glucose 65 - 99 mg/dL 83  852  896   BUN 7 - 25 mg/dL 18  12  15    Creatinine 0.60 - 0.95 mg/dL 8.98  9.05  9.05   Sodium 135 - 146 mmol/L 134  136  134   Potassium 3.5 - 5.3 mmol/L 4.9  4.7  5.3   Chloride 98 - 110 mmol/L 99  101  96   CO2 20 - 32 mmol/L 27  27  24    Calcium 8.6 - 10.4 mg/dL 9.6  8.8  9.3   Total Protein 6.1 - 8.1 g/dL 6.1 - 8.1 g/dL 7.3    7.2  6.4  6.6   Total Bilirubin 0.2 - 1.2 mg/dL 0.4  0.3  <9.7   Alkaline Phos 44 - 121 IU/L   117   AST 10 - 35 U/L 17  16  18    ALT 6 - 29 U/L 6  7  7      Lab Results  Component Value Date   TOTALPROTELP 7.0 07/14/2016   ALBUMINELP 4.0 12/06/2023   A1GS 0.4 (H) 12/06/2023   A2GS 0.9 12/06/2023   BETS 0.5 12/06/2023   BETA2SER 0.3 12/06/2023   GAMS 1.1 12/06/2023   SPEI  12/06/2023     Comment:     . A poorly-defined band of restricted protein mobility is detected in the gamma globulins. It is unlikely that this may represent a monoclonal protein; however, immunofixation analysis is available if clinically indicated. .    Lab Results  Component Value Date   TIBC 397 12/06/2023   FERRITIN 11 (L) 12/06/2023   IRONPCTSAT 8 (L) 12/06/2023   Lab Results  Component Value Date   LDH 155 12/11/2023       Component Value Date/Time   TOTALPROTELP 7.0  07/14/2016 1523   ALBUMINELP 4.0 12/06/2023 1545   A1GS 0.4 (H) 12/06/2023 1545   A2GS 0.9 12/06/2023 1545   BETS 0.5 12/06/2023 1545   BETA2SER 0.3  12/06/2023 1545   GAMS 1.1 12/06/2023 1545   SPEI  12/06/2023 1545     Comment:     . A poorly-defined band of restricted protein mobility is detected in the gamma globulins. It is unlikely that this may represent a monoclonal protein; however, immunofixation analysis is available if clinically indicated. SABRA    LDH 155 12/11/2023 1424    Review Flowsheet  More data exists      Latest Ref Rng & Units 11/30/2021 12/06/2023 12/11/2023  Oncology Labs  Ferritin 16 - 288 ng/mL - 11  -  %SAT 16 - 45 % (calc) - 8  -  Albumin ELP 3.8 - 4.8 g/dL 4.1  4.0  -  Alpha-1 Globulin 0.2 - 0.3 g/dL 0.3  0.4  -  Alpha-2 Globulin 0.5 - 0.9 g/dL 0.7  0.9  -  Beta Globulin 0.4 - 0.6 g/dL 0.5  0.5  -  Beta 2 0.2 - 0.5 g/dL 0.3  0.3  -  Gamma Globulin 0.8 - 1.7 g/dL 0.8  1.1  -  SPE Interp. - --  --  -  LDH 98 - 192 U/L - - 155      STUDIES:   CT ABDOMEN PELVIS WO CONTRAST Result Date: 12/17/2023 CLINICAL DATA:  Right upper quadrant pain. Diarrhea. Previous cholecystectomy and colectomy. EXAM: CT ABDOMEN AND PELVIS WITHOUT CONTRAST TECHNIQUE: Multidetector CT imaging of the abdomen and pelvis was performed following the standard protocol without IV contrast. RADIATION DOSE REDUCTION: This exam was performed according to the departmental dose-optimization program which includes automated exposure control, adjustment of the mA and/or kV according to patient size and/or use of iterative reconstruction technique. COMPARISON:  05/17/2022 FINDINGS: Lower chest: No acute findings. Hepatobiliary: No mass visualized on this unenhanced exam. Prior cholecystectomy. No evidence of biliary obstruction. Pancreas: No mass or inflammatory process visualized on this unenhanced exam. Spleen:  Within normal limits in size. Adrenals/Urinary tract: No evidence of urolithiasis or hydronephrosis. Unremarkable unopacified urinary bladder. Stomach/Bowel: Postop changes seen from prior subtotal colectomy. Narrowing seen at site of  sigmoid colon anastomosis with mild dilatation of residual colon proximal to this point, suspicious for stricture. No dilated small bowel loops. No focal inflammatory process or abnormal fluid collections. No evidence of obstruction, inflammatory process, or abnormal fluid collections. Vascular/Lymphatic: No pathologically enlarged lymph nodes identified. No evidence of abdominal aortic aneurysm. Reproductive: Prior hysterectomy noted. Adnexal regions are unremarkable in appearance. Other:  None. Musculoskeletal:  No suspicious bone lesions identified. IMPRESSION: Prior subtotal colectomy. Narrowing at site of sigmoid colon anastomosis with mild dilatation of residual colon proximal to this point, suspicious for stricture with partial colonic obstruction. No small bowel dilatation or other acute findings. Electronically Signed   By: Norleen DELENA Kil M.D.   On: 12/17/2023 13:28      ASSESSMENT & PLAN:   Assessment/Plan:  83 y.o. female with iron deficiency anemia.  She declined IV iron, but states she is taking oral iron daily.  She is scheduled with [facility] on July 8.  She is willing to go to [facility] to see Dr. Alois at [facility] if needed. the patient understands all the plans discussed today and is in agreement with them.  She knows to contact our office if she develops concerns prior to her next appointment.  Andrez DELENA Foy, PA-C   Physician Assistant [facility] [phone]

## 2023-12-25 NOTE — Telephone Encounter (Signed)
 Patient has been scheduled for follow-up visit per 12/25/23 LOS.  Pt aware of scheduled appt details.

## 2023-12-26 DIAGNOSIS — D509 Iron deficiency anemia, unspecified: Secondary | ICD-10-CM | POA: Insufficient documentation

## 2023-12-29 ENCOUNTER — Telehealth: Payer: Self-pay

## 2023-12-29 NOTE — Addendum Note (Signed)
 Addended by: ANNIS BRUNO CROME on: 12/29/2023 01:45 PM   Modules accepted: Orders

## 2023-12-29 NOTE — Telephone Encounter (Signed)
 Referral faxed

## 2023-12-29 NOTE — Telephone Encounter (Signed)
-----   Message from Andrez DELENA Foy sent at 12/29/2023 11:44 AM EDT ----- Please refer to Dr. Darius Jahann at [facility] for iron deficiency anemia, narrowing at colonic anastomosis. I sent him an outside message, but I didn't know if we can refer in Epic. Thanks

## 2024-01-19 ENCOUNTER — Ambulatory Visit: Admitting: Physician Assistant

## 2024-01-26 ENCOUNTER — Ambulatory Visit (HOSPITAL_BASED_OUTPATIENT_CLINIC_OR_DEPARTMENT_OTHER)
Admission: EM | Admit: 2024-01-26 | Discharge: 2024-01-26 | Disposition: A | Discharge status: Home / Self Care | Attending: Family Medicine | Admitting: Family Medicine

## 2024-01-26 ENCOUNTER — Other Ambulatory Visit (HOSPITAL_BASED_OUTPATIENT_CLINIC_OR_DEPARTMENT_OTHER): Payer: Self-pay

## 2024-01-26 ENCOUNTER — Encounter (HOSPITAL_BASED_OUTPATIENT_CLINIC_OR_DEPARTMENT_OTHER): Payer: Self-pay

## 2024-01-26 DIAGNOSIS — R300 Dysuria: Secondary | ICD-10-CM | POA: Diagnosis not present

## 2024-01-26 DIAGNOSIS — R3989 Other symptoms and signs involving the genitourinary system: Secondary | ICD-10-CM

## 2024-01-26 DIAGNOSIS — R3911 Hesitancy of micturition: Secondary | ICD-10-CM

## 2024-01-26 LAB — POCT URINALYSIS DIP (MANUAL ENTRY)
Bilirubin, UA: NEGATIVE
Blood, UA: NEGATIVE
Glucose, UA: NEGATIVE mg/dL
Ketones, POC UA: NEGATIVE mg/dL
Leukocytes, UA: NEGATIVE
Nitrite, UA: NEGATIVE
Protein Ur, POC: NEGATIVE mg/dL
Spec Grav, UA: 1.01 (ref 1.010–1.025)
Urobilinogen, UA: 0.2 U/dL
pH, UA: 5.5 (ref 5.0–8.0)

## 2024-01-26 MED ORDER — NITROFURANTOIN MONOHYD MACRO 100 MG PO CAPS
100.0000 mg | ORAL_CAPSULE | Freq: Two times a day (BID) | ORAL | 0 refills | Status: AC
  Filled 2024-01-26: qty 10, 5d supply, fill #0

## 2024-01-26 NOTE — ED Triage Notes (Signed)
 Pt c/o uti symptoms. She has been having dysuria, lower back pain, and she feels like she needs to urinate but is unable to get anything out. She has been trying to stay hydrated.pt denies hematuria and pelvic pain. She has taken 2 doses of doxy yesterday that she had left over from a previous rx. Pt states she did feel like she was able to urinate better after taking the abx.

## 2024-01-26 NOTE — ED Provider Notes (Signed)
 [facility]    CSN: [id number] Arrival date & time: 01/26/24  1415      History   Chief Complaint Chief Complaint  Patient presents with   Dysuria    HPI Brenda Hanna is a 83 y.o. female.   84 year old female here with her daughter and husband.  They report that she is having some bowel problems and had a colonoscopy earlier this week and was very dehydrated by the bowel prep.  She developed dysuria, urinary hesitancy, lower back pain and bladder pain on 01/23/2024 or earlier.  She had doxycycline 100 mg and she took 2 doses yesterday.  She felt much better after taking the doxycycline and felt able to empty her bladder.   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   High risk medication use 07/14/2016   Sjogren's syndrome with keratoconjunctivitis sicca (HCC) 07/14/2016   Raynaud's disease without gangrene 07/14/2016   Achilles tendinitis of both lower extremities 07/14/2016   Fibromyalgia 07/14/2016   Anxiety and depression 07/14/2016   History of asthma 07/14/2016   Environmental allergies 07/14/2016   Gastroesophageal reflux disease without esophagitis 07/14/2016   Neuropathy 07/14/2016   Osteopenia of multiple sites 07/14/2016   Pain in both hands 07/14/2016   Pain in both feet 07/14/2016    Past Surgical History:  Procedure Laterality Date   ABDOMINAL HYSTERECTOMY     BLADDER SURGERY     CHOLECYSTECTOMY   12/09/2022   COLOSTOMY REVERSAL  05/04/2022   dental implants  2022   intestinal blockage surgery  12/28/2021   colostomy bag    OB History   No obstetric history on file.      Home Medications    Prior to Admission medications   Medication Sig Start Date End Date Taking? Authorizing Provider  nitrofurantoin, macrocrystal-monohydrate, (MACROBID) 100 MG capsule Take 1 capsule (100 mg total) by mouth 2 (two) times daily for 5 days. 01/26/24 01/31/24 Yes Ival Domino, FNP  Acetaminophen  (TYLENOL  PO) Take by mouth as needed.    [provider]  Biotin 10 MG CAPS Take 10 mg by mouth daily.    [provider]  CALCIUM-MAGNESIUM -ZINC PO Take 1 tablet by mouth daily.    [provider]  cetirizine (ZYRTEC) 10 MG tablet Take 10 mg by mouth daily.    [provider]  Cyanocobalamin  (VITAMIN B-12 PO)     [provider]  Ferrous Fumarate (HEMOCYTE - 106 MG FE) 324 (106 Fe) MG TABS tablet Take 106 mg of iron by mouth daily. 12/08/23   [provider]  finasteride (PROSCAR) 5 MG tablet Take 5 mg by mouth daily. 1/2 tab 09/01/23   [provider]  hydroxychloroquine  (PLAQUENIL ) 200 MG tablet Take 1 tablet by mouth once daily 12/18/23   Cheryl Waddell CHRISTELLA, PA-C  ketoconazole (NIZORAL) 2 % shampoo SMARTSIG:1 Topical Once a  Week 11/29/23   [provider]  levothyroxine (SYNTHROID, LEVOTHROID) 75 MCG tablet Take 75 mcg by mouth daily before breakfast.  07/05/16   [provider]  loperamide (IMODIUM) 2 MG capsule Take 2 mg by mouth as needed. 11/30/22   [provider]  LORazepam (ATIVAN) 1 MG tablet Take 1 mg by mouth every 6 (six) hours as needed for anxiety. 02/20/17   [provider]  Naproxen Sodium (ALEVE PO) as needed.    [provider]  Nutritional Supplements (ENSURE MAX PROTEIN PO) Take by mouth daily. Patient not taking: Reported on 12/06/2023    [provider]  omeprazole (PRILOSEC) 20  MG capsule Take 20 mg by mouth 2 (two) times daily before a meal.  07/05/16   [provider]  RESTASIS 0.05 % ophthalmic emulsion Place into both eyes daily.  12/01/16   [provider]  tiZANidine (ZANAFLEX) 2 MG tablet Take 2 mg by mouth at bedtime. 12/08/23   [provider]  triamcinolone  cream (KENALOG ) 0.1 % APPLY TWICE DAILY AS NEEDED TO INVOLVED AREAS. THIS SHOULD BE TARGETED MAINLY AT [employer] OR INFLAMMED AREAS. ONLY USE AS NEEDED. DO NOT USE REGULARLY OR DAILY LIKE A MOISTURIZER. USE FOR PRESCRIBED PURPOSE AND THE DISCONTINUE WHEN THE PROBLEM RESOLVES. Patient not taking: Reported on 12/25/2023 04/14/23   [provider]  vitamin E 400 UNIT capsule Take by mouth as needed.    [provider]  zolpidem (AMBIEN) 10 MG tablet Take 1 tablet by mouth at bedtime. 05/28/20   [provider]    Family History Family History  Problem Relation Age of Onset   Heart failure Mother    Neuropathy Father    Heart failure Father    Healthy Daughter    Healthy Daughter    Healthy Son     Social History Social History   Tobacco Use   Smoking status: Never    Passive exposure: Never   Smokeless tobacco: Never  Vaping Use   Vaping status: Never Used  Substance Use Topics   Alcohol use: No   Drug use: No     Allergies   Methotrexate, Contrast media [iodinated contrast media], Penicillins, and Shellfish allergy   Review of Systems Review of Systems  Constitutional:  Negative for chills and fever.  HENT:  Negative for ear pain and sore throat.   Eyes:  Negative for pain and visual disturbance.  Respiratory:  Negative for cough and shortness of breath.   Cardiovascular:  Negative for chest pain and palpitations.  Gastrointestinal:  Positive for abdominal pain. Negative for constipation, diarrhea, nausea and vomiting.  Genitourinary:  Positive for dysuria and pelvic pain. Negative for hematuria.  Musculoskeletal:  Negative for arthralgias  and back pain.  Skin:  Negative for color change and rash.  Neurological:  Negative for seizures and syncope.  All other systems reviewed and are negative.    Physical Exam Triage Vital Signs ED Triage Vitals  Encounter Vitals Group     BP 01/26/24 1429 125/65     Girls Systolic BP Percentile --      Girls Diastolic BP Percentile --      Boys Systolic BP Percentile --      Boys Diastolic BP Percentile --      Pulse Rate 01/26/24 1429 74     Resp 01/26/24 1429 20     Temp 01/26/24 1429 97.7 F (36.5 C)     Temp Source 01/26/24 1429 Oral  SpO2 01/26/24 1429 96 %     Weight --      Height --      Head Circumference --      Peak Flow --      Pain Score 01/26/24 1427 8     Pain Loc --      Pain Education --      Exclude from Growth Chart --    No data found.  Updated Vital Signs BP 125/65 (BP Location: Right Arm)   Pulse 74   Temp 97.7 F (36.5 C) (Oral)   Resp 20   SpO2 96%   Visual Acuity Right Eye Distance:   Left Eye Distance:   Bilateral Distance:    Right Eye Near:   Left Eye Near:    Bilateral Near:     Physical Exam Vitals and nursing note reviewed.  Constitutional:      General: She is not in acute distress.    Appearance: She is well-developed. She is not ill-appearing or toxic-appearing.  HENT:     Head: Normocephalic and atraumatic.     Right Ear: Hearing, tympanic membrane, ear canal and external ear normal.     Left Ear: Hearing, tympanic membrane, ear canal and external ear normal.     Nose: No congestion or rhinorrhea.     Right Sinus: No maxillary sinus tenderness or frontal sinus tenderness.     Left Sinus: No maxillary sinus tenderness or frontal sinus tenderness.     Mouth/Throat:     Lips: Pink.     Mouth: Mucous membranes are moist.     Pharynx: Uvula midline. No oropharyngeal exudate or posterior oropharyngeal erythema.     Tonsils: No tonsillar exudate.  Eyes:     Conjunctiva/sclera: Conjunctivae normal.     Pupils: Pupils are  equal, round, and reactive to light.  Cardiovascular:     Rate and Rhythm: Normal rate and regular rhythm.     Heart sounds: S1 normal and S2 normal. No murmur heard. Pulmonary:     Effort: Pulmonary effort is normal. No respiratory distress.     Breath sounds: Normal breath sounds. No decreased breath sounds, wheezing, rhonchi or rales.  Abdominal:     General: Bowel sounds are normal.     Palpations: Abdomen is soft.     Tenderness: There is abdominal tenderness in the suprapubic area, left upper quadrant and left lower quadrant. There is no right CVA tenderness, left CVA tenderness, guarding or rebound. Negative signs include Murphy's sign, Rovsing's sign and McBurney's sign.  Musculoskeletal:        General: No swelling.     Cervical back: Neck supple.  Lymphadenopathy:     Head:     Right side of head: No submental, submandibular, tonsillar, preauricular or posterior auricular adenopathy.     Left side of head: No submental, submandibular, tonsillar, preauricular or posterior auricular adenopathy.     Cervical: No cervical adenopathy.     Right cervical: No superficial cervical adenopathy.    Left cervical: No superficial cervical adenopathy.  Skin:    General: Skin is warm and dry.     Capillary Refill: Capillary refill takes less than 2 seconds.     Findings: No rash.  Neurological:     Mental Status: She is alert and oriented to person, place, and time.  Psychiatric:        Mood and Affect: Mood normal.      UC Treatments / Results  Labs (all labs  ordered are listed, but only abnormal results are displayed) Labs Reviewed  POCT URINALYSIS DIP (MANUAL ENTRY) - Normal    EKG   Radiology No results found.  Procedures Procedures (including critical care time)  Medications Ordered in UC Medications - No data to display  Initial Impression / Assessment and Plan / UC Course  I have reviewed the triage vital signs and the nursing notes.  Pertinent labs & imaging  results that were available during my care of the patient were reviewed by me and considered in my medical decision making (see chart for details).  Plan of Care: Dysuria, bladder pain, urinary hesitancy: Will treat for UTI based on her symptoms but her urine is completely normal.  She took doxycycline twice yesterday.  Her urine has basically been sterilized.  Culture not done.  Nitrofurantoin 100 mg twice daily for 5 days.  Get plenty of fluids and rest.  Advised that if this does not solve her problems, she will need to be 3 to 5 days without antibiotics and follow-up here or primary care for recheck urinalysis.  Advised that in the future if her urine is normal due to recent antibiotic use.  We will send a culture but we will not be able to treat her unless the culture is abnormal.  I will not be able to treat her in the future with a totally normal urine, only treating the symptoms.   I reviewed the plan of care with the patient and/or the patient's guardian.  The patient and/or guardian had time to ask questions and acknowledged that the questions were answered.  I provided instruction on symptoms or reasons to return here or to go to an ER, if symptoms/condition did not improve, worsened or if new symptoms occurred.  Final Clinical Impressions(s) / UC Diagnoses   Final diagnoses:  Bladder pain  Dysuria  Urinary hesitancy     Discharge Instructions      Dysuria, bladder pain and urinary hesitancy.  Patient feels that she has an acute UTI but she took doxycycline 100 mg 1 pill twice a day for total of 2 doses yesterday.  Her urine is completely normal today.  She continues with her symptoms.  Will treat with nitrofurantoin 100 mg twice daily for 5 days.  Culture not done due to recent antibiotic use.  If symptoms do not improve, worsen or new symptoms occur.  Needs to be off the antibiotic for 3 to 5 days and return here or to primary care for urinalysis and further evaluation for  UTI.     ED Prescriptions     Medication Sig Dispense Auth. Provider   nitrofurantoin, macrocrystal-monohydrate, (MACROBID) 100 MG capsule Take 1 capsule (100 mg total) by mouth 2 (two) times daily for 5 days. 10 capsule Ival Domino, FNP      PDMP not reviewed this encounter.   Ival Domino, FNP 01/26/24 1538

## 2024-01-26 NOTE — Discharge Instructions (Signed)
 Dysuria, bladder pain and urinary hesitancy.  Patient feels that she has an acute UTI but she took doxycycline 100 mg 1 pill twice a day for total of 2 doses yesterday.  Her urine is completely normal today.  She continues with her symptoms.  Will treat with nitrofurantoin 100 mg twice daily for 5 days.  Culture not done due to recent antibiotic use.  If symptoms do not improve, worsen or new symptoms occur.  Needs to be off the antibiotic for 3 to 5 days and return here or to primary care for urinalysis and further evaluation for UTI.

## 2024-02-05 ENCOUNTER — Inpatient Hospital Stay: Admitting: Hematology and Oncology

## 2024-02-05 ENCOUNTER — Inpatient Hospital Stay

## 2024-04-24 NOTE — Progress Notes (Deleted)
 Office Visit Note  Patient: Brenda Hanna             Date of Birth: [birth year]           MRN: [id number]             PCP: Simpers Lamar LABOR, MD Referring: Simpers Lamar LABOR, MD Visit Date: 05/07/2024 Occupation: Data Unavailable  Subjective:  No chief complaint on file.   History of Present Illness: Brenda Hanna is a 83 y.o. female ***     Activities of Daily Living:  Patient reports morning stiffness for *** {minute/hour:19697}.   Patient {ACTIONS;DENIES/REPORTS:21021675::Denies} nocturnal pain.  Difficulty dressing/grooming: {ACTIONS;DENIES/REPORTS:21021675::Denies} Difficulty climbing stairs: {ACTIONS;DENIES/REPORTS:21021675::Denies} Difficulty getting out of chair: {ACTIONS;DENIES/REPORTS:21021675::Denies} Difficulty using hands for taps, buttons, cutlery, and/or writing: {ACTIONS;DENIES/REPORTS:21021675::Denies}  No Rheumatology ROS completed.   PMFS History:  Patient Active Problem List   Diagnosis Date Noted   Iron deficiency anemia 12/26/2023   Rheumatoid arthritis involving multiple sites with positive rheumatoid factor (HCC) 07/14/2016   High risk medication use 07/14/2016   Sjogren's syndrome with keratoconjunctivitis sicca 07/14/2016   Raynaud's disease without gangrene 07/14/2016   Achilles tendinitis of both lower extremities 07/14/2016   Fibromyalgia 07/14/2016   Anxiety and depression 07/14/2016   History of asthma 07/14/2016   Environmental allergies 07/14/2016   Gastroesophageal reflux disease without esophagitis 07/14/2016   Neuropathy 07/14/2016   Osteopenia of multiple sites 07/14/2016   Pain in both hands 07/14/2016   Pain in both feet 07/14/2016    Past Medical History:  Diagnosis Date   Anemia    Anxiety    Back pain    Fibromyalgia    GERD (gastroesophageal reflux disease)    Hypothyroidism    Osteoarthritis    Osteopenia    Rheumatoid arteritis (HCC)    Ruptured lumbar disc    Seasonal allergies    Sjogren's  disease     Family History  Problem Relation Age of Onset   Heart failure Mother    Neuropathy Father    Heart failure Father    Healthy Daughter    Healthy Daughter    Healthy Son    Past Surgical History:  Procedure Laterality Date   ABDOMINAL HYSTERECTOMY     BLADDER SURGERY     CHOLECYSTECTOMY  12/09/2022   COLOSTOMY REVERSAL  05/04/2022   dental implants  2022   intestinal blockage surgery  12/28/2021   colostomy bag   Social History   Tobacco Use   Smoking status: Never    Passive exposure: Never   Smokeless tobacco: Never  Vaping Use   Vaping status: Never Used  Substance Use Topics   Alcohol use: No   Drug use: No   Social History   Social History Narrative   Right Handed    Two story home     Immunization History  Administered Date(s) Administered   INFLUENZA, HIGH DOSE SEASONAL PF 04/06/2017   PFIZER(Purple Top)SARS-COV-2 Vaccination 07/19/2019, 08/09/2019, 06/04/2020, 02/10/2021   Pfizer Covid-19 Vaccine Bivalent Booster 34yrs & up 06/02/2021   Pfizer(Comirnaty)Fall Seasonal Vaccine 12 years and older 06/05/2023   Zoster Recombinant(Shingrix) 01/31/2018     Objective: Vital Signs: There were no vitals taken for this visit.   Physical Exam   Musculoskeletal Exam: ***  CDAI Exam: CDAI Score: -- Patient Global: --; Provider Global: -- Swollen: --; Tender: -- Joint Exam 05/07/2024   No joint exam has been documented for this visit   There is currently no information documented on the  homunculus. Go to the Rheumatology activity and complete the homunculus joint exam.  Investigation: No additional findings.  Imaging: No results found.  Recent Labs: Lab Results  Component Value Date   WBC 5.7 12/25/2023   HGB 9.7 (L) 12/25/2023   PLT 374 12/25/2023   NA 134 (L) 12/06/2023   K 4.9 12/06/2023   CL 99 12/06/2023   CO2 27 12/06/2023   GLUCOSE 83 12/06/2023   BUN 18 12/06/2023   CREATININE 1.01 (H) 12/06/2023   BILITOT 0.4  12/06/2023   ALKPHOS 117 01/24/2023   AST 17 12/06/2023   ALT 6 12/06/2023   PROT 7.3 12/06/2023   PROT 7.2 12/06/2023   ALBUMIN 3.8 01/24/2023   CALCIUM 9.6 12/06/2023   GFRAA 82 12/23/2020    Speciality Comments: [facility] eye exam 02/21/2024 WNL  [facility] f/u 1 year  Procedures:  No procedures performed Allergies: Methotrexate, Contrast media [iodinated contrast media], Penicillins, and Shellfish allergy   Assessment / Plan:     Visit Diagnoses: No diagnosis found.  Orders: No orders of the defined types were placed in this encounter.  No orders of the defined types were placed in this encounter.   Face-to-face time spent with patient was *** minutes. Greater than 50% of time was spent in counseling and coordination of care.  Follow-Up Instructions: No follow-ups on file.   Daved JAYSON Gavel, CMA  Note - This record has been created using Animal nutritionist.  Chart creation errors have been sought, but may not always  have been located. Such creation errors do not reflect on  the standard of medical care.

## 2024-05-07 ENCOUNTER — Ambulatory Visit: Admitting: Rheumatology

## 2024-05-07 DIAGNOSIS — K529 Noninfective gastroenteritis and colitis, unspecified: Secondary | ICD-10-CM

## 2024-05-07 DIAGNOSIS — M19041 Primary osteoarthritis, right hand: Secondary | ICD-10-CM

## 2024-05-07 DIAGNOSIS — M3501 Sicca syndrome with keratoconjunctivitis: Secondary | ICD-10-CM

## 2024-05-07 DIAGNOSIS — Z79899 Other long term (current) drug therapy: Secondary | ICD-10-CM

## 2024-05-07 DIAGNOSIS — M797 Fibromyalgia: Secondary | ICD-10-CM

## 2024-05-07 DIAGNOSIS — G629 Polyneuropathy, unspecified: Secondary | ICD-10-CM

## 2024-05-07 DIAGNOSIS — M0579 Rheumatoid arthritis with rheumatoid factor of multiple sites without organ or systems involvement: Secondary | ICD-10-CM

## 2024-05-07 DIAGNOSIS — M8589 Other specified disorders of bone density and structure, multiple sites: Secondary | ICD-10-CM

## 2024-05-07 DIAGNOSIS — D649 Anemia, unspecified: Secondary | ICD-10-CM

## 2024-05-07 DIAGNOSIS — G8929 Other chronic pain: Secondary | ICD-10-CM

## 2024-05-07 DIAGNOSIS — M51369 Other intervertebral disc degeneration, lumbar region without mention of lumbar back pain or lower extremity pain: Secondary | ICD-10-CM

## 2024-05-07 DIAGNOSIS — I7300 Raynaud's syndrome without gangrene: Secondary | ICD-10-CM

## 2024-05-07 DIAGNOSIS — R252 Cramp and spasm: Secondary | ICD-10-CM
